# Patient Record
Sex: Female | Born: 1995 | Race: Black or African American | Hispanic: No | Marital: Single | Smoking: Never smoker
Health system: Southern US, Community
[De-identification: ages and names within clinical notes are randomized; demographics above are authoritative.]

## PROBLEM LIST (undated history)

## (undated) ENCOUNTER — Inpatient Hospital Stay (HOSPITAL_COMMUNITY): Payer: Self-pay

## (undated) DIAGNOSIS — K219 Gastro-esophageal reflux disease without esophagitis: Secondary | ICD-10-CM

## (undated) DIAGNOSIS — E119 Type 2 diabetes mellitus without complications: Secondary | ICD-10-CM

## (undated) DIAGNOSIS — R519 Headache, unspecified: Secondary | ICD-10-CM

## (undated) DIAGNOSIS — O24419 Gestational diabetes mellitus in pregnancy, unspecified control: Secondary | ICD-10-CM

## (undated) DIAGNOSIS — R51 Headache: Secondary | ICD-10-CM

## (undated) DIAGNOSIS — N189 Chronic kidney disease, unspecified: Secondary | ICD-10-CM

## (undated) DIAGNOSIS — Z789 Other specified health status: Secondary | ICD-10-CM

## (undated) HISTORY — DX: Other specified health status: Z78.9

## (undated) HISTORY — PX: NO PAST SURGERIES: SHX2092

---

## 2015-12-21 ENCOUNTER — Emergency Department (HOSPITAL_COMMUNITY): Payer: Self-pay

## 2015-12-21 ENCOUNTER — Emergency Department (HOSPITAL_COMMUNITY)
Admission: EM | Admit: 2015-12-21 | Discharge: 2015-12-21 | Disposition: A | Discharge status: Home / Self Care | Payer: Self-pay | Attending: Emergency Medicine | Admitting: Emergency Medicine

## 2015-12-21 ENCOUNTER — Encounter (HOSPITAL_COMMUNITY): Payer: Self-pay

## 2015-12-21 DIAGNOSIS — J039 Acute tonsillitis, unspecified: Secondary | ICD-10-CM | POA: Insufficient documentation

## 2015-12-21 LAB — RAPID STREP SCREEN (MED CTR MEBANE ONLY): Streptococcus, Group A Screen (Direct): NEGATIVE

## 2015-12-21 MED ORDER — IBUPROFEN 600 MG PO TABS: 600.0000 mg | ORAL_TABLET | Freq: Four times a day (QID) | ORAL | 0 refills | Status: DC | PRN

## 2015-12-21 MED ORDER — IOPAMIDOL (ISOVUE-300) INJECTION 61%
100.0000 mL | Freq: Once | INTRAVENOUS | Status: AC | PRN
  Administered 2015-12-21: 75 mL via INTRAVENOUS

## 2015-12-21 MED ORDER — AMOXICILLIN 500 MG PO CAPS: 500.0000 mg | ORAL_CAPSULE | Freq: Three times a day (TID) | ORAL | 0 refills | Status: AC

## 2015-12-21 NOTE — ED Triage Notes (Signed)
Pt reports sore throat, hot and cold flashes, and cough for the past few days.  Denies fever.

## 2015-12-21 NOTE — ED Provider Notes (Signed)
[facility] Provider Note   CSN: [id number] Arrival date & time: 12/21/15  4010  First Provider Contact:  First MD Initiated Contact with Patient 12/21/15 [phone]        History   Chief Complaint Chief Complaint  Patient presents with  . Sore Throat    HPI Alison Sandoval is a 20 y.o. female.  Alison Sandoval is a 20 y.o. Female presenting for evaluation of a recurrent episode of sore throat along with enlarged tonsils which started several days ago.  She denies fevers but has had intermittent hot and cold flashes.  She also endorses clear rhinorrhea with nasal congestion, occasional itchy watery eyes, and shortness of breath along with cough which has been nonproductive.  Her shortness of breath worsens when she is supine and she also describes a foreign body sensation like her throat is closing which worsens when she lies flat.  Past medical history is significant for childhood seasonal allergies and frequent episodes of tonsillitis.  Her last episode was approximately 4 months ago.  She has had no medications for her current symptoms.  She has found no alleviators.       The history is provided by the patient.    History reviewed. No pertinent past medical history.  There are no active problems to display for this patient.   History reviewed. No pertinent surgical history.  OB History    No data available       Home Medications    Prior to Admission medications   Medication Sig Start Date End Date Taking? Authorizing Provider  amoxicillin (AMOXIL) 500 MG capsule Take 1 capsule (500 mg total) by mouth 3 (three) times daily. 12/21/15 12/31/15  Burgess Amor, PA-C  ibuprofen (ADVIL,MOTRIN) 600 MG tablet Take 1 tablet (600 mg total) by mouth every 6 (six) hours as needed. 12/21/15   Burgess Amor, PA-C    Family History No family history on file.  Social History Social History  Substance Use Topics  . Smoking status: Never Smoker  . Smokeless tobacco: Never Used  .  Alcohol use No     Allergies   Review of patient's allergies indicates no known allergies.   Review of Systems Review of Systems  Constitutional: Negative for fever.  HENT: Positive for congestion, postnasal drip, rhinorrhea and sore throat. Negative for ear pain, facial swelling, trouble swallowing and voice change.   Eyes: Negative.   Respiratory: Negative for chest tightness and shortness of breath.   Cardiovascular: Negative for chest pain.  Gastrointestinal: Negative for abdominal pain and nausea.  Genitourinary: Negative.   Musculoskeletal: Negative for arthralgias, joint swelling and neck pain.  Skin: Negative.  Negative for rash and wound.  Neurological: Negative for dizziness, weakness, light-headedness, numbness and headaches.  Psychiatric/Behavioral: Negative.      Physical Exam Updated Vital Signs BP 122/76 (BP Location: Left Arm)   Pulse 102   Temp 97.7 F (36.5 C) (Oral)   Resp 20   Ht  (1.6 m)   Wt 99.3 kg   LMP  (LMP Unknown)   SpO2 94%   BMI 38.79 kg/m   Physical Exam  Constitutional: She is oriented to person, place, and time. She appears well-developed and well-nourished.  HENT:  Head: Normocephalic and atraumatic.  Right Ear: Tympanic membrane and ear canal normal.  Left Ear: Tympanic membrane and ear canal normal.  Nose: Mucosal edema and rhinorrhea present.  Mouth/Throat: Uvula is midline, oropharynx is clear and moist and mucous membranes are normal. No uvula  swelling. No oropharyngeal exudate, posterior oropharyngeal edema, posterior oropharyngeal erythema or tonsillar abscesses. Tonsils are 2+ on the right. Tonsils are 2+ on the left. No tonsillar exudate.  Bilateral symmetric hypertrophied tonsils without erythema or exudate.  Eyes: Conjunctivae are normal.  Cardiovascular: Normal rate and normal heart sounds.   Pulmonary/Chest: Effort normal. No respiratory distress. She has decreased breath sounds. She has no wheezes. She has no  rhonchi. She has no rales.  Decreased breath sounds throughout all lung fields.  No wheezing or rhonchi appreciated.  No stridor.  Abdominal: Soft. There is no tenderness.  Musculoskeletal: Normal range of motion.  Neurological: She is alert and oriented to person, place, and time.  Skin: Skin is warm and dry. No rash noted.  Psychiatric: She has a normal mood and affect.     ED Treatments / Results  Labs (all labs ordered are listed, but only abnormal results are displayed) Labs Reviewed  RAPID STREP SCREEN (NOT AT [facility]  CULTURE, GROUP A STREP [facility])  POC URINE PREG, ED    EKG  EKG Interpretation None       Radiology Dg Neck Soft Tissue  Result Date: 12/21/2015 CLINICAL DATA:  Sore throat. EXAM: NECK SOFT TISSUES - 1+ VIEW COMPARISON:  None. FINDINGS: The vallecular, epiglottis, prevertebral soft tissues, and piriform sinuses are normal. There is rounded soft tissue attenuation along the posterior aspect of the soft palate, in the expected location of the uvula. No other abnormalities. IMPRESSION: Rounded soft tissue attenuation along the posterior aspect of the soft palate. If not artifactual, this should be visible on physical exam. If there is continued concern, CT imaging could better evaluate this region. The vallecular, epiglottis, prevertebral soft tissues, and piriform sinuses are normal. Electronically Signed   By: Gerome Sam III M.D   On: 12/21/2015 11:03  Dg Chest 2 View  Result Date: 12/21/2015 CLINICAL DATA:  Cough and shortness of breath EXAM: CHEST  2 VIEW COMPARISON:  None. FINDINGS: The heart size and mediastinal contours are within normal limits. Both lungs are clear. The visualized skeletal structures are unremarkable. IMPRESSION: No active cardiopulmonary disease. Electronically Signed   By: Gerome Sam III M.D   On: 12/21/2015 10:57  Ct Soft Tissue Neck W Contrast  Result Date: 12/21/2015 CLINICAL DATA:  Globus sensation. EXAM: CT NECK WITH  CONTRAST TECHNIQUE: Multidetector CT imaging of the neck was performed using the standard protocol following the bolus administration of intravenous contrast. CONTRAST:  84mL ISOVUE-300 IOPAMIDOL (ISOVUE-300) INJECTION 61% COMPARISON:  None. FINDINGS: Pharynx and larynx: Marked enlargement of the adenoid and palatine tonsils, symmetric. Negative for abscess. No prevertebral edema. Salivary glands: Negative Thyroid: Negative Lymph nodes: Enlarged and rounded upper cervical lymph nodes without cavitation. Vascular: Negative Limited intracranial: Negative Visualized orbits: Negative Mastoids and visualized paranasal sinuses: Mild scattered mucosal thickening in the paranasal sinuses with mucus/ debris in the right maxillary antrum. Skeleton: Benign sclerosis in the bilateral mandible Upper chest: Prevascular soft tissue density is compatible with thymus. Thickness at the visualized level is allowable for age and there is no discrete nodule. Chest x-ray shows no anterior mediastinal mass or hilar adenopathy. IMPRESSION: Marked tonsillar enlargement with cervical adenopathy. Correlate for URI symptoms and recommend follow-up for stability or normalization. Electronically Signed   By: Marnee Spring M.D.   On: 12/21/2015 12:21   Procedures Procedures (including critical care time)  Medications Ordered in ED Medications  iopamidol (ISOVUE-300) 61 % injection 100 mL (75 mLs Intravenous Contrast Given 12/21/15 1158)  Initial Impression / Assessment and Plan / ED Course  I have reviewed the triage vital signs and the nursing notes.  Pertinent labs & imaging results that were available during my care of the patient were reviewed by me and considered in my medical decision making (see chart for details).  Clinical Course    Plain film imaging reviewed and discussed with Dr. Deretha Emory.  Suspected tonsillar enlargement as source of soft tissue attenuation of plan films.  CT imaging obtained to rule out  abscess or other source of swelling and negative for emergent findings.  She was placed on amoxil, ibuprofen for pain and inflammation.  Prior to dc, mother at bedside confirming frequent tonsil infections and that the patient has a bad snoring habit.  Recommended eval by ENT for further management and to determine if tonsillectomy would be appropriate for this patient.  Mother and pt agree with this plan.  Referral to Dr. Suszanne Conners given.   Final Clinical Impressions(s) / ED Diagnoses   Final diagnoses:  Acute tonsillitis, unspecified etiology    New Prescriptions Discharge Medication List as of 12/21/2015  1:11 PM    START taking these medications   Details  amoxicillin (AMOXIL) 500 MG capsule Take 1 capsule (500 mg total) by mouth 3 (three) times daily., Starting Mon 12/21/2015, Until Thu 12/31/2015, Print    ibuprofen (ADVIL,MOTRIN) 600 MG tablet Take 1 tablet (600 mg total) by mouth every 6 (six) hours as needed., Starting Mon 12/21/2015, Print         Burgess Amor, PA-C 12/21/15 1800    Vanetta Mulders, MD 12/22/15 [phone]

## 2015-12-23 LAB — CULTURE, GROUP A STREP (THRC)

## 2016-05-18 ENCOUNTER — Encounter (HOSPITAL_COMMUNITY): Payer: Self-pay | Admitting: Emergency Medicine

## 2016-05-18 ENCOUNTER — Emergency Department (HOSPITAL_COMMUNITY)
Admission: EM | Admit: 2016-05-18 | Discharge: 2016-05-18 | Disposition: A | Discharge status: Home / Self Care | Payer: Self-pay | Attending: Emergency Medicine | Admitting: Emergency Medicine

## 2016-05-18 DIAGNOSIS — J039 Acute tonsillitis, unspecified: Secondary | ICD-10-CM | POA: Insufficient documentation

## 2016-05-18 MED ORDER — AMOXICILLIN 500 MG PO CAPS: 500.0000 mg | ORAL_CAPSULE | Freq: Three times a day (TID) | ORAL | 0 refills | Status: DC

## 2016-05-18 MED ORDER — AMOXICILLIN 250 MG PO CAPS
500.0000 mg | ORAL_CAPSULE | Freq: Once | ORAL | Status: AC
  Administered 2016-05-18: 500 mg via ORAL
  Filled 2016-05-18: qty 2

## 2016-05-18 MED ORDER — IBUPROFEN 800 MG PO TABS
800.0000 mg | ORAL_TABLET | Freq: Once | ORAL | Status: AC
  Administered 2016-05-18: 800 mg via ORAL
  Filled 2016-05-18: qty 1

## 2016-05-18 MED ORDER — IBUPROFEN 600 MG PO TABS: 600.0000 mg | ORAL_TABLET | Freq: Four times a day (QID) | ORAL | 0 refills | Status: DC | PRN

## 2016-05-18 NOTE — ED Provider Notes (Signed)
[facility] Provider Note   CSN: [id number] Arrival date & time: 05/18/16  1012     History   Chief Complaint Chief Complaint  Patient presents with  . Sore Throat    x 1 week    HPI Alison Sandoval is a 20 y.o. female.  Patient is a 20 year old female who presents to the emergency department with a complaint of sore throat.  Patient states that she has a history of recurrent sore throats. She has been bothered by problems with her tonsils for some time now. She was advised to see an ear nose and throat specialist to see if tonsillectomy and other procedures might be effective. The patient states that she has been told that she snores quite severely. Patient states however that she has a problem with insurance, and has not been able to afford to go see the ear nose and throat specialist.  Over the past week the patient states that she has been having increasing sore throat. She is able to get liquids down. She has had some chills, but has not had measured temperature elevation. She's not had any unusual rash. No neck pain or stiffness, and no joint pain or stiffness. She has had some nasal congestion, but states that this comes and goes. She has tried Tylenol and ibuprofen, but this does not seem to help her symptoms.   The history is provided by the patient.  Sore Throat  Pertinent negatives include no chest pain, no abdominal pain and no shortness of breath.    History reviewed. No pertinent past medical history.  There are no active problems to display for this patient.   History reviewed. No pertinent surgical history.  OB History    No data available       Home Medications    Prior to Admission medications   Medication Sig Start Date End Date Taking? Authorizing Provider  ibuprofen (ADVIL,MOTRIN) 600 MG tablet Take 1 tablet (600 mg total) by mouth every 6 (six) hours as needed. 12/21/15   Burgess AmorJulie Idol, PA-C    Family History History reviewed. No pertinent  family history.  Social History Social History  Substance Use Topics  . Smoking status: Never Smoker  . Smokeless tobacco: Never Used  . Alcohol use No     Allergies   Patient has no known allergies.   Review of Systems Review of Systems  Constitutional: Negative for activity change.       All ROS Neg except as noted in HPI  HENT: Positive for congestion and sore throat. Negative for nosebleeds.   Eyes: Negative for photophobia and discharge.  Respiratory: Negative for cough, shortness of breath and wheezing.   Cardiovascular: Negative for chest pain and palpitations.  Gastrointestinal: Negative for abdominal pain and blood in stool.  Genitourinary: Negative for dysuria, frequency and hematuria.  Musculoskeletal: Negative for arthralgias, back pain and neck pain.  Skin: Negative.   Neurological: Negative for dizziness, seizures and speech difficulty.  Psychiatric/Behavioral: Negative for confusion and hallucinations.     Physical Exam Updated Vital Signs BP 115/64 (BP Location: Right Arm)   Pulse 91   Temp 97.7 F (36.5 C) (Oral)   Resp 18   Ht 5\' 4"  (1.626 m)   Wt 102.5 kg   SpO2 100%   BMI 38.79 kg/m   Physical Exam  Constitutional: She is oriented to person, place, and time. She appears well-developed and well-nourished.  Non-toxic appearance.  HENT:  Head: Normocephalic.  Right Ear: Tympanic membrane and  external ear normal.  Left Ear: Tympanic membrane and external ear normal.  Nasal congestion present  There is minimal increased redness of the posterior pharynx. The tonsils show 2+ enlargement bilaterally. The uvula is slightly swollen, but remains in the midline. There appears to be a small amount of exudate on the posterior portion of the tonsils. No evidence for abscess.  Eyes: EOM and lids are normal. Pupils are equal, round, and reactive to light.  Neck: Normal range of motion. Neck supple. Carotid bruit is not present.  Cardiovascular: Normal rate,  regular rhythm, normal heart sounds, intact distal pulses and normal pulses.   Pulmonary/Chest: Breath sounds normal. No respiratory distress.  Abdominal: Soft. Bowel sounds are normal. There is no tenderness. There is no guarding.  Musculoskeletal: Normal range of motion.  Lymphadenopathy:       Head (right side): No submandibular adenopathy present.       Head (left side): No submandibular adenopathy present.    She has no cervical adenopathy.  Neurological: She is alert and oriented to person, place, and time. She has normal strength. No cranial nerve deficit or sensory deficit.  Skin: Skin is warm and dry.  Psychiatric: She has a normal mood and affect. Her speech is normal.  Nursing note and vitals reviewed.    ED Treatments / Results  Labs (all labs ordered are listed, but only abnormal results are displayed) Labs Reviewed - No data to display  EKG  EKG Interpretation None       Radiology No results found.  Procedures Procedures (including critical care time)  Medications Ordered in ED Medications  amoxicillin (AMOXIL) capsule 500 mg (not administered)  ibuprofen (ADVIL,MOTRIN) tablet 800 mg (not administered)     Initial Impression / Assessment and Plan / ED Course  I have reviewed the triage vital signs and the nursing notes.  Pertinent labs & imaging results that were available during my care of the patient were reviewed by me and considered in my medical decision making (see chart for details).  Clinical Course     *I have reviewed nursing notes, vital signs, and all appropriate lab and imaging results for this patient.**  Final Clinical Impressions(s) / ED Diagnoses  Vital signs within normal limits. Patient has swelling of the tonsils, but able to mobilize secretions, and speak clearly. The patient has been advised to see your nose and throat for evaluation concerning her tonsils, and her recurrent tonsil infections. I have re-emphasized these  instructions. The patient will be treated with Amoxil and ibuprofen. I've asked her to use salt water gargles. Patient acknowledges understanding of these instructions.    Final diagnoses:  None    New Prescriptions New Prescriptions   No medications on file     Ivery QualeHobson Aniken Monestime, PA-C 05/18/16 1138    Raeford RazorStephen Kohut, MD 05/20/16 1149

## 2016-05-18 NOTE — ED Triage Notes (Signed)
Pt presents with recurrent sore throat x 1 week this episode. States was seen here approx 2 months ago and advised she needed her tonsils removed but has not followed up for same.

## 2016-05-18 NOTE — Discharge Instructions (Signed)
Your vital signs are within normal limits. Your oxygen level is 100%. Your examination favors tonsillitis. Please use Amoxil 3 times daily. Use salt water gargles 3-4 times daily. Use ibuprofen every 6 hours for pain, fever, or chills. Please see Dr.Teoh or the ear nose and throat specialist of your choice as soon as possible for evaluation concerning your tonsils and your snoring.

## 2017-03-28 IMAGING — DX DG CHEST 2V
2 series · 2 of 2 positions shown · non-contrast
Comparison: None.

CLINICAL DATA: Cough and shortness of breath

EXAM:
CHEST  2 VIEW

[chest pa]
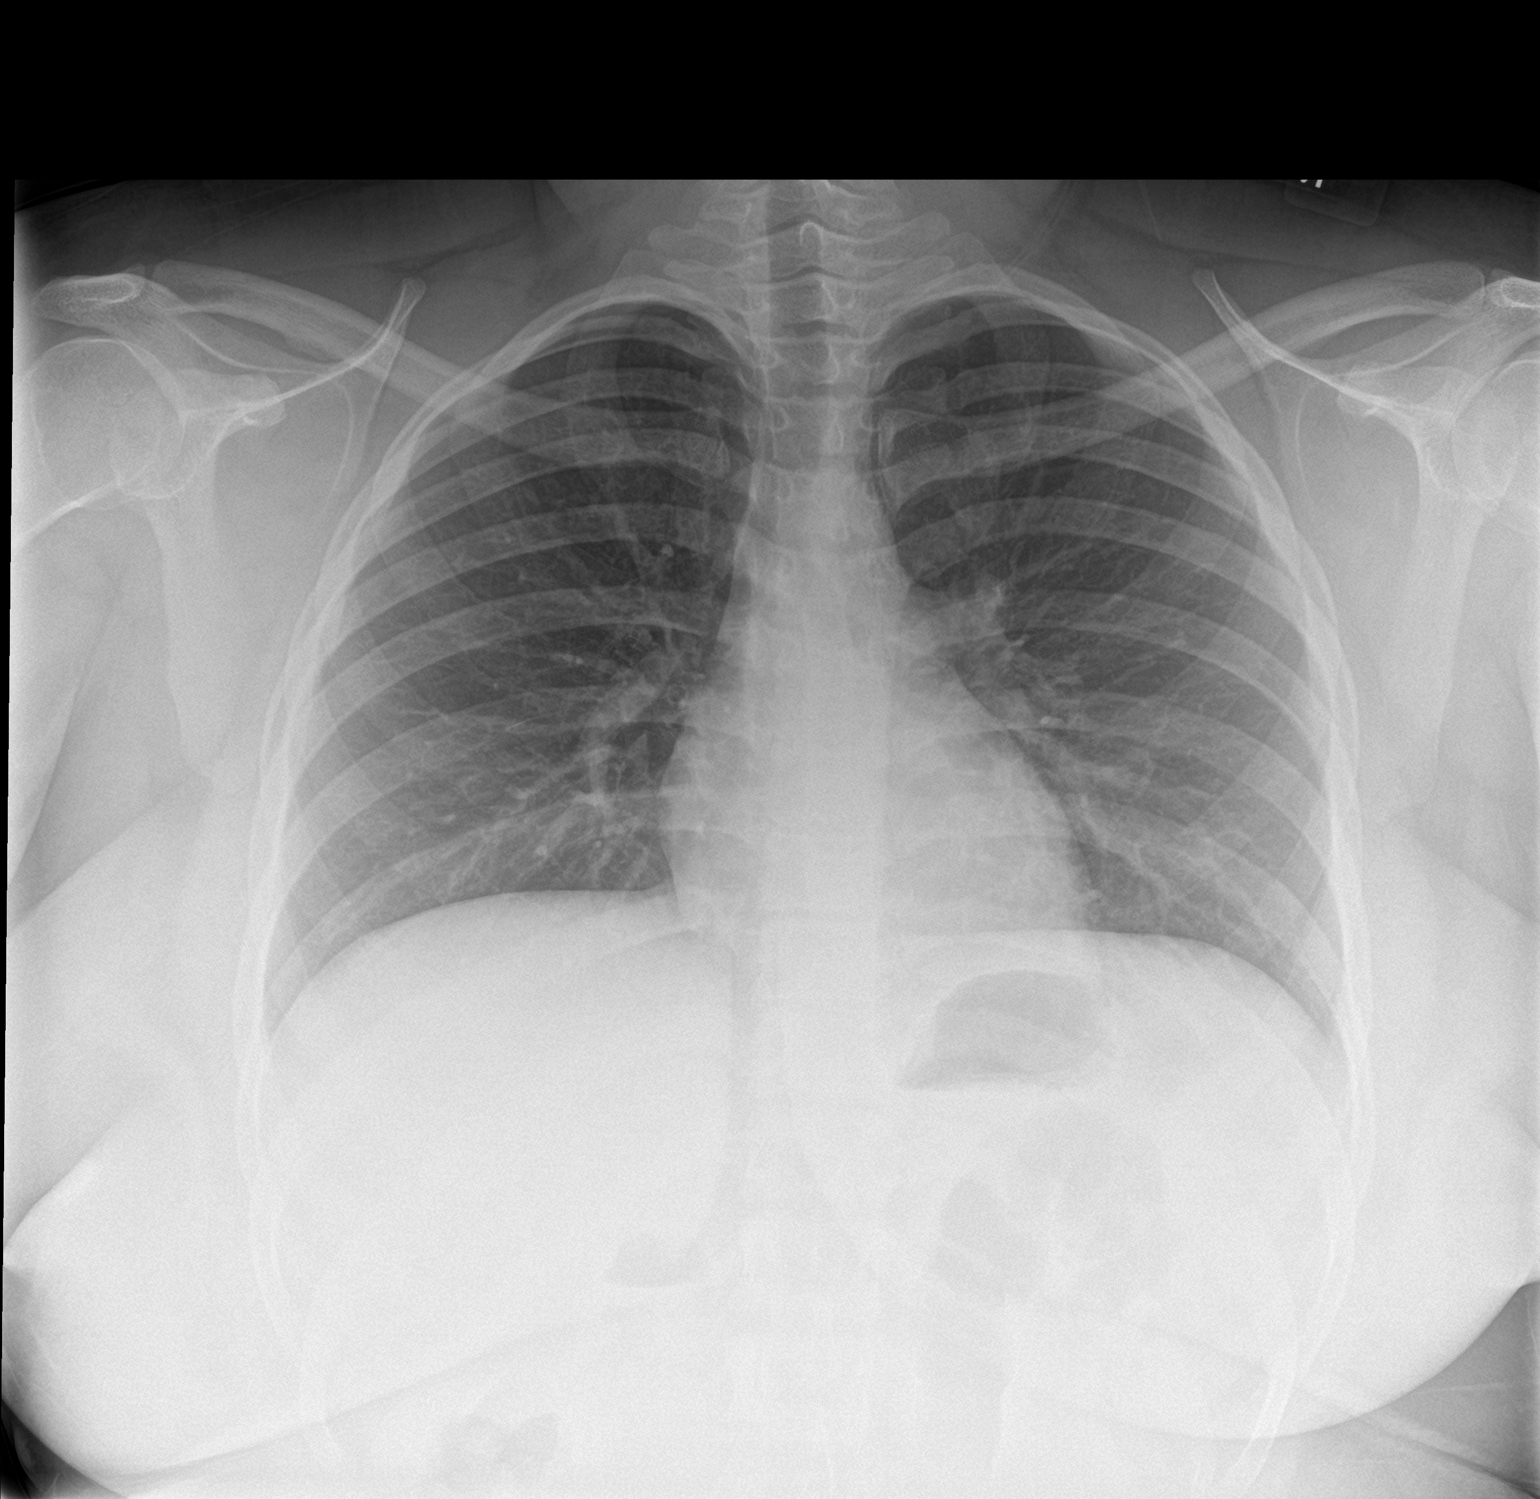

[chest lat]
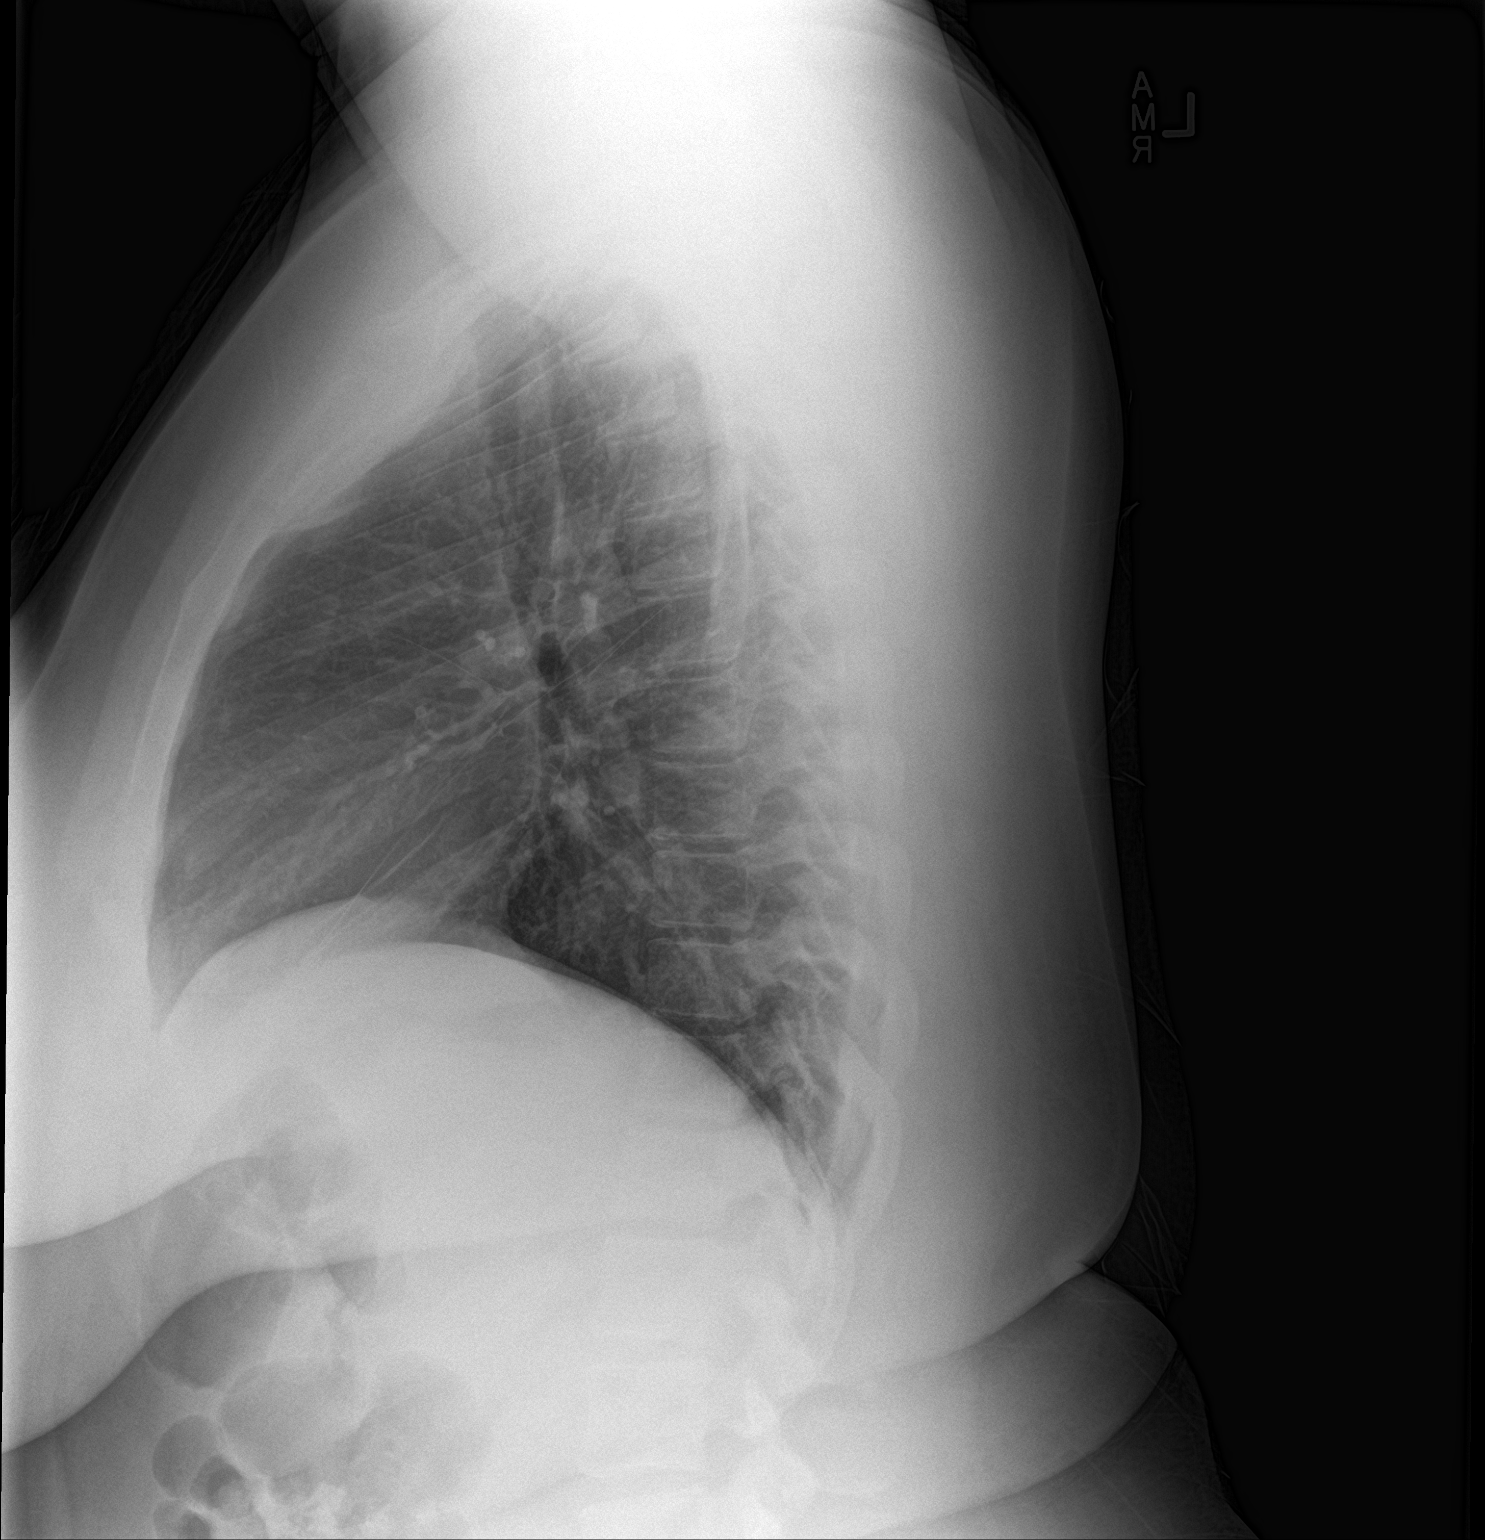

[2 of 2 positions shown; findings below may reference images not displayed]

FINDINGS: The heart size and mediastinal contours are within normal limits.
Both lungs are clear. The visualized skeletal structures are
unremarkable.
IMPRESSION: No active cardiopulmonary disease.

## 2017-10-02 ENCOUNTER — Telehealth: Payer: Self-pay | Admitting: Obstetrics & Gynecology

## 2017-10-02 NOTE — Telephone Encounter (Signed)
Pt called stating that she had a positive pregnancy test and is having some cramping. Advised pt that some cramping in pregnancy can be normal. Pt states that she is not having any bleeding. Advised to push fluids and rest as much as possible. Advised to keep her appt on 5/21. Advised to call back with any other concerns. Pt verbalized understanding.

## 2017-10-05 ENCOUNTER — Emergency Department (HOSPITAL_COMMUNITY)
Admission: EM | Admit: 2017-10-05 | Discharge: 2017-10-05 | Disposition: A | Discharge status: Home / Self Care | Payer: BLUE CROSS/BLUE SHIELD | Attending: Emergency Medicine | Admitting: Emergency Medicine

## 2017-10-05 ENCOUNTER — Encounter (HOSPITAL_COMMUNITY): Payer: Self-pay | Admitting: Emergency Medicine

## 2017-10-05 ENCOUNTER — Emergency Department (HOSPITAL_COMMUNITY): Payer: BLUE CROSS/BLUE SHIELD

## 2017-10-05 ENCOUNTER — Other Ambulatory Visit: Payer: Self-pay

## 2017-10-05 DIAGNOSIS — Z5321 Procedure and treatment not carried out due to patient leaving prior to being seen by health care provider: Secondary | ICD-10-CM | POA: Diagnosis not present

## 2017-10-05 DIAGNOSIS — R102 Pelvic and perineal pain: Secondary | ICD-10-CM | POA: Insufficient documentation

## 2017-10-05 NOTE — ED Notes (Signed)
Pt told registration she was leaving and going to [facility]

## 2017-10-05 NOTE — ED Triage Notes (Addendum)
Pt reports she is having R sided pelvic pain, recently found out she was pregnant. LMP 09/04/2017?, states pain began a few days ago and feels like cramping, increased in pain. Denies vaginal d/c or bleeding.

## 2017-10-10 ENCOUNTER — Encounter: Payer: Self-pay | Admitting: Adult Health

## 2017-10-10 ENCOUNTER — Ambulatory Visit: Payer: BLUE CROSS/BLUE SHIELD | Admitting: Adult Health

## 2017-10-10 VITALS — BP 110/80 | HR 98 | Ht 64.0 in | Wt 240.0 lb

## 2017-10-10 DIAGNOSIS — Z3490 Encounter for supervision of normal pregnancy, unspecified, unspecified trimester: Secondary | ICD-10-CM

## 2017-10-10 DIAGNOSIS — O3680X0 Pregnancy with inconclusive fetal viability, not applicable or unspecified: Secondary | ICD-10-CM

## 2017-10-10 DIAGNOSIS — R110 Nausea: Secondary | ICD-10-CM

## 2017-10-10 DIAGNOSIS — N926 Irregular menstruation, unspecified: Secondary | ICD-10-CM | POA: Diagnosis not present

## 2017-10-10 MED ORDER — PRENATAL PLUS 27-1 MG PO TABS: 1.0000 | ORAL_TABLET | Freq: Every day | ORAL | 12 refills | Status: DC

## 2017-10-10 MED ORDER — PRENATAL PLUS 27-1 MG PO TABS: 1.0000 | ORAL_TABLET | Freq: Every day | ORAL | 0 refills | Status: DC

## 2017-10-10 NOTE — Patient Instructions (Signed)
First Trimester of Pregnancy The first trimester of pregnancy is from week 1 until the end of week 13 (months 1 through 3). A week after a sperm fertilizes an egg, the egg will implant on the wall of the uterus. This embryo will begin to develop into a baby. Genes from you and your partner will form the baby. The female genes will determine whether the baby will be a boy or a girl. At 6-8 weeks, the eyes and face will be formed, and the heartbeat can be seen on ultrasound. At the end of 12 weeks, all the baby's organs will be formed. Now that you are pregnant, you will want to do everything you can to have a healthy baby. Two of the most important things are to get good prenatal care and to follow your health care provider's instructions. Prenatal care is all the medical care you receive before the baby's birth. This care will help prevent, find, and treat any problems during the pregnancy and childbirth. Body changes during your first trimester Your body goes through many changes during pregnancy. The changes vary from woman to woman.  You may gain or lose a couple of pounds at first.  You may feel sick to your stomach (nauseous) and you may throw up (vomit). If the vomiting is uncontrollable, call your health care provider.  You may tire easily.  You may develop headaches that can be relieved by medicines. All medicines should be approved by your health care provider.  You may urinate more often. Painful urination may mean you have a bladder infection.  You may develop heartburn as a result of your pregnancy.  You may develop constipation because certain hormones are causing the muscles that push stool through your intestines to slow down.  You may develop hemorrhoids or swollen veins (varicose veins).  Your breasts may begin to grow larger and become tender. Your nipples may stick out more, and the tissue that surrounds them (areola) may become darker.  Your gums may bleed and may be  sensitive to brushing and flossing.  Dark spots or blotches (chloasma, mask of pregnancy) may develop on your face. This will likely fade after the baby is born.  Your menstrual periods will stop.  You may have a loss of appetite.  You may develop cravings for certain kinds of food.  You may have changes in your emotions from day to day, such as being excited to be pregnant or being concerned that something may go wrong with the pregnancy and baby.  You may have more vivid and strange dreams.  You may have changes in your hair. These can include thickening of your hair, rapid growth, and changes in texture. Some women also have hair loss during or after pregnancy, or hair that feels dry or thin. Your hair will most likely return to normal after your baby is born.  What to expect at prenatal visits During a routine prenatal visit:  You will be weighed to make sure you and the baby are growing normally.  Your blood pressure will be taken.  Your abdomen will be measured to track your baby's growth.  The fetal heartbeat will be listened to between weeks 10 and 14 of your pregnancy.  Test results from any previous visits will be discussed.  Your health care provider may ask you:  How you are feeling.  If you are feeling the baby move.  If you have had any abnormal symptoms, such as leaking fluid, bleeding, severe headaches,   or abdominal cramping.  If you are using any tobacco products, including cigarettes, chewing tobacco, and electronic cigarettes.  If you have any questions.  Other tests that may be performed during your first trimester include:  Blood tests to find your blood type and to check for the presence of any previous infections. The tests will also be used to check for low iron levels (anemia) and protein on red blood cells (Rh antibodies). Depending on your risk factors, or if you previously had diabetes during pregnancy, you may have tests to check for high blood  sugar that affects pregnant women (gestational diabetes).  Urine tests to check for infections, diabetes, or protein in the urine.  An ultrasound to confirm the proper growth and development of the baby.  Fetal screens for spinal cord problems (spina bifida) and Down syndrome.  HIV (human immunodeficiency virus) testing. Routine prenatal testing includes screening for HIV, unless you choose not to have this test.  You may need other tests to make sure you and the baby are doing well.  Follow these instructions at home: Medicines  Follow your health care provider's instructions regarding medicine use. Specific medicines may be either safe or unsafe to take during pregnancy.  Take a prenatal vitamin that contains at least 600 micrograms (mcg) of folic acid.  If you develop constipation, try taking a stool softener if your health care provider approves. Eating and drinking  Eat a balanced diet that includes fresh fruits and vegetables, whole grains, good sources of protein such as meat, eggs, or tofu, and low-fat dairy. Your health care provider will help you determine the amount of weight gain that is right for you.  Avoid raw meat and uncooked cheese. These carry germs that can cause birth defects in the baby.  Eating four or five small meals rather than three large meals a day may help relieve nausea and vomiting. If you start to feel nauseous, eating a few soda crackers can be helpful. Drinking liquids between meals, instead of during meals, also seems to help ease nausea and vomiting.  Limit foods that are high in fat and processed sugars, such as fried and sweet foods.  To prevent constipation: ? Eat foods that are high in fiber, such as fresh fruits and vegetables, whole grains, and beans. ? Drink enough fluid to keep your urine clear or pale yellow. Activity  Exercise only as directed by your health care provider. Most women can continue their usual exercise routine during  pregnancy. Try to exercise for 30 minutes at least 5 days a week. Exercising will help you: ? Control your weight. ? Stay in shape. ? Be prepared for labor and delivery.  Experiencing pain or cramping in the lower abdomen or lower back is a good sign that you should stop exercising. Check with your health care provider before continuing with normal exercises.  Try to avoid standing for long periods of time. Move your legs often if you must stand in one place for a long time.  Avoid heavy lifting.  Wear low-heeled shoes and practice good posture.  You may continue to have sex unless your health care provider tells you not to. Relieving pain and discomfort  Wear a good support bra to relieve breast tenderness.  Take warm sitz baths to soothe any pain or discomfort caused by hemorrhoids. Use hemorrhoid cream if your health care provider approves.  Rest with your legs elevated if you have leg cramps or low back pain.  If you develop   varicose veins in your legs, wear support hose. Elevate your feet for 15 minutes, 3-4 times a day. Limit salt in your diet. Prenatal care  Schedule your prenatal visits by the twelfth week of pregnancy. They are usually scheduled monthly at first, then more often in the last 2 months before delivery.  Write down your questions. Take them to your prenatal visits.  Keep all your prenatal visits as told by your health care provider. This is important. Safety  Wear your seat belt at all times when driving.  Make a list of emergency phone numbers, including numbers for family, friends, the hospital, and police and fire departments. General instructions  Ask your health care provider for a referral to a local prenatal education class. Begin classes no later than the beginning of month 6 of your pregnancy.  Ask for help if you have counseling or nutritional needs during pregnancy. Your health care provider can offer advice or refer you to specialists for help  with various needs.  Do not use hot tubs, steam rooms, or saunas.  Do not douche or use tampons or scented sanitary pads.  Do not cross your legs for long periods of time.  Avoid cat litter boxes and soil used by cats. These carry germs that can cause birth defects in the baby and possibly loss of the fetus by miscarriage or stillbirth.  Avoid all smoking, herbs, alcohol, and medicines not prescribed by your health care provider. Chemicals in these products affect the formation and growth of the baby.  Do not use any products that contain nicotine or tobacco, such as cigarettes and e-cigarettes. If you need help quitting, ask your health care provider. You may receive counseling support and other resources to help you quit.  Schedule a dentist appointment. At home, brush your teeth with a soft toothbrush and be gentle when you floss. Contact a health care provider if:  You have dizziness.  You have mild pelvic cramps, pelvic pressure, or nagging pain in the abdominal area.  You have persistent nausea, vomiting, or diarrhea.  You have a bad smelling vaginal discharge.  You have pain when you urinate.  You notice increased swelling in your face, hands, legs, or ankles.  You are exposed to fifth disease or chickenpox.  You are exposed to German measles (rubella) and have never had it. Get help right away if:  You have a fever.  You are leaking fluid from your vagina.  You have spotting or bleeding from your vagina.  You have severe abdominal cramping or pain.  You have rapid weight gain or loss.  You vomit blood or material that looks like coffee grounds.  You develop a severe headache.  You have shortness of breath.  You have any kind of trauma, such as from a fall or a car accident. Summary  The first trimester of pregnancy is from week 1 until the end of week 13 (months 1 through 3).  Your body goes through many changes during pregnancy. The changes vary from  woman to woman.  You will have routine prenatal visits. During those visits, your health care provider will examine you, discuss any test results you may have, and talk with you about how you are feeling. This information is not intended to replace advice given to you by your health care provider. Make sure you discuss any questions you have with your health care provider. Document Released: 05/03/2001 Document Revised: 04/20/2016 Document Reviewed: 04/20/2016 Elsevier Interactive Patient Education  2018 [employer]  

## 2017-10-10 NOTE — Progress Notes (Signed)
  Subjective:     Patient ID: Alison Sandoval, female   DOB: [birth year], 22 y.o.   MRN: [id number]  HPI Alison Sandoval is a 22 year old black female, for follow up after ER visit at [facility] for spotting in early pregnancy.Bleeding has stopped. Her QHCG was 294 on 5 /16 and then 499.5 on 10/07/17. She works at [employer] in [place].  Review of Systems +missed period with +HPT Had spotting but stopped +nasuea  Reviewed past medical,surgical, social and family history. Reviewed medications and allergies.     Objective:   Physical Exam BP 110/80 (BP Location: Left Arm, Patient Position: Sitting, Cuff Size: Large)   Pulse 98   Ht  (1.626 m)   Wt 240 lb (108.9 kg)   LMP 09/07/2017 (Approximate)   BMI 41.20 kg/m    Skin warm and dry. Neck: mid line trachea, normal thyroid, good ROM, no lymphadenopathy noted. Lungs: clear to ausculation bilaterally. Cardiovascular: regular rate and rhythm. Discussed no sex til 7 days after wiping color, no lifting greater than 25 lbs. Will recheck [facility] and will get progesterone. And schedule [place]. 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   Assessment:     1. Pregnancy, unspecified gestational age   22. Encounter to determine fetal viability of pregnancy, single or unspecified fetus       Plan:    Eat often  Meds ordered this encounter  Medications  . DISCONTD: prenatal vitamin w/FE, FA (PRENATAL 1 + 1) 27-1 MG TABS tablet    Sig: Take 1 tablet by mouth daily at 12 noon.    Dispense:  30 each    Refill:  0    Order Specific Question:   Supervising Provider    Answer:   Despina Hidden, LUTHER H [[id number]]  . prenatal vitamin w/FE, FA (PRENATAL 1 + 1) 27-1 MG TABS tablet    Sig: Take 1 tablet by mouth daily at 12 noon.    Dispense:  30 each    Refill:  12    Order Specific Question:   Supervising Provider    Answer:   Duane Lope H [[id number]]  Check QHCG and progesterone level Return in 2 weeks for dating [place] Review handout on First trimester and by  [facility]

## 2017-10-11 ENCOUNTER — Telehealth: Payer: Self-pay | Admitting: Adult Health

## 2017-10-11 DIAGNOSIS — Z3490 Encounter for supervision of normal pregnancy, unspecified, unspecified trimester: Secondary | ICD-10-CM

## 2017-10-11 LAB — PROGESTERONE: PROGESTERONE: 8.8 ng/mL

## 2017-10-11 LAB — BETA HCG QUANT (REF LAB): hCG Quant: 2199 m[IU]/mL

## 2017-10-11 MED ORDER — PROGESTERONE MICRONIZED 200 MG PO CAPS: ORAL_CAPSULE | ORAL | 3 refills | Status: DC

## 2017-10-11 NOTE — Telephone Encounter (Signed)
Pt aware of QHCG, which is rising and that progesterone level is low, will rx Prometrium 200 mg in vagina at HS and recheck [facility] in am

## 2017-10-13 LAB — BETA HCG QUANT (REF LAB): HCG QUANT: 5064 m[IU]/mL

## 2017-10-24 ENCOUNTER — Ambulatory Visit (INDEPENDENT_AMBULATORY_CARE_PROVIDER_SITE_OTHER): Payer: BLUE CROSS/BLUE SHIELD

## 2017-10-24 DIAGNOSIS — Z3A01 Less than 8 weeks gestation of pregnancy: Secondary | ICD-10-CM | POA: Diagnosis not present

## 2017-10-24 DIAGNOSIS — O208 Other hemorrhage in early pregnancy: Secondary | ICD-10-CM

## 2017-10-24 DIAGNOSIS — O3680X0 Pregnancy with inconclusive fetal viability, not applicable or unspecified: Secondary | ICD-10-CM | POA: Diagnosis not present

## 2017-10-24 NOTE — Progress Notes (Signed)
US 6+5 wks,single IUP w/ys,positive fht 131 bpm,normal ovaries bilat,crl 7.45 mm.small subchorionic hemorrhage 1.2 x .3 x .7 cm,EDD 06/14/2018 by LMP

## 2017-11-07 ENCOUNTER — Ambulatory Visit (INDEPENDENT_AMBULATORY_CARE_PROVIDER_SITE_OTHER): Payer: BLUE CROSS/BLUE SHIELD | Admitting: Advanced Practice Midwife

## 2017-11-07 ENCOUNTER — Ambulatory Visit: Payer: BLUE CROSS/BLUE SHIELD | Admitting: *Deleted

## 2017-11-07 ENCOUNTER — Encounter: Payer: Self-pay | Admitting: Advanced Practice Midwife

## 2017-11-07 VITALS — BP 116/71 | HR 91 | Wt 235.0 lb

## 2017-11-07 DIAGNOSIS — O0990 Supervision of high risk pregnancy, unspecified, unspecified trimester: Secondary | ICD-10-CM | POA: Insufficient documentation

## 2017-11-07 DIAGNOSIS — O9989 Other specified diseases and conditions complicating pregnancy, childbirth and the puerperium: Secondary | ICD-10-CM | POA: Diagnosis not present

## 2017-11-07 DIAGNOSIS — O99611 Diseases of the digestive system complicating pregnancy, first trimester: Secondary | ICD-10-CM | POA: Diagnosis not present

## 2017-11-07 DIAGNOSIS — R110 Nausea: Secondary | ICD-10-CM | POA: Diagnosis not present

## 2017-11-07 DIAGNOSIS — Z3A08 8 weeks gestation of pregnancy: Secondary | ICD-10-CM | POA: Diagnosis not present

## 2017-11-07 DIAGNOSIS — K5900 Constipation, unspecified: Secondary | ICD-10-CM | POA: Diagnosis not present

## 2017-11-07 DIAGNOSIS — Z1389 Encounter for screening for other disorder: Secondary | ICD-10-CM

## 2017-11-07 DIAGNOSIS — Z3682 Encounter for antenatal screening for nuchal translucency: Secondary | ICD-10-CM

## 2017-11-07 DIAGNOSIS — R5383 Other fatigue: Secondary | ICD-10-CM

## 2017-11-07 DIAGNOSIS — Z331 Pregnant state, incidental: Secondary | ICD-10-CM

## 2017-11-07 DIAGNOSIS — Z3401 Encounter for supervision of normal first pregnancy, first trimester: Secondary | ICD-10-CM

## 2017-11-07 LAB — POCT URINALYSIS DIPSTICK
Blood, UA: NEGATIVE
GLUCOSE UA: NEGATIVE
KETONES UA: NEGATIVE
Leukocytes, UA: NEGATIVE
Nitrite, UA: NEGATIVE
Protein, UA: NEGATIVE

## 2017-11-07 NOTE — Patient Instructions (Signed)
 First Trimester of Pregnancy The first trimester of pregnancy is from week 1 until the end of week 12 (months 1 through 3). A week after a sperm fertilizes an egg, the egg will implant on the wall of the uterus. This embryo will begin to develop into a baby. Genes from you and your partner are forming the baby. The female genes determine whether the baby is a boy or a girl. At 6-8 weeks, the eyes and face are formed, and the heartbeat can be seen on ultrasound. At the end of 12 weeks, all the baby's organs are formed.  Now that you are pregnant, you will want to do everything you can to have a healthy baby. Two of the most important things are to get good prenatal care and to follow your health care provider's instructions. Prenatal care is all the medical care you receive before the baby's birth. This care will help prevent, find, and treat any problems during the pregnancy and childbirth. BODY CHANGES Your body goes through many changes during pregnancy. The changes vary from woman to woman.   You may gain or lose a couple of pounds at first.  You may feel sick to your stomach (nauseous) and throw up (vomit). If the vomiting is uncontrollable, call your health care provider.  You may tire easily.  You may develop headaches that can be relieved by medicines approved by your health care provider.  You may urinate more often. Painful urination may mean you have a bladder infection.  You may develop heartburn as a result of your pregnancy.  You may develop constipation because certain hormones are causing the muscles that push waste through your intestines to slow down.  You may develop hemorrhoids or swollen, bulging veins (varicose veins).  Your breasts may begin to grow larger and become tender. Your nipples may stick out more, and the tissue that surrounds them (areola) may become darker.  Your gums may bleed and may be sensitive to brushing and flossing.  Dark spots or blotches  (chloasma, mask of pregnancy) may develop on your face. This will likely fade after the baby is born.  Your menstrual periods will stop.  You may have a loss of appetite.  You may develop cravings for certain kinds of food.  You may have changes in your emotions from day to day, such as being excited to be pregnant or being concerned that something may go wrong with the pregnancy and baby.  You may have more vivid and strange dreams.  You may have changes in your hair. These can include thickening of your hair, rapid growth, and changes in texture. Some women also have hair loss during or after pregnancy, or hair that feels dry or thin. Your hair will most likely return to normal after your baby is born. WHAT TO EXPECT AT YOUR PRENATAL VISITS During a routine prenatal visit:  You will be weighed to make sure you and the baby are growing normally.  Your blood pressure will be taken.  Your abdomen will be measured to track your baby's growth.  The fetal heartbeat will be listened to starting around week 10 or 12 of your pregnancy.  Test results from any previous visits will be discussed. Your health care provider may ask you:  How you are feeling.  If you are feeling the baby move.  If you have had any abnormal symptoms, such as leaking fluid, bleeding, severe headaches, or abdominal cramping.  If you have any questions. Other   tests that may be performed during your first trimester include:  Blood tests to find your blood type and to check for the presence of any previous infections. They will also be used to check for low iron levels (anemia) and Rh antibodies. Later in the pregnancy, blood tests for diabetes will be done along with other tests if problems develop.  Urine tests to check for infections, diabetes, or protein in the urine.  An ultrasound to confirm the proper growth and development of the baby.  An amniocentesis to check for possible genetic problems.  Fetal  screens for spina bifida and Down syndrome.  You may need other tests to make sure you and the baby are doing well. HOME CARE INSTRUCTIONS  Medicines  Follow your health care provider's instructions regarding medicine use. Specific medicines may be either safe or unsafe to take during pregnancy.  Take your prenatal vitamins as directed.  If you develop constipation, try taking a stool softener if your health care provider approves. Diet  Eat regular, well-balanced meals. Choose a variety of foods, such as meat or vegetable-based protein, fish, milk and low-fat dairy products, vegetables, fruits, and whole grain breads and cereals. Your health care provider will help you determine the amount of weight gain that is right for you.  Avoid raw meat and uncooked cheese. These carry germs that can cause birth defects in the baby.  Eating four or five small meals rather than three large meals a day may help relieve nausea and vomiting. If you start to feel nauseous, eating a few soda crackers can be helpful. Drinking liquids between meals instead of during meals also seems to help nausea and vomiting.  If you develop constipation, eat more high-fiber foods, such as fresh vegetables or fruit and whole grains. Drink enough fluids to keep your urine clear or pale yellow. Activity and Exercise  Exercise only as directed by your health care provider. Exercising will help you:  Control your weight.  Stay in shape.  Be prepared for labor and delivery.  Experiencing pain or cramping in the lower abdomen or low back is a good sign that you should stop exercising. Check with your health care provider before continuing normal exercises.  Try to avoid standing for long periods of time. Move your legs often if you must stand in one place for a long time.  Avoid heavy lifting.  Wear low-heeled shoes, and practice good posture.  You may continue to have sex unless your health care provider directs you  otherwise. Relief of Pain or Discomfort  Wear a good support bra for breast tenderness.   Take warm sitz baths to soothe any pain or discomfort caused by hemorrhoids. Use hemorrhoid cream if your health care provider approves.   Rest with your legs elevated if you have leg cramps or low back pain.  If you develop varicose veins in your legs, wear support hose. Elevate your feet for 15 minutes, 3-4 times a day. Limit salt in your diet. Prenatal Care  Schedule your prenatal visits by the twelfth week of pregnancy. They are usually scheduled monthly at first, then more often in the last 2 months before delivery.  Write down your questions. Take them to your prenatal visits.  Keep all your prenatal visits as directed by your health care provider. Safety  Wear your seat belt at all times when driving.  Make a list of emergency phone numbers, including numbers for family, friends, the hospital, and police and fire departments. General   Tips  Ask your health care provider for a referral to a local prenatal education class. Begin classes no later than at the beginning of month 6 of your pregnancy.  Ask for help if you have counseling or nutritional needs during pregnancy. Your health care provider can offer advice or refer you to specialists for help with various needs.  Do not use hot tubs, steam rooms, or saunas.  Do not douche or use tampons or scented sanitary pads.  Do not cross your legs for long periods of time.  Avoid cat litter boxes and soil used by cats. These carry germs that can cause birth defects in the baby and possibly loss of the fetus by miscarriage or stillbirth.  Avoid all smoking, herbs, alcohol, and medicines not prescribed by your health care provider. Chemicals in these affect the formation and growth of the baby.  Schedule a dentist appointment. At home, brush your teeth with a soft toothbrush and be gentle when you floss. SEEK MEDICAL CARE IF:   You have  dizziness.  You have mild pelvic cramps, pelvic pressure, or nagging pain in the abdominal area.  You have persistent nausea, vomiting, or diarrhea.  You have a bad smelling vaginal discharge.  You have pain with urination.  You notice increased swelling in your face, hands, legs, or ankles. SEEK IMMEDIATE MEDICAL CARE IF:   You have a fever.  You are leaking fluid from your vagina.  You have spotting or bleeding from your vagina.  You have severe abdominal cramping or pain.  You have rapid weight gain or loss.  You vomit blood or material that looks like coffee grounds.  You are exposed to German measles and have never had them.  You are exposed to fifth disease or chickenpox.  You develop a severe headache.  You have shortness of breath.  You have any kind of trauma, such as from a fall or a car accident. Document Released: 05/03/2001 Document Revised: 09/23/2013 Document Reviewed: 03/19/2013 [employer] Patient Information 2015 [employer]. This information is not intended to replace advice given to you by your health care provider. Make sure you discuss any questions you have with your health care provider.   Nausea & Vomiting  Have saltine crackers or pretzels by your bed and eat a few bites before you raise your head out of bed in the morning  Eat small frequent meals throughout the day instead of large meals  Drink plenty of fluids throughout the day to stay hydrated, just don't drink a lot of fluids with your meals.  This can make your stomach fill up faster making you feel sick  Do not brush your teeth right after you eat  Products with real ginger are good for nausea, like ginger ale and ginger hard candy Make sure it says made with real ginger!  Sucking on sour candy like lemon heads is also good for nausea  If your prenatal vitamins make you nauseated, take them at night so you will sleep through the nausea  Sea Bands  If you feel like you need  medicine for the nausea & vomiting please let us know  If you are unable to keep any fluids or food down please let us know   Constipation  Drink plenty of fluid, preferably water, throughout the day  Eat foods high in fiber such as fruits, vegetables, and grains  Exercise, such as walking, is a good way to keep your bowels regular  Drink warm fluids, especially warm   prune juice, or decaf coffee  Eat a 1/2 cup of real oatmeal (not instant), 1/2 cup applesauce, and 1/2-1 cup warm prune juice every day  If needed, you may take Colace (docusate sodium) stool softener once or twice a day to help keep the stool soft. If you are pregnant, wait until you are out of your first trimester (12-14 weeks of pregnancy)  If you still are having problems with constipation, you may take Miralax once daily as needed to help keep your bowels regular.  If you are pregnant, wait until you are out of your first trimester (12-14 weeks of pregnancy)  Safe Medications in Pregnancy   Acne: Benzoyl Peroxide Salicylic Acid  Backache/Headache: Tylenol: 2 regular strength every 4 hours OR              2 Extra strength every 6 hours  Colds/Coughs/Allergies: Benadryl (alcohol free) 25 mg every 6 hours as needed Breath right strips Claritin Cepacol throat lozenges Chloraseptic throat spray Cold-Eeze- up to three times per day Cough drops, alcohol free Flonase (by prescription only) Guaifenesin Mucinex Robitussin DM (plain only, alcohol free) Saline nasal spray/drops Sudafed (pseudoephedrine) & Actifed ** use only after [redacted] weeks gestation and if you do not have high blood pressure Tylenol Vicks Vaporub Zinc lozenges Zyrtec   Constipation: Colace Ducolax suppositories Fleet enema Glycerin suppositories Metamucil Milk of magnesia Miralax Senokot Smooth move tea  Diarrhea: Kaopectate Imodium A-D  *NO pepto Bismol  Hemorrhoids: Anusol Anusol HC Preparation  H Tucks  Indigestion: Tums Maalox Mylanta Zantac  Pepcid  Insomnia: Benadryl (alcohol free) 25mg every 6 hours as needed Tylenol PM Unisom, no Gelcaps  Leg Cramps: Tums MagGel  Nausea/Vomiting:  Bonine Dramamine Emetrol Ginger extract Sea bands Meclizine  Nausea medication to take during pregnancy:  Unisom (doxylamine succinate 25 mg tablets) Take one tablet daily at bedtime. If symptoms are not adequately controlled, the dose can be increased to a maximum recommended dose of two tablets daily (1/2 tablet in the morning, 1/2 tablet mid-afternoon and one at bedtime). Vitamin B6 100mg tablets. Take one tablet twice a day (up to 200 mg per day).  Skin Rashes: Aveeno products Benadryl cream or 25mg every 6 hours as needed Calamine Lotion 1% cortisone cream  Yeast infection: Gyne-lotrimin 7 Monistat 7   **If taking multiple medications, please check labels to avoid duplicating the same active ingredients **take medication as directed on the label ** Do not exceed 4000 mg of tylenol in 24 hours **Do not take medications that contain aspirin or ibuprofen      

## 2017-11-07 NOTE — Progress Notes (Signed)
  Subjective:    Alison Sandoval is a G1P0 7722w5d being seen today for her first obstetrical visit.  Her obstetrical history is significant for first pregnancy.  Pregnancy history fully reviewed.  Patient reports fatigue, some nausea (declines meds) constipation Try fiber. .Tylenol OK  Vitals:   11/07/17 1425  BP: 116/71  Pulse: 91  Weight: 235 lb (106.6 kg)    HISTORY: OB History  Gravida Para Term Preterm AB Living  1            SAB TAB Ectopic Multiple Live Births               # Outcome Date GA Lbr Len/2nd Weight Sex Delivery Anes PTL Lv  1 Current            Past Medical History:  Diagnosis Date  . Medical history non-contributory    Past Surgical History:  Procedure Laterality Date  . NO PAST SURGERIES     Family History  Problem Relation Age of Onset  . Hypertension Paternal Grandmother   . Hyperlipidemia Maternal Grandmother   . Hypertension Father   . Hyperlipidemia Father   . Hypertension Mother      Exam                                      System:     Skin: normal coloration and turgor, no rashes    Neurologic: oriented, normal, normal mood   Extremities: normal strength, tone, and muscle mass   HEENT PERRLA   Mouth/Teeth mucous membranes moist, normal dentition   Neck supple and no masses   Cardiovascular: regular rate and rhythm   Respiratory:  appears well, vitals normal, no respiratory distress, acyanotic   Abdomen: soft, non-tender;  FHR: 160US        The nature of [facility] with multiple MDs and other Advanced Practice Providers was explained to patient; also emphasized that residents, students are part of our team.  Assessment:    Pregnancy: G1P0 Patient Active Problem List   Diagnosis Date Noted  . Supervision of normal first pregnancy 11/07/2017        Plan:     Initial labs drawn. Continue prenatal vitamins  Problem list reviewed and updated  Reviewed n/v relief measures and  warning s/s to report  Reviewed recommended weight gain based on pre-gravid BMI  Encouraged well-balanced diet Genetic Screening discussed Integrated Screen: requested.  Ultrasound discussed; fetal survey: requested.  Return in about 1 month (around 12/05/2017) for US:NT+1st IT, LROB.  Jacklyn ShellFrances Cresenzo-Dishmon 11/07/2017

## 2017-11-08 LAB — PMP SCREEN PROFILE (10S), URINE
AMPHETAMINE SCREEN URINE: NEGATIVE ng/mL
BARBITURATE SCREEN URINE: NEGATIVE ng/mL
BENZODIAZEPINE SCREEN, URINE: NEGATIVE ng/mL
CANNABINOIDS UR QL SCN: POSITIVE ng/mL — AB
COCAINE(METAB.)SCREEN, URINE: NEGATIVE ng/mL
Creatinine(Crt), U: 240.1 mg/dL (ref 20.0–300.0)
Methadone Screen, Urine: NEGATIVE ng/mL
OPIATE SCREEN URINE: NEGATIVE ng/mL
OXYCODONE+OXYMORPHONE UR QL SCN: NEGATIVE ng/mL
PHENCYCLIDINE QUANTITATIVE URINE: NEGATIVE ng/mL
Ph of Urine: 6.4 (ref 4.5–8.9)
Propoxyphene Scrn, Ur: NEGATIVE ng/mL

## 2017-11-09 LAB — URINE CULTURE

## 2017-11-09 LAB — GC/CHLAMYDIA PROBE AMP
Chlamydia trachomatis, NAA: NEGATIVE
NEISSERIA GONORRHOEAE BY PCR: NEGATIVE

## 2017-11-14 ENCOUNTER — Encounter: Payer: Self-pay | Admitting: Women's Health

## 2017-11-14 DIAGNOSIS — O09899 Supervision of other high risk pregnancies, unspecified trimester: Secondary | ICD-10-CM | POA: Insufficient documentation

## 2017-11-14 DIAGNOSIS — Z283 Underimmunization status: Secondary | ICD-10-CM | POA: Insufficient documentation

## 2017-11-14 DIAGNOSIS — O9989 Other specified diseases and conditions complicating pregnancy, childbirth and the puerperium: Secondary | ICD-10-CM

## 2017-11-14 LAB — URINALYSIS, ROUTINE W REFLEX MICROSCOPIC
Bilirubin, UA: NEGATIVE
Glucose, UA: NEGATIVE
Ketones, UA: NEGATIVE
Leukocytes, UA: NEGATIVE
Nitrite, UA: NEGATIVE
PH UA: 6 (ref 5.0–7.5)
PROTEIN UA: NEGATIVE
RBC, UA: NEGATIVE
Specific Gravity, UA: 1.025 (ref 1.005–1.030)
Urobilinogen, Ur: 0.2 mg/dL (ref 0.2–1.0)

## 2017-11-14 LAB — OBSTETRIC PANEL, INCLUDING HIV
Antibody Screen: NEGATIVE
Basophils Absolute: 0 10*3/uL (ref 0.0–0.2)
Basos: 0 %
EOS (ABSOLUTE): 0.1 10*3/uL (ref 0.0–0.4)
EOS: 1 %
HEMOGLOBIN: 12.6 g/dL (ref 11.1–15.9)
HEP B S AG: NEGATIVE
HIV Screen 4th Generation wRfx: NONREACTIVE
Hematocrit: 37.9 % (ref 34.0–46.6)
IMMATURE GRANULOCYTES: 1 %
Immature Grans (Abs): 0.1 10*3/uL (ref 0.0–0.1)
LYMPHS ABS: 2.5 10*3/uL (ref 0.7–3.1)
Lymphs: 27 %
MCH: 26.8 pg (ref 26.6–33.0)
MCHC: 33.2 g/dL (ref 31.5–35.7)
MCV: 81 fL (ref 79–97)
Monocytes Absolute: 0.6 10*3/uL (ref 0.1–0.9)
Monocytes: 6 %
NEUTROS ABS: 6 10*3/uL (ref 1.4–7.0)
NEUTROS PCT: 65 %
Platelets: 278 10*3/uL (ref 150–450)
RBC: 4.71 x10E6/uL (ref 3.77–5.28)
RDW: 14.8 % (ref 12.3–15.4)
RH TYPE: POSITIVE
RPR: NONREACTIVE
WBC: 9.2 10*3/uL (ref 3.4–10.8)

## 2017-11-14 LAB — CYSTIC FIBROSIS MUTATION 97: Interpretation: NOT DETECTED

## 2017-11-14 LAB — SICKLE CELL SCREEN: SICKLE CELL SCREEN: NEGATIVE

## 2017-12-05 ENCOUNTER — Other Ambulatory Visit: Payer: Self-pay

## 2017-12-05 ENCOUNTER — Ambulatory Visit (INDEPENDENT_AMBULATORY_CARE_PROVIDER_SITE_OTHER): Payer: BLUE CROSS/BLUE SHIELD | Admitting: Obstetrics & Gynecology

## 2017-12-05 ENCOUNTER — Encounter: Payer: Self-pay | Admitting: Obstetrics & Gynecology

## 2017-12-05 ENCOUNTER — Ambulatory Visit (INDEPENDENT_AMBULATORY_CARE_PROVIDER_SITE_OTHER): Payer: BLUE CROSS/BLUE SHIELD

## 2017-12-05 VITALS — BP 117/70 | HR 95 | Wt 234.0 lb

## 2017-12-05 DIAGNOSIS — Z3682 Encounter for antenatal screening for nuchal translucency: Secondary | ICD-10-CM

## 2017-12-05 DIAGNOSIS — Z1389 Encounter for screening for other disorder: Secondary | ICD-10-CM

## 2017-12-05 DIAGNOSIS — Z3401 Encounter for supervision of normal first pregnancy, first trimester: Secondary | ICD-10-CM

## 2017-12-05 DIAGNOSIS — Z331 Pregnant state, incidental: Secondary | ICD-10-CM

## 2017-12-05 DIAGNOSIS — Z3A12 12 weeks gestation of pregnancy: Secondary | ICD-10-CM

## 2017-12-05 LAB — POCT URINALYSIS DIPSTICK
GLUCOSE UA: NEGATIVE
Ketones, UA: NEGATIVE
LEUKOCYTES UA: NEGATIVE
NITRITE UA: NEGATIVE
PROTEIN UA: POSITIVE — AB
RBC UA: NEGATIVE

## 2017-12-05 NOTE — Progress Notes (Signed)
G1P0 5440w5d Estimated Date of Delivery: 06/14/18  Blood pressure 117/70, pulse 95, weight 234 lb (106.1 kg), last menstrual period 09/07/2017.   BP weight and urine results all reviewed and noted.  Please refer to the obstetrical flow sheet for the fundal height and fetal heart rate documentation:  Patient reports good fetal movement, denies any bleeding and no rupture of membranes symptoms or regular contractions. Patient is without complaints. All questions were answered.  Orders Placed This Encounter  Procedures  . POCT urinalysis dipstick    Plan:  Continued routine obstetrical care, NT was unsuccessful, will retry next week  Return in about 1 week (around 12/12/2017) for  , NT sono no visit, 4 week ob visit.

## 2017-12-05 NOTE — Progress Notes (Signed)
US 12+5 wks,measurement c/w dates,fhr 164 bpm,anterior pl gr 0,normal ovaries bilat,NB present,unable to obtain NT because of fetal position

## 2017-12-11 ENCOUNTER — Other Ambulatory Visit: Payer: Self-pay | Admitting: Obstetrics and Gynecology

## 2017-12-11 DIAGNOSIS — Z3682 Encounter for antenatal screening for nuchal translucency: Secondary | ICD-10-CM

## 2017-12-11 DIAGNOSIS — Z0489 Encounter for examination and observation for other specified reasons: Secondary | ICD-10-CM

## 2017-12-11 DIAGNOSIS — IMO0002 Reserved for concepts with insufficient information to code with codable children: Secondary | ICD-10-CM

## 2017-12-12 ENCOUNTER — Ambulatory Visit (INDEPENDENT_AMBULATORY_CARE_PROVIDER_SITE_OTHER): Payer: BLUE CROSS/BLUE SHIELD

## 2017-12-12 ENCOUNTER — Other Ambulatory Visit: Payer: BLUE CROSS/BLUE SHIELD

## 2017-12-12 DIAGNOSIS — IMO0002 Reserved for concepts with insufficient information to code with codable children: Secondary | ICD-10-CM

## 2017-12-12 DIAGNOSIS — Z3A13 13 weeks gestation of pregnancy: Secondary | ICD-10-CM | POA: Diagnosis not present

## 2017-12-12 DIAGNOSIS — Z3682 Encounter for antenatal screening for nuchal translucency: Secondary | ICD-10-CM

## 2017-12-12 DIAGNOSIS — Z0489 Encounter for examination and observation for other specified reasons: Secondary | ICD-10-CM

## 2017-12-12 DIAGNOSIS — Z3402 Encounter for supervision of normal first pregnancy, second trimester: Secondary | ICD-10-CM

## 2017-12-12 NOTE — Progress Notes (Signed)
US 13+5 wks,measurements c/w dates,normal ovaries bilat,fhr 151 bpm,crl 73.84 mm,NB present,NT 1.7 mm

## 2017-12-14 LAB — INTEGRATED 1
CROWN RUMP LENGTH MAT SCREEN: 73.8 mm
GEST. AGE ON COLLECTION DATE: 13.3 wk
Maternal Age at EDD: 22.3 yr
Nuchal Translucency (NT): 1.7 mm
Number of Fetuses: 1
PAPP-A VALUE: 1613.9 ng/mL
WEIGHT: 233 [lb_av]

## 2017-12-19 ENCOUNTER — Encounter: Payer: Self-pay | Admitting: Adult Health

## 2018-01-02 ENCOUNTER — Encounter: Payer: Self-pay | Admitting: Obstetrics and Gynecology

## 2018-01-02 ENCOUNTER — Ambulatory Visit (INDEPENDENT_AMBULATORY_CARE_PROVIDER_SITE_OTHER): Payer: Medicaid Other | Admitting: Obstetrics and Gynecology

## 2018-01-02 VITALS — BP 106/69 | HR 87 | Wt 235.4 lb

## 2018-01-02 DIAGNOSIS — Z1389 Encounter for screening for other disorder: Secondary | ICD-10-CM

## 2018-01-02 DIAGNOSIS — Z3A16 16 weeks gestation of pregnancy: Secondary | ICD-10-CM

## 2018-01-02 DIAGNOSIS — Z1379 Encounter for other screening for genetic and chromosomal anomalies: Secondary | ICD-10-CM

## 2018-01-02 DIAGNOSIS — Z3402 Encounter for supervision of normal first pregnancy, second trimester: Secondary | ICD-10-CM

## 2018-01-02 DIAGNOSIS — Z331 Pregnant state, incidental: Secondary | ICD-10-CM

## 2018-01-02 LAB — POCT URINALYSIS DIPSTICK OB
Glucose, UA: NEGATIVE — AB
Ketones, UA: NEGATIVE
LEUKOCYTES UA: NEGATIVE
Nitrite, UA: NEGATIVE
RBC UA: NEGATIVE

## 2018-01-02 NOTE — Progress Notes (Signed)
G1P0 507w5d Estimated Date of Delivery: 06/14/18  Blood pressure 106/69, pulse 87, weight 235 lb 6.4 oz (106.8 kg), last menstrual period 09/07/2017.   BP weight and urine results all reviewed and noted. .fhr 141  FH obese, difficult to feel Please refer to the obstetrical flow sheet for the fundal height and fetal heart rate documentation:  Patient reports good fetal movement, denies any bleeding and no rupture of membranes symptoms or regular contractions. Patient is without complaints. All questions were answered.  Orders Placed This Encounter  Procedures  . INTEGRATED 2  . POC Urinalysis Dipstick OB    Plan:  Continued routine obstetrical care, 3 wk u/s   No follow-ups on file.

## 2018-01-04 LAB — INTEGRATED 2
ADSF: 1.1
AFP MoM: 1.61
Alpha-Fetoprotein: 38.9 ng/mL
CROWN RUMP LENGTH: 73.8 mm
DIA MoM: 1.22
DIA VALUE: 163.1 pg/mL
Estriol, Unconjugated: 0.83 ng/mL
Gest. Age on Collection Date: 13.3 weeks
Gestational Age: 16.3 weeks
Maternal Age at EDD: 22.3 yr
NUCHAL TRANSLUCENCY (NT): 1.7 mm
NUMBER OF FETUSES: 1
Nuchal Translucency MoM: 1
PAPP-A MOM: 2.31
PAPP-A VALUE: 1613.9 ng/mL
TEST RESULTS: NEGATIVE
WEIGHT: 233 [lb_av]
WEIGHT: 235 [lb_av]
hCG MoM: 1.03
hCG Value: 26.1 IU/mL

## 2018-01-19 ENCOUNTER — Other Ambulatory Visit: Payer: Self-pay | Admitting: Obstetrics and Gynecology

## 2018-01-19 DIAGNOSIS — Z363 Encounter for antenatal screening for malformations: Secondary | ICD-10-CM

## 2018-01-23 ENCOUNTER — Encounter: Payer: Self-pay | Admitting: Obstetrics & Gynecology

## 2018-01-23 ENCOUNTER — Ambulatory Visit (INDEPENDENT_AMBULATORY_CARE_PROVIDER_SITE_OTHER): Payer: Medicaid Other

## 2018-01-23 ENCOUNTER — Ambulatory Visit (INDEPENDENT_AMBULATORY_CARE_PROVIDER_SITE_OTHER): Payer: Medicaid Other | Admitting: Obstetrics & Gynecology

## 2018-01-23 VITALS — BP 110/73 | HR 100 | Wt 236.0 lb

## 2018-01-23 DIAGNOSIS — Z363 Encounter for antenatal screening for malformations: Secondary | ICD-10-CM | POA: Diagnosis not present

## 2018-01-23 DIAGNOSIS — Z331 Pregnant state, incidental: Secondary | ICD-10-CM

## 2018-01-23 DIAGNOSIS — Z3402 Encounter for supervision of normal first pregnancy, second trimester: Secondary | ICD-10-CM

## 2018-01-23 DIAGNOSIS — Z3A19 19 weeks gestation of pregnancy: Secondary | ICD-10-CM

## 2018-01-23 DIAGNOSIS — Z23 Encounter for immunization: Secondary | ICD-10-CM

## 2018-01-23 DIAGNOSIS — Z1389 Encounter for screening for other disorder: Secondary | ICD-10-CM

## 2018-01-23 LAB — POCT URINALYSIS DIPSTICK OB
Glucose, UA: NEGATIVE
NITRITE UA: NEGATIVE
PROTEIN: NEGATIVE

## 2018-01-23 NOTE — Progress Notes (Signed)
[place] 19+5 wks,cephalic,cx 3.7 cm,anterior pl gr 0,normal ovaries bilat,svp of fluid 5 cm,fhr 150 bpm,efw 333 g 67%,antatomy complete,no obvious abnormalities

## 2018-01-23 NOTE — Progress Notes (Signed)
   LOW-RISK PREGNANCY VISIT Patient name: Alison Sandoval MRN [id number]  Date of birth: 22-Jul-1995 Chief Complaint:   Routine Prenatal Visit ([place] today)  History of Present Illness:   Alison Sandoval is a 22 y.o. G1P0 female at [redacted]w[redacted]d with an Estimated Date of Delivery: 06/14/18 being seen today for ongoing management of a low-risk pregnancy.  Today she reports no complaints.  . Vag. Bleeding: None.  Movement: Present. denies leaking of fluid. Review of Systems:   Pertinent items are noted in HPI Denies abnormal vaginal discharge w/ itching/odor/irritation, headaches, visual changes, shortness of breath, chest pain, abdominal pain, severe nausea/vomiting, or problems with urination or bowel movements unless otherwise stated above. Pertinent History Reviewed:  Reviewed past medical,surgical, social, obstetrical and family history.  Reviewed problem list, medications and allergies. Physical Assessment:   Vitals:   01/23/18 1404  BP: 110/73  Pulse: 100  Weight: 236 lb (107 kg)  Body mass index is 40.51 kg/m.        Physical Examination:   General appearance: Well appearing, and in no distress  Mental status: Alert, oriented to person, place, and time  Skin: Warm & dry  Cardiovascular: Normal heart rate noted  Respiratory: Normal respiratory effort, no distress  Abdomen: Soft, gravid, nontender  Pelvic: Cervical exam deferred         Extremities: Edema: None  Fetal Status: Fetal Heart Rate (bpm): 155 Fundal Height: 20 cm Movement: Present    Results for orders placed or performed in visit on 01/23/18 (from the past 24 hour(s))  POC Urinalysis Dipstick OB   Collection Time: 01/23/18  2:05 PM  Result Value Ref Range   Color, UA     Clarity, UA     Glucose, UA Negative Negative   Bilirubin, UA     Ketones, UA 3+    Spec Grav, UA     Blood, UA 2+    pH, UA     POC Protein UA Negative Negative, Trace   Urobilinogen, UA     Nitrite, UA neg    Leukocytes, UA Trace (A) Negative   Appearance     Odor      Assessment & Plan:  1) Low-risk pregnancy G1P0 at [redacted]w[redacted]d with an Estimated Date of Delivery: 06/14/18      Meds: No orders of the defined types were placed in this encounter.  Labs/procedures today: sonogram anatomy scan is normal  Plan:  Continue routine obstetrical care   Reviewed: Term labor symptoms and general obstetric precautions including but not limited to vaginal bleeding, contractions, leaking of fluid and fetal movement were reviewed in detail with the patient.  All questions were answered  Follow-up: Return in about 4 weeks (around 02/20/2018) for LROB.  Orders Placed This Encounter  Procedures  . Flu Vaccine QUAD 36+ mos IM (Fluarix, Quad PF)  . POC Urinalysis Dipstick OB   Amaryllis Dyke Gurkirat Basher  01/23/2018 2:45 PM

## 2018-02-20 ENCOUNTER — Encounter: Payer: Medicaid Other | Admitting: Women's Health

## 2018-02-27 ENCOUNTER — Encounter: Payer: Medicaid Other | Admitting: Obstetrics & Gynecology

## 2018-03-01 ENCOUNTER — Ambulatory Visit (INDEPENDENT_AMBULATORY_CARE_PROVIDER_SITE_OTHER): Payer: Medicaid Other | Admitting: Obstetrics and Gynecology

## 2018-03-01 VITALS — BP 134/76 | HR 111 | Wt 240.4 lb

## 2018-03-01 DIAGNOSIS — K219 Gastro-esophageal reflux disease without esophagitis: Secondary | ICD-10-CM | POA: Insufficient documentation

## 2018-03-01 DIAGNOSIS — Z3A25 25 weeks gestation of pregnancy: Secondary | ICD-10-CM

## 2018-03-01 DIAGNOSIS — Z1389 Encounter for screening for other disorder: Secondary | ICD-10-CM

## 2018-03-01 DIAGNOSIS — O99619 Diseases of the digestive system complicating pregnancy, unspecified trimester: Secondary | ICD-10-CM

## 2018-03-01 DIAGNOSIS — Z331 Pregnant state, incidental: Secondary | ICD-10-CM

## 2018-03-01 LAB — POCT URINALYSIS DIPSTICK OB
Blood, UA: NEGATIVE
GLUCOSE, UA: NEGATIVE
Ketones, UA: NEGATIVE
Leukocytes, UA: NEGATIVE
Nitrite, UA: NEGATIVE

## 2018-03-01 MED ORDER — OMEPRAZOLE 20 MG PO CPDR: 20.0000 mg | DELAYED_RELEASE_CAPSULE | Freq: Every day | ORAL | 3 refills | Status: DC

## 2018-03-01 NOTE — Progress Notes (Signed)
Patient ID: Taija Mathias, female   DOB: Oct 13, 1995, 22 y.o.   MRN: [id number]   LOW-RISK PREGNANCY VISIT Patient name: Alison Sandoval MRN [id number]  Date of birth: [birth year] Chief Complaint:   Routine Prenatal Visit  History of Present Illness:   Alison Sandoval is a 22 y.o. G1P0 female at [redacted]w[redacted]d with an Estimated Date of Delivery: 06/14/18 being seen today for ongoing management of a low-risk pregnancy.  Today she reports acid reflux. Tums don't help    . Vag. Bleeding: None.  Movement: Present. denies leaking of fluid. Review of Systems:   Pertinent items are noted in HPI Denies abnormal vaginal discharge w/ itching/odor/irritation, headaches, visual changes, shortness of breath, chest pain, abdominal pain, severe nausea/vomiting, or problems with urination or bowel movements unless otherwise stated above. Pertinent History Reviewed:  Reviewed past medical,surgical, social, obstetrical and family history.  Reviewed problem list, medications and allergies. Physical Assessment:   Vitals:   03/01/18 1552  BP: 134/76  Pulse: (!) 111  Weight: 240 lb 6.4 oz (109 kg)  Body mass index is 41.26 kg/m.        Physical Examination:   General appearance: Well appearing, and in no distress  Mental status: Alert, oriented to person, place, and time  Skin: Warm & dry  Cardiovascular: Normal heart rate noted  Respiratory: Normal respiratory effort, no distress  Abdomen: Soft, gravid, nontender  Pelvic: Cervical exam deferred         Extremities: Edema: None  Fetal Status: Fetal Heart Rate (bpm): 142 Fundal Height: 25 cm Movement: Present    Results for orders placed or performed in visit on 03/01/18 (from the past 24 hour(s))  POC Urinalysis Dipstick OB   Collection Time: 03/01/18  3:55 PM  Result Value Ref Range   Color, UA     Clarity, UA     Glucose, UA Negative Negative   Bilirubin, UA     Ketones, UA neg    Spec Grav, UA     Blood, UA neg    pH, UA     POC Protein UA Trace  Negative, Trace   Urobilinogen, UA     Nitrite, UA neg    Leukocytes, UA Negative Negative   Appearance     Odor      Assessment & Plan:  1) Low-risk pregnancy G1P0 at [redacted]w[redacted]d with an Estimated Date of Delivery: 06/14/18   2) GERD in pregnancy   Plan:   1) Continue routine obstetrical care 2) Omeprazole   Meds:  Meds ordered this encounter  Medications  . omeprazole (PRILOSEC) 20 MG capsule    Sig: Take 1 capsule (20 mg total) by mouth daily.    Dispense:  30 capsule    Refill:  3   Labs/procedures today: none  Follow-up: Return in about 4 weeks (around 03/29/2018) for LROB.  Orders Placed This Encounter  Procedures  . POC Urinalysis Dipstick OB   By signing my name below, I, Alison Sandoval, attest that this documentation has been prepared under the direction and in the presence of Tilda Burrow, MD Electronically Signed: Pietro Sandoval, Medical Scribe. 03/01/18. 4:29 PM.  I personally performed the services described in this documentation, which was SCRIBED in my presence. The recorded information has been reviewed and considered accurate. It has been edited as necessary during review. Tilda Burrow, MD

## 2018-03-29 ENCOUNTER — Other Ambulatory Visit: Payer: Medicaid Other

## 2018-03-29 ENCOUNTER — Ambulatory Visit (INDEPENDENT_AMBULATORY_CARE_PROVIDER_SITE_OTHER): Payer: Medicaid Other | Admitting: Advanced Practice Midwife

## 2018-03-29 ENCOUNTER — Encounter: Payer: Medicaid Other | Admitting: Advanced Practice Midwife

## 2018-03-29 ENCOUNTER — Encounter: Payer: Self-pay | Admitting: Advanced Practice Midwife

## 2018-03-29 ENCOUNTER — Other Ambulatory Visit: Payer: Self-pay

## 2018-03-29 VITALS — BP 107/68 | HR 94 | Wt 240.0 lb

## 2018-03-29 DIAGNOSIS — Z131 Encounter for screening for diabetes mellitus: Secondary | ICD-10-CM | POA: Diagnosis not present

## 2018-03-29 DIAGNOSIS — Z3403 Encounter for supervision of normal first pregnancy, third trimester: Secondary | ICD-10-CM

## 2018-03-29 DIAGNOSIS — Z3A29 29 weeks gestation of pregnancy: Secondary | ICD-10-CM

## 2018-03-29 DIAGNOSIS — Z23 Encounter for immunization: Secondary | ICD-10-CM | POA: Diagnosis not present

## 2018-03-29 DIAGNOSIS — Z331 Pregnant state, incidental: Secondary | ICD-10-CM

## 2018-03-29 DIAGNOSIS — Z1389 Encounter for screening for other disorder: Secondary | ICD-10-CM

## 2018-03-29 LAB — POCT URINALYSIS DIPSTICK OB
Blood, UA: NEGATIVE
GLUCOSE, UA: NEGATIVE
Ketones, UA: NEGATIVE
LEUKOCYTES UA: NEGATIVE
Nitrite, UA: NEGATIVE

## 2018-03-29 NOTE — Patient Instructions (Signed)
Claude Manges, I greatly value your feedback.  If you receive a survey following your visit with [place] today, we appreciate you taking the time to fill it out.  Thanks, Cathie Beams, CNM   Call the office [phone]) or go to [facility] if:  You begin to have strong, frequent contractions  Your water breaks.  Sometimes it is a big gush of fluid, sometimes it is just a trickle that keeps getting your panties wet or running down your legs  You have vaginal bleeding.  It is normal to have a small amount of spotting if your cervix was checked.   You don't feel your baby moving like normal.  If you don't, get you something to eat and drink and lay down and focus on feeling your baby move.  You should feel at least 10 movements in 2 hours.  If you don't, you should call the office or go to [facility].    Tdap Vaccine  It is recommended that you get the Tdap vaccine during the third trimester of EACH pregnancy to help protect your baby from getting pertussis (whooping cough)  27-36 weeks is the BEST time to do this so that you can pass the protection on to your baby. During pregnancy is better than after pregnancy, but if you are unable to get it during pregnancy it will be offered at the hospital.   You will be offered this vaccine in the office after 27 weeks. If you do not have health insurance, you can get this vaccine at the health department or your family doctor  Everyone who will be around your baby should also be up-to-date on their vaccines. Adults (who are not pregnant) only need 1 dose of Tdap during adulthood.   Third Trimester of Pregnancy The third trimester is from week 29 through week 42, months 7 through 9. The third trimester is a time when the fetus is growing rapidly. At the end of the ninth month, the fetus is about 20 inches in length and weighs 6-10 pounds.  BODY CHANGES Your body goes through many changes during pregnancy. The changes vary from woman to  woman.   Your weight will continue to increase. You can expect to gain 25-35 pounds (11-16 kg) by the end of the pregnancy.  You may begin to get stretch marks on your hips, abdomen, and breasts.  You may urinate more often because the fetus is moving lower into your pelvis and pressing on your bladder.  You may develop or continue to have heartburn as a result of your pregnancy.  You may develop constipation because certain hormones are causing the muscles that push waste through your intestines to slow down.  You may develop hemorrhoids or swollen, bulging veins (varicose veins).  You may have pelvic pain because of the weight gain and pregnancy hormones relaxing your joints between the bones in your pelvis. Backaches may result from overexertion of the muscles supporting your posture.  You may have changes in your hair. These can include thickening of your hair, rapid growth, and changes in texture. Some women also have hair loss during or after pregnancy, or hair that feels dry or thin. Your hair will most likely return to normal after your baby is born.  Your breasts will continue to grow and be tender. A yellow discharge may leak from your breasts called colostrum.  Your belly button may stick out.  You may feel short of breath because of your expanding uterus.  You  may notice the fetus "dropping," or moving lower in your abdomen.  You may have a bloody mucus discharge. This usually occurs a few days to a week before labor begins.  Your cervix becomes thin and soft (effaced) near your due date. WHAT TO EXPECT AT YOUR PRENATAL EXAMS  You will have prenatal exams every 2 weeks until week 36. Then, you will have weekly prenatal exams. During a routine prenatal visit:  You will be weighed to make sure you and the fetus are growing normally.  Your blood pressure is taken.  Your abdomen will be measured to track your baby's growth.  The fetal heartbeat will be listened  to.  Any test results from the previous visit will be discussed.  You may have a cervical check near your due date to see if you have effaced. At around 36 weeks, your caregiver will check your cervix. At the same time, your caregiver will also perform a test on the secretions of the vaginal tissue. This test is to determine if a type of bacteria, Group B streptococcus, is present. Your caregiver will explain this further. Your caregiver may ask you:  What your birth plan is.  How you are feeling.  If you are feeling the baby move.  If you have had any abnormal symptoms, such as leaking fluid, bleeding, severe headaches, or abdominal cramping.  If you have any questions. Other tests or screenings that may be performed during your third trimester include:  Blood tests that check for low iron levels (anemia).  Fetal testing to check the health, activity level, and growth of the fetus. Testing is done if you have certain medical conditions or if there are problems during the pregnancy. FALSE LABOR You may feel small, irregular contractions that eventually go away. These are called Braxton Hicks contractions, or false labor. Contractions may last for hours, days, or even weeks before true labor sets in. If contractions come at regular intervals, intensify, or become painful, it is best to be seen by your caregiver.  SIGNS OF LABOR   Menstrual-like cramps.  Contractions that are 5 minutes apart or less.  Contractions that start on the top of the uterus and spread down to the lower abdomen and back.  A sense of increased pelvic pressure or back pain.  A watery or bloody mucus discharge that comes from the vagina. If you have any of these signs before the 37th week of pregnancy, call your caregiver right away. You need to go to the hospital to get checked immediately. HOME CARE INSTRUCTIONS   Avoid all smoking, herbs, alcohol, and unprescribed drugs. These chemicals affect the  formation and growth of the baby.  Follow your caregiver's instructions regarding medicine use. There are medicines that are either safe or unsafe to take during pregnancy.  Exercise only as directed by your caregiver. Experiencing uterine cramps is a good sign to stop exercising.  Continue to eat regular, healthy meals.  Wear a good support bra for breast tenderness.  Do not use hot tubs, steam rooms, or saunas.  Wear your seat belt at all times when driving.  Avoid raw meat, uncooked cheese, cat litter boxes, and soil used by cats. These carry germs that can cause birth defects in the baby.  Take your prenatal vitamins.  Try taking a stool softener (if your caregiver approves) if you develop constipation. Eat more high-fiber foods, such as fresh vegetables or fruit and whole grains. Drink plenty of fluids to keep your urine clear  or pale yellow.  Take warm sitz baths to soothe any pain or discomfort caused by hemorrhoids. Use hemorrhoid cream if your caregiver approves.  If you develop varicose veins, wear support hose. Elevate your feet for 15 minutes, 3-4 times a day. Limit salt in your diet.  Avoid heavy lifting, wear low heal shoes, and practice good posture.  Rest a lot with your legs elevated if you have leg cramps or low back pain.  Visit your dentist if you have not gone during your pregnancy. Use a soft toothbrush to brush your teeth and be gentle when you floss.  A sexual relationship may be continued unless your caregiver directs you otherwise.  Do not travel far distances unless it is absolutely necessary and only with the approval of your caregiver.  Take prenatal classes to understand, practice, and ask questions about the labor and delivery.  Make a trial run to the hospital.  Pack your hospital bag.  Prepare the baby's nursery.  Continue to go to all your prenatal visits as directed by your caregiver. SEEK MEDICAL CARE IF:  You are unsure if you are in  labor or if your water has broken.  You have dizziness.  You have mild pelvic cramps, pelvic pressure, or nagging pain in your abdominal area.  You have persistent nausea, vomiting, or diarrhea.  You have a bad smelling vaginal discharge.  You have pain with urination. SEEK IMMEDIATE MEDICAL CARE IF:   You have a fever.  You are leaking fluid from your vagina.  You have spotting or bleeding from your vagina.  You have severe abdominal cramping or pain.  You have rapid weight loss or gain.  You have shortness of breath with chest pain.  You notice sudden or extreme swelling of your face, hands, ankles, feet, or legs.  You have not felt your baby move in over an hour.  You have severe headaches that do not go away with medicine.  You have vision changes. Document Released: 05/03/2001 Document Revised: 05/14/2013 Document Reviewed: 07/10/2012 [facility] Patient Information 2015 [place], [place]. This information is not intended to replace advice given to you by your health care provider. Make sure you discuss any questions you have with your health care provider.

## 2018-03-29 NOTE — Progress Notes (Signed)
  G1P0 [redacted]w[redacted]d Estimated Date of Delivery: 06/14/18  Blood pressure 107/68, pulse 94, weight 240 lb (108.9 kg), last menstrual period 09/07/2017.   BP weight and urine results all reviewed and noted.  Please refer to the obstetrical flow sheet for the fundal height and fetal heart rate documentation:  Patient reports good fetal movement, denies any bleeding and no rupture of membranes symptoms or regular contractions. Patient is without complaints. All questions were answered.   Physical Assessment:   Vitals:   03/29/18 0952  BP: 107/68  Pulse: 94  Weight: 240 lb (108.9 kg)  Body mass index is 41.2 kg/m.        Physical Examination:   General appearance: Well appearing, and in no distress  Mental status: Alert, oriented to person, place, and time  Skin: Warm & dry  Cardiovascular: Normal heart rate noted  Respiratory: Normal respiratory effort, no distress  Abdomen: Soft, gravid, nontender  Pelvic: Cervical exam deferred         Extremities: Edema: None  Fetal Status:     Movement: Present    Results for orders placed or performed in visit on 03/29/18 (from the past 24 hour(s))  POC Urinalysis Dipstick OB   Collection Time: 03/29/18  9:46 AM  Result Value Ref Range   Color, UA     Clarity, UA     Glucose, UA Negative Negative   Bilirubin, UA     Ketones, UA neg    Spec Grav, UA     Blood, UA neg    pH, UA     POC,PROTEIN,UA Trace Negative, Trace   Urobilinogen, UA     Nitrite, UA neg    Leukocytes, UA Negative Negative   Appearance     Odor       Orders Placed This Encounter  Procedures  . Tdap vaccine greater than or equal to 7yo IM  . POC Urinalysis Dipstick OB    Plan:  Continued routine obstetrical care, PN2 today   Return in about 3 weeks (around 04/19/2018) for LROB.

## 2018-03-30 ENCOUNTER — Other Ambulatory Visit: Payer: Self-pay | Admitting: *Deleted

## 2018-03-30 ENCOUNTER — Encounter: Payer: Self-pay | Admitting: Advanced Practice Midwife

## 2018-03-30 DIAGNOSIS — O24419 Gestational diabetes mellitus in pregnancy, unspecified control: Secondary | ICD-10-CM | POA: Insufficient documentation

## 2018-03-30 LAB — HIV ANTIBODY (ROUTINE TESTING W REFLEX): HIV SCREEN 4TH GENERATION: NONREACTIVE

## 2018-03-30 LAB — CBC
Hematocrit: 34.3 % (ref 34.0–46.6)
Hemoglobin: 11.3 g/dL (ref 11.1–15.9)
MCH: 26.8 pg (ref 26.6–33.0)
MCHC: 32.9 g/dL (ref 31.5–35.7)
MCV: 81 fL (ref 79–97)
PLATELETS: 267 10*3/uL (ref 150–450)
RBC: 4.22 x10E6/uL (ref 3.77–5.28)
RDW: 13.6 % (ref 12.3–15.4)
WBC: 9.6 10*3/uL (ref 3.4–10.8)

## 2018-03-30 LAB — GLUCOSE TOLERANCE, 2 HOURS W/ 1HR
GLUCOSE, 1 HOUR: 149 mg/dL (ref 65–179)
GLUCOSE, 2 HOUR: 114 mg/dL (ref 65–152)
Glucose, Fasting: 100 mg/dL — ABNORMAL HIGH (ref 65–91)

## 2018-03-30 LAB — ANTIBODY SCREEN: ANTIBODY SCREEN: NEGATIVE

## 2018-03-30 LAB — RPR: RPR Ser Ql: NONREACTIVE

## 2018-03-30 MED ORDER — ACCU-CHEK GUIDE ME W/DEVICE KIT: 1.0000 | PACK | Freq: Once | 0 refills | Status: AC

## 2018-03-30 MED ORDER — ACCU-CHEK SAFE-T PRO LANCETS MISC: 12 refills | Status: DC

## 2018-03-30 MED ORDER — GLUCOSE BLOOD VI STRP: ORAL_STRIP | 12 refills | Status: DC

## 2018-04-04 ENCOUNTER — Encounter: Payer: Medicaid Other | Attending: Obstetrics and Gynecology | Admitting: Registered"

## 2018-04-04 ENCOUNTER — Telehealth: Payer: Self-pay | Admitting: *Deleted

## 2018-04-04 DIAGNOSIS — O99810 Abnormal glucose complicating pregnancy: Secondary | ICD-10-CM

## 2018-04-04 DIAGNOSIS — O24419 Gestational diabetes mellitus in pregnancy, unspecified control: Secondary | ICD-10-CM | POA: Diagnosis not present

## 2018-04-04 DIAGNOSIS — Z713 Dietary counseling and surveillance: Secondary | ICD-10-CM | POA: Insufficient documentation

## 2018-04-04 NOTE — Telephone Encounter (Signed)
Left message @ 3:27 pm. JSY 

## 2018-04-05 NOTE — Telephone Encounter (Signed)
Spoke with pt. Pt has had pain in hips and tailbone. Has gotten worse. Been going on x 2 weeks. Has trouble sleeping due to pain. Tylenol helps some. Sitting on yoga ball helps. Advised can use pillow between legs and can try a belly band. + baby movement. No spotting. Pt voiced understanding. JSY

## 2018-04-06 ENCOUNTER — Encounter: Payer: Self-pay | Admitting: Registered"

## 2018-04-06 NOTE — Progress Notes (Signed)
Patient was seen on 04/04/18 for Gestational Diabetes self-management class at the [facility]. The following learning objectives were met by the patient during this course:   States the definition of Gestational Diabetes  States why dietary management is important in controlling blood glucose  Describes the effects each nutrient has on blood glucose levels  Demonstrates ability to create a balanced meal plan  Demonstrates carbohydrate counting   States when to check blood glucose levels  Demonstrates proper blood glucose monitoring techniques  States the effect of stress and exercise on blood glucose levels  States the importance of limiting caffeine and abstaining from alcohol and smoking  Blood glucose monitor given: Accu-chek Guide Me Lot # [id number] Exp: 04/25/19 Blood glucose reading: 95 mg/dL  Patient instructed to monitor glucose levels: FBS: 60 - <95; 1 hour: <140; 2 hour: <120  Patient received handouts:  Nutrition Diabetes and Pregnancy, including carb counting list  Patient will be seen for follow-up as needed.

## 2018-04-16 ENCOUNTER — Ambulatory Visit (INDEPENDENT_AMBULATORY_CARE_PROVIDER_SITE_OTHER): Payer: Medicaid Other | Admitting: Women's Health

## 2018-04-16 ENCOUNTER — Encounter: Payer: Self-pay | Admitting: Women's Health

## 2018-04-16 VITALS — BP 130/69 | HR 121 | Wt 245.0 lb

## 2018-04-16 DIAGNOSIS — Z331 Pregnant state, incidental: Secondary | ICD-10-CM

## 2018-04-16 DIAGNOSIS — Z1389 Encounter for screening for other disorder: Secondary | ICD-10-CM

## 2018-04-16 DIAGNOSIS — O24419 Gestational diabetes mellitus in pregnancy, unspecified control: Secondary | ICD-10-CM

## 2018-04-16 DIAGNOSIS — Z3A31 31 weeks gestation of pregnancy: Secondary | ICD-10-CM

## 2018-04-16 DIAGNOSIS — O0993 Supervision of high risk pregnancy, unspecified, third trimester: Secondary | ICD-10-CM

## 2018-04-16 LAB — POCT URINALYSIS DIPSTICK OB
Blood, UA: NEGATIVE
GLUCOSE, UA: NEGATIVE
KETONES UA: NEGATIVE
LEUKOCYTES UA: NEGATIVE
NITRITE UA: NEGATIVE

## 2018-04-16 NOTE — Progress Notes (Signed)
HIGH-RISK PREGNANCY VISIT Patient name: Alison Sandoval MRN [id number]  Date of birth: [birth year] Chief Complaint:   High Risk Gestation (aches and pains in legs and hips and top of tailbone)  History of Present Illness:   Alison Sandoval is a 22 y.o. G1P0 female at [redacted]w[redacted]d with an Estimated Date of Delivery: 06/14/18 being seen today for ongoing management of a high-risk pregnancy complicated by A1DM. Anxious about life w/ new baby, does not feel it consumes her thoughts or alters her daily living. Denies SI/HI. Good appetite, not sleeping well d/t discomforts of pregnancy, still finds joy in things she used to. Went to GDM class. Not checking sugars, [facility] said glucometers were out of stock. Still drinking [address], eating same way.   Depression screen [facility] 2/9 04/16/2018 04/16/2018 04/06/2018 11/07/2017  Decreased Interest 1 1 0 1  Down, Depressed, Hopeless 1 1 0 1  PHQ - 2 Score 2 2 0 2  Altered sleeping 1 - - 2  Tired, decreased energy 1 - - 1  Change in appetite 1 - - 2  Feeling bad or failure about yourself  0 - - 1  Trouble concentrating 1 - - 0  Moving slowly or fidgety/restless 1 - - 0  Suicidal thoughts 0 - - 0  PHQ-9 Score 7 - - 8  Difficult doing work/chores Somewhat difficult - - Somewhat difficult    Today she reports no other complaints. Contractions: Irregular. Vag. Bleeding: None.  Movement: Present. denies leaking of fluid.  Review of Systems:   Pertinent items are noted in HPI Denies abnormal vaginal discharge w/ itching/odor/irritation, headaches, visual changes, shortness of breath, chest pain, abdominal pain, severe nausea/vomiting, or problems with urination or bowel movements unless otherwise stated above. Pertinent History Reviewed:  Reviewed past medical,surgical, social, obstetrical and family history.  Reviewed problem list, medications and allergies. Physical Assessment:   Vitals:   04/16/18 1612  BP: 130/69  Pulse: (!) 121  Weight: 245 lb (111.1 kg)  Body  mass index is 42.05 kg/m.           Physical Examination:   General appearance: alert, well appearing, and in no distress  Mental status: alert, oriented to person, place, and time  Skin: warm & dry   Extremities: Edema: Trace    Cardiovascular: normal heart rate noted  Respiratory: normal respiratory effort, no distress  Abdomen: gravid, soft, non-tender  Pelvic: Cervical exam deferred         Fetal Status: Fetal Heart Rate (bpm): 150 Fundal Height: 32 cm Movement: Present    Fetal Surveillance Testing today: doppler   Results for orders placed or performed in visit on 04/16/18 (from the past 24 hour(s))  POC Urinalysis Dipstick OB   Collection Time: 04/16/18  4:12 PM  Result Value Ref Range   Color, UA     Clarity, UA     Glucose, UA Negative Negative   Bilirubin, UA     Ketones, UA neg    Spec Grav, UA     Blood, UA neg    pH, UA     POC,PROTEIN,UA Trace Negative, Trace, Small (1+), Moderate (2+), Large (3+), 4+   Urobilinogen, UA     Nitrite, UA neg    Leukocytes, UA Negative Negative   Appearance     Odor      Assessment & Plan:  1) High-risk pregnancy G1P0 at [redacted]w[redacted]d with an Estimated Date of Delivery: 06/14/18   2) A1DM, not checking sugars yet- [facility]- if not let us know and we will send to different pharmacy. Thoroughly discussed diet changes needed, no more [place], needs to decrease carbs/simple sugars and increase protein and activity level. Discussed importance of strict glycemic control and adherence to low carb diet during pregnancy as well as potential complications from uncontrolled diabetes during pregnancy.  3) Anxiety, about life w/ new baby. Overall feels she is doing well. Sounds like normal first mom concerns. Denies depression. To let us know if anything changes  Meds: No orders of the defined types were placed in this encounter.  Labs/procedures today: none  Treatment Plan:  Growth u/s @  36-38wks      Deliver @ 39-40wks  Reviewed: Preterm labor symptoms and general obstetric precautions including but not limited to vaginal bleeding, contractions, leaking of fluid and fetal movement were reviewed in detail with the patient.  All questions were answered.  Follow-up: Return in about 1 week (around 04/23/2018) for HROB. to see how sugars are doing  Orders Placed This Encounter  Procedures  . POC Urinalysis Dipstick OB   Cheral MarkerKimberly R Kaio Kuhlman CNM, WHNP-BC 04/17/2018 9:00 AM

## 2018-04-16 NOTE — Patient Instructions (Addendum)
Alison Sandoval, I greatly value your feedback.  If you receive a survey following your visit with [place] today, we appreciate you taking the time to fill it out.  Thanks, Alison Sandoval, CNM, WHNP-BC  Check blood sugars 4 times a day: in the morning before eating/drinking anything (<95) and 2 hours after eating breakfast, lunch, and supper (<120).     Call the office [phone]) or go to [facility] if:  You begin to have strong, frequent contractions  Your water breaks.  Sometimes it is a big gush of fluid, sometimes it is just a trickle that keeps getting your panties wet or running down your legs  You have vaginal bleeding.  It is normal to have a small amount of spotting if your cervix was checked.   You don't feel your baby moving like normal.  If you don't, get you something to eat and drink and lay down and focus on feeling your baby move.  You should feel at least 10 movements in 2 hours.  If you don't, you should call the office or go to [facility].     Preterm Labor and Birth Information The normal length of a pregnancy is 39-41 weeks. Preterm labor is when labor starts before 37 completed weeks of pregnancy. What are the risk factors for preterm labor? Preterm labor is more likely to occur in women who:  Have certain infections during pregnancy such as a bladder infection, sexually transmitted infection, or infection inside the uterus (chorioamnionitis).  Have a shorter-than-normal cervix.  Have gone into preterm labor before.  Have had surgery on their cervix.  Are younger than age 55 or older than age 66.  Are African American.  Are pregnant with twins or multiple babies (multiple gestation).  Take street drugs or smoke while pregnant.  Do not gain enough weight while pregnant.  Became pregnant shortly after having been pregnant.  What are the symptoms of preterm labor? Symptoms of preterm labor include:  Cramps similar to those that can happen during a  menstrual period. The cramps may happen with diarrhea.  Pain in the abdomen or lower back.  Regular uterine contractions that may feel like tightening of the abdomen.  A feeling of increased pressure in the pelvis.  Increased watery or bloody mucus discharge from the vagina.  Water breaking (ruptured amniotic sac).  Why is it important to recognize signs of preterm labor? It is important to recognize signs of preterm labor because babies who are born prematurely may not be fully developed. This can put them at an increased risk for:  Long-term (chronic) heart and lung problems.  Difficulty immediately after birth with regulating body systems, including blood sugar, body temperature, heart rate, and breathing rate.  Bleeding in the brain.  Cerebral palsy.  Learning difficulties.  Death.  These risks are highest for babies who are born before 34 weeks of pregnancy. How is preterm labor treated? Treatment depends on the length of your pregnancy, your condition, and the health of your baby. It may involve:  Having a stitch (suture) placed in your cervix to prevent your cervix from opening too early (cerclage).  Taking or being given medicines, such as: ? Hormone medicines. These may be given early in pregnancy to help support the pregnancy. ? Medicine to stop contractions. ? Medicines to help mature the baby's lungs. These may be prescribed if the risk of delivery is high. ? Medicines to prevent your baby from developing cerebral palsy.  If the labor happens  before 34 weeks of pregnancy, you may need to stay in the hospital. What should I do if I think I am in preterm labor? If you think that you are going into preterm labor, call your health care provider right away. How can I prevent preterm labor in future pregnancies? To increase your chance of having a full-term pregnancy:  Do not use any tobacco products, such as cigarettes, chewing tobacco, and e-cigarettes. If you  need help quitting, ask your health care provider.  Do not use street drugs or medicines that have not been prescribed to you during your pregnancy.  Talk with your health care provider before taking any herbal supplements, even if you have been taking them regularly.  Make sure you gain a healthy amount of weight during your pregnancy.  Watch for infection. If you think that you might have an infection, get it checked right away.  Make sure to tell your health care provider if you have gone into preterm labor before.  This information is not intended to replace advice given to you by your health care provider. Make sure you discuss any questions you have with your health care provider. Document Released: 07/30/2003 Document Revised: 10/20/2015 Document Reviewed: 09/30/2015 Elsevier Interactive Patient Education  2018 [employer]   Gestational Diabetes Mellitus, Diagnosis Gestational diabetes (gestational diabetes mellitus) is a short-term (temporary) form of diabetes that can happen during pregnancy. It goes away after you give birth. It may be caused by one or both of these problems:  Your body does not make enough of a hormone called insulin.  Your body does not respond in a normal way to insulin that it makes.  Insulin lets sugars (glucose) go into cells in the body. This gives you energy. If you have diabetes, sugars cannot get into cells. This causes high blood sugar (hyperglycemia). If diabetes is treated, it may not hurt you or your baby. Your doctor will set treatment goals for you. In general, you should have these blood sugar levels:  After not eating for a long time (fasting): 95 mg/dL (5.3 mmol/L).  After meals (postprandial): ? One hour after a meal: at or below 140 mg/dL (7.8 mmol/L). ? Two hours after a meal: at or below 120 mg/dL (6.7 mmol/L).  A1c (hemoglobin A1c) level: 6-6.5%.  Follow these instructions at home: Questions to Ask Your Doctor  You may want  to ask these questions:  Do I need to meet with a diabetes educator?  Where can I find a support group for people with diabetes?  What equipment will I need to care for myself at home?  What diabetes medicines do I need? When should I take them?  How often do I need to check my blood sugar?  What number can I call if I have questions?  When is my next doctor's visit?  General instructions  Take over-the-counter and prescription medicines only as told by your doctor.  Stay at a healthy weight during pregnancy.  Keep all follow-up visits as told by your doctor. This is important. Contact a doctor if:  Your blood sugar is at or above 240 mg/dL (16.113.3 mmol/L).  Your blood sugar is at or above 200 mg/dL (09.611.1 mmol/L) and you have ketones in your pee (urine).  You have been sick or have had a fever for 2 days or more and you are not getting better.  You have any of these problems for more than 6 hours: ? You cannot eat or drink. ?  You feel sick to your stomach (nauseous). ? You throw up (vomit). ? You have watery poop (diarrhea). Get help right away if:  Your blood sugar is lower than 54 mg/dL (3 mmol/L).  You get confused.  You have trouble: ? Thinking clearly. ? Breathing.  Your baby moves less than normal.  You have: ? Moderate or large ketone levels in your pee (urine). ? Bleeding from your vagina. ? Unusual fluid coming from your vagina. ? Early contractions. These may feel like tightness in your belly. This information is not intended to replace advice given to you by your health care provider. Make sure you discuss any questions you have with your health care provider. Document Released: 08/31/2015 Document Revised: 10/15/2015 Document Reviewed: 06/12/2015 Elsevier Interactive Patient Education  [employer].

## 2018-04-18 ENCOUNTER — Encounter: Payer: Medicaid Other | Admitting: Women's Health

## 2018-04-24 ENCOUNTER — Encounter: Payer: Medicaid Other | Admitting: Advanced Practice Midwife

## 2018-04-25 ENCOUNTER — Other Ambulatory Visit: Payer: Self-pay | Admitting: *Deleted

## 2018-04-25 MED ORDER — ACCU-CHEK FASTCLIX LANCETS MISC: 1.0000 | Freq: Four times a day (QID) | 12 refills | Status: DC

## 2018-04-26 DIAGNOSIS — R21 Rash and other nonspecific skin eruption: Secondary | ICD-10-CM | POA: Diagnosis not present

## 2018-04-26 DIAGNOSIS — L309 Dermatitis, unspecified: Secondary | ICD-10-CM | POA: Diagnosis not present

## 2018-05-02 ENCOUNTER — Ambulatory Visit (INDEPENDENT_AMBULATORY_CARE_PROVIDER_SITE_OTHER): Payer: Medicaid Other | Admitting: Advanced Practice Midwife

## 2018-05-02 ENCOUNTER — Encounter: Payer: Self-pay | Admitting: Advanced Practice Midwife

## 2018-05-02 VITALS — BP 128/89 | HR 105 | Wt 244.4 lb

## 2018-05-02 DIAGNOSIS — Z3A33 33 weeks gestation of pregnancy: Secondary | ICD-10-CM

## 2018-05-02 DIAGNOSIS — O24410 Gestational diabetes mellitus in pregnancy, diet controlled: Secondary | ICD-10-CM

## 2018-05-02 DIAGNOSIS — Z331 Pregnant state, incidental: Secondary | ICD-10-CM

## 2018-05-02 DIAGNOSIS — O0993 Supervision of high risk pregnancy, unspecified, third trimester: Secondary | ICD-10-CM

## 2018-05-02 DIAGNOSIS — Z1389 Encounter for screening for other disorder: Secondary | ICD-10-CM

## 2018-05-02 LAB — POCT URINALYSIS DIPSTICK OB
Blood, UA: NEGATIVE
GLUCOSE, UA: NEGATIVE
KETONES UA: NEGATIVE
Leukocytes, UA: NEGATIVE
Nitrite, UA: NEGATIVE
POC,PROTEIN,UA: NEGATIVE

## 2018-05-02 NOTE — Progress Notes (Signed)
HIGH-RISK PREGNANCY VISIT Patient name: Alison Sandoval MRN [id number]  Date of birth: [birth year] Chief Complaint:   High Risk Gestation  History of Present Illness:   Fernanda Twaddell is a 22 y.o. G1P0 female at [redacted]w[redacted]d with an Estimated Date of Delivery: 06/14/18 being seen today for ongoing management of a high-risk pregnancy complicated by gestational DM, class  A1 DM.  Today she reports FBS all but 2<95 and 2 hr pp all but 3 <120 May go to testing 1 hr pp. . Contractions: Irregular.  .  Movement: Present. denies leaking of fluid.  Review of Systems:   Pertinent items are noted in HPI Denies abnormal vaginal discharge w/ itching/odor/irritation, headaches, visual changes, shortness of breath, chest pain, abdominal pain, severe nausea/vomiting, or problems with urination or bowel movements unless otherwise stated above.    Pertinent History Reviewed:  Medical & Surgical Hx:   Past Medical History:  Diagnosis Date  . Medical history non-contributory    Past Surgical History:  Procedure Laterality Date  . NO PAST SURGERIES     Family History  Problem Relation Age of Onset  . Hypertension Paternal Grandmother   . Hyperlipidemia Maternal Grandmother   . Hypertension Father   . Hyperlipidemia Father   . Hypertension Mother     Current Outpatient Medications:  .  ACCU-CHEK FASTCLIX LANCETS MISC, 1 each by Percutaneous route 4 (four) times daily., Disp: 100 each, Rfl: 12 .  acetaminophen (TYLENOL) 325 MG tablet, Take 325 mg by mouth as needed., Disp: , Rfl:  .  glucose blood (ACCU-CHEK GUIDE) test strip, Check blood sugar 4 times daily, Disp: 100 each, Rfl: 12 .  omeprazole (PRILOSEC) 20 MG capsule, Take 1 capsule (20 mg total) by mouth daily., Disp: 30 capsule, Rfl: 3 .  Prenatal Vit-Fe Fumarate-FA (PRENATAL VITAMIN PLUS LOW IRON) 27-1 MG TABS, TAKE 1 TABLET BY MOUTH ONCE DAILY AT 12 NOON, Disp: , Rfl: 12 Social History: Reviewed -  reports that she has never smoked. She has never used  smokeless tobacco.   Physical Assessment:   Vitals:   05/02/18 1333  BP: 128/89  Pulse: (!) 105  Weight: 244 lb 6.4 oz (110.9 kg)  Body mass index is 41.95 kg/m.           Physical Examination:   General appearance: alert, well appearing, and in no distress  Mental status: alert, oriented to person, place, and time  Skin: warm & dry   Extremities: Edema: None    Cardiovascular: normal heart rate noted  Respiratory: normal respiratory effort, no distress  Abdomen: gravid, soft, non-tender  Pelvic: Cervical exam deferred           Fetal Status: Fetal Heart Rate (bpm): 149 Fundal Height: 36 cm Movement: Present    Fetal Surveillance Testing today: doppler  Results for orders placed or performed in visit on 05/02/18 (from the past 24 hour(s))  POC Urinalysis Dipstick OB   Collection Time: 05/02/18  1:38 PM  Result Value Ref Range   Color, UA     Clarity, UA     Glucose, UA Negative Negative   Bilirubin, UA     Ketones, UA neg    Spec Grav, UA     Blood, UA neg    pH, UA     POC,PROTEIN,UA Negative Negative, Trace, Small (1+), Moderate (2+), Large (3+), 4+   Urobilinogen, UA     Nitrite, UA neg    Leukocytes, UA Negative Negative   Appearance  Odor      Assessment & Plan:  1) High-risk pregnancy G1P0 at 4934w6d with an Estimated Date of Delivery: 06/14/18   2) A1DM, stable.  Treatment Plan:  Continue QID blood sugar testing, EFW 36-38 weeks, IOL 40 weeks   Follow-up: Return in about 2 weeks (around 05/16/2018) for HROB.  No future appointments.  Orders Placed This Encounter  Procedures  . POC Urinalysis Dipstick OB   Jacklyn ShellFrances Cresenzo-Dishmon CNM 05/02/2018 2:01 PM

## 2018-05-17 ENCOUNTER — Ambulatory Visit (INDEPENDENT_AMBULATORY_CARE_PROVIDER_SITE_OTHER): Payer: Medicaid Other | Admitting: Women's Health

## 2018-05-17 ENCOUNTER — Encounter: Payer: Self-pay | Admitting: Women's Health

## 2018-05-17 VITALS — BP 127/83 | HR 103 | Wt 250.0 lb

## 2018-05-17 DIAGNOSIS — O0993 Supervision of high risk pregnancy, unspecified, third trimester: Secondary | ICD-10-CM

## 2018-05-17 DIAGNOSIS — O24410 Gestational diabetes mellitus in pregnancy, diet controlled: Secondary | ICD-10-CM

## 2018-05-17 DIAGNOSIS — Z3483 Encounter for supervision of other normal pregnancy, third trimester: Secondary | ICD-10-CM

## 2018-05-17 DIAGNOSIS — Z3A36 36 weeks gestation of pregnancy: Secondary | ICD-10-CM

## 2018-05-17 DIAGNOSIS — Z1389 Encounter for screening for other disorder: Secondary | ICD-10-CM

## 2018-05-17 DIAGNOSIS — Z331 Pregnant state, incidental: Secondary | ICD-10-CM

## 2018-05-17 LAB — POCT URINALYSIS DIPSTICK OB
Glucose, UA: NEGATIVE
Ketones, UA: NEGATIVE
LEUKOCYTES UA: NEGATIVE
NITRITE UA: NEGATIVE
PROTEIN: NEGATIVE
RBC UA: NEGATIVE

## 2018-05-17 NOTE — Addendum Note (Signed)
Addended by: Federico FlakeNES, PEGGY A on: 05/17/2018 11:09 AM   Modules accepted: Orders

## 2018-05-17 NOTE — Patient Instructions (Signed)
Claude Mangesiana Ford, I greatly value your feedback.  If you receive a survey following your visit with us today, we appreciate you taking the time to fill it out.  Thanks, Joellyn HaffKim Glady Ouderkirk, CNM, WHNP-BC   Call the office [phone]) or [facility] if:  You begin to have strong, frequent contractions  Your water breaks.  Sometimes it is a big gush of fluid, sometimes it is just a trickle that keeps getting your panties wet or running down your legs  You have vaginal bleeding.  It is normal to have a small amount of spotting if your cervix was checked.   You don't feel your baby moving like normal.  If you don't, get you something to eat and drink and lay down and focus on feeling your baby move.  You should feel at least 10 movements in 2 hours.  If you don't, you should call the office or [facility].     Preterm Labor and Birth Information  The normal length of a pregnancy is 39-41 weeks. Preterm labor is when labor starts before 37 completed weeks of pregnancy. What are the risk factors for preterm labor? Preterm labor is more likely to occur in women who:  Have certain infections during pregnancy such as a bladder infection, sexually transmitted infection, or infection inside the uterus (chorioamnionitis).  Have a shorter-than-normal cervix.  Have gone into preterm labor before.  Have had surgery on their cervix.  Are younger than age 22 or older than age 22.  Are African American.  Are pregnant with twins or multiple babies (multiple gestation).  Take street drugs or smoke while pregnant.  Do not gain enough weight while pregnant.  Became pregnant shortly after having been pregnant. What are the symptoms of preterm labor? Symptoms of preterm labor include:  Cramps similar to those that can happen during a menstrual period. The cramps may happen with diarrhea.  Pain in the abdomen or lower back.  Regular uterine contractions that may feel like tightening of  the abdomen.  A feeling of increased pressure in the pelvis.  Increased watery or bloody mucus discharge from the vagina.  Water breaking (ruptured amniotic sac). Why is it important to recognize signs of preterm labor? It is important to recognize signs of preterm labor because babies who are born prematurely may not be fully developed. This can put them at an increased risk for:  Long-term (chronic) heart and lung problems.  Difficulty immediately after birth with regulating body systems, including blood sugar, body temperature, heart rate, and breathing rate.  Bleeding in the brain.  Cerebral palsy.  Learning difficulties.  Death. These risks are highest for babies who are born before 34 weeks of pregnancy. How is preterm labor treated? Treatment depends on the length of your pregnancy, your condition, and the health of your baby. It may involve:  Having a stitch (suture) placed in your cervix to prevent your cervix from opening too early (cerclage).  Taking or being given medicines, such as: ? Hormone medicines. These may be given early in pregnancy to help support the pregnancy. ? Medicine to stop contractions. ? Medicines to help mature the baby's lungs. These may be prescribed if the risk of delivery is high. ? Medicines to prevent your baby from developing cerebral palsy. If the labor happens before 34 weeks of pregnancy, you may need to stay in the hospital. What should I do if I think I am in preterm labor? If you think that you  are going into preterm labor, call your health care provider right away. How can I prevent preterm labor in future pregnancies? To increase your chance of having a full-term pregnancy:  Do not use any tobacco products, such as cigarettes, chewing tobacco, and e-cigarettes. If you need help quitting, ask your health care provider.  Do not use street drugs or medicines that have not been prescribed to you during your pregnancy.  Talk with  your health care provider before taking any herbal supplements, even if you have been taking them regularly.  Make sure you gain a healthy amount of weight during your pregnancy.  Watch for infection. If you think that you might have an infection, get it checked right away.  Make sure to tell your health care provider if you have gone into preterm labor before. This information is not intended to replace advice given to you by your health care provider. Make sure you discuss any questions you have with your health care provider. Document Released: 07/30/2003 Document Revised: 10/20/2015 Document Reviewed: 09/30/2015 Elsevier Interactive Patient Education  2019 [employer]

## 2018-05-17 NOTE — Progress Notes (Signed)
   HIGH-RISK PREGNANCY VISIT Patient name: Alison Sandoval MRN [id number]  Date of birth: 1996-05-11 Chief Complaint:   High Risk Gestation (a lot vaginal pressure)  History of Present Illness:   Alison Mangesiana Piper is a 22 y.o. G1P0 female at 5814w0d with an Estimated Date of Delivery: 06/14/18 being seen today for ongoing management of a high-risk pregnancy complicated by A1DM.  Today she reports grabbed wrong notebook- reports all FBS <95, all 2hr pp <120 except maybe 2 supper time checks/wk- will send pic of numbers via mychart when she gets home. Contractions: Irregular. Vag. Bleeding: None.  Movement: Present. denies leaking of fluid.  Review of Systems:   Pertinent items are noted in HPI Denies abnormal vaginal discharge w/ itching/odor/irritation, headaches, visual changes, shortness of breath, chest pain, abdominal pain, severe nausea/vomiting, or problems with urination or bowel movements unless otherwise stated above. Pertinent History Reviewed:  Reviewed past medical,surgical, social, obstetrical and family history.  Reviewed problem list, medications and allergies. Physical Assessment:   Vitals:   05/17/18 1011  BP: 127/83  Pulse: (!) 103  Weight: 250 lb (113.4 kg)  Body mass index is 42.91 kg/m.           Physical Examination:   General appearance: alert, well appearing, and in no distress  Mental status: alert, oriented to person, place, and time  Skin: warm & dry   Extremities: Edema: None    Cardiovascular: normal heart rate noted  Respiratory: normal respiratory effort, no distress  Abdomen: gravid, soft, non-tender  Pelvic: Cervical exam performed  Dilation: 1 Effacement (%): Thick Station: Ballotable  Fetal Status: Fetal Heart Rate (bpm): 152 Fundal Height: 38 cm Movement: Present Presentation: Vertex  Fetal Surveillance Testing today: doppler   Results for orders placed or performed in visit on 05/17/18 (from the past 24 hour(s))  POC Urinalysis Dipstick OB   Collection Time: 05/17/18 10:18 AM  Result Value Ref Range   Color, UA     Clarity, UA     Glucose, UA Negative Negative   Bilirubin, UA     Ketones, UA neg    Spec Grav, UA     Blood, UA neg    pH, UA     POC,PROTEIN,UA Negative Negative, Trace, Small (1+), Moderate (2+), Large (3+), 4+   Urobilinogen, UA     Nitrite, UA neg    Leukocytes, UA Negative Negative   Appearance     Odor      Assessment & Plan:  1) High-risk pregnancy G1P0 at 3114w0d with an Estimated Date of Delivery: 06/14/18   2) A1DM, stable, to send pic of sugars via mychart when she gets home  Meds: No orders of the defined types were placed in this encounter.  Labs/procedures today: gbs, gc/ct, sve  Treatment Plan:  Growth u/s @ 36wk     Deliver @ 39-40wks  Reviewed: Preterm labor symptoms and general obstetric precautions including but not limited to vaginal bleeding, contractions, leaking of fluid and fetal movement were reviewed in detail with the patient.  All questions were answered.  Follow-up: Return in about 1 week (around 05/24/2018) for HROB, US:EFW.  Orders Placed This Encounter  Procedures  . US OB Follow Up  . POC Urinalysis Dipstick OB   Cheral MarkerKimberly R Majed Pellegrin CNM, [facility] 05/17/2018 10:39 AM

## 2018-05-19 LAB — GC/CHLAMYDIA PROBE AMP
Chlamydia trachomatis, NAA: NEGATIVE
Neisseria gonorrhoeae by PCR: NEGATIVE

## 2018-05-19 LAB — STREP GP B NAA: STREP GROUP B AG: POSITIVE — AB

## 2018-05-21 ENCOUNTER — Encounter: Payer: Self-pay | Admitting: Women's Health

## 2018-05-24 ENCOUNTER — Ambulatory Visit (INDEPENDENT_AMBULATORY_CARE_PROVIDER_SITE_OTHER): Payer: Medicaid Other | Admitting: Obstetrics & Gynecology

## 2018-05-24 ENCOUNTER — Other Ambulatory Visit: Payer: Self-pay

## 2018-05-24 ENCOUNTER — Encounter: Payer: Self-pay | Admitting: Obstetrics & Gynecology

## 2018-05-24 ENCOUNTER — Ambulatory Visit (INDEPENDENT_AMBULATORY_CARE_PROVIDER_SITE_OTHER): Payer: Medicaid Other

## 2018-05-24 VITALS — BP 134/78 | HR 106 | Wt 248.0 lb

## 2018-05-24 DIAGNOSIS — Z331 Pregnant state, incidental: Secondary | ICD-10-CM

## 2018-05-24 DIAGNOSIS — Z3A37 37 weeks gestation of pregnancy: Secondary | ICD-10-CM

## 2018-05-24 DIAGNOSIS — O24410 Gestational diabetes mellitus in pregnancy, diet controlled: Secondary | ICD-10-CM | POA: Diagnosis not present

## 2018-05-24 DIAGNOSIS — O0993 Supervision of high risk pregnancy, unspecified, third trimester: Secondary | ICD-10-CM

## 2018-05-24 DIAGNOSIS — O24419 Gestational diabetes mellitus in pregnancy, unspecified control: Secondary | ICD-10-CM

## 2018-05-24 DIAGNOSIS — O0990 Supervision of high risk pregnancy, unspecified, unspecified trimester: Secondary | ICD-10-CM

## 2018-05-24 DIAGNOSIS — Z1389 Encounter for screening for other disorder: Secondary | ICD-10-CM

## 2018-05-24 NOTE — Progress Notes (Signed)
[place] 37 wks,cephalic,fhr 150 bpm,anterior placenta gr 3,afi 21 cm,normal ovaries bilat,efw 3202 g 67%,limited view

## 2018-05-24 NOTE — Progress Notes (Signed)
   HIGH-RISK PREGNANCY VISIT Patient name: Alison Sandoval MRN [id number]  Date of birth: 1996/03/11 Chief Complaint:   High Risk Gestation (u/s today)  History of Present Illness:   Alison Sandoval is a 23 y.o. G1P0 female at [redacted]w[redacted]d with an Estimated Date of Delivery: 06/14/18 being seen today for ongoing management of a high-risk pregnancy complicated by class  A1 DM.  Today she reports no complaints. Contractions: Irregular. Vag. Bleeding: None.  Movement: Present. denies leaking of fluid.  Review of Systems:   Pertinent items are noted in HPI Denies abnormal vaginal discharge w/ itching/odor/irritation, headaches, visual changes, shortness of breath, chest pain, abdominal pain, severe nausea/vomiting, or problems with urination or bowel movements unless otherwise stated above. Pertinent History Reviewed:  Reviewed past medical,surgical, social, obstetrical and family history.  Reviewed problem list, medications and allergies. Physical Assessment:   Vitals:   05/24/18 1535  BP: 134/78  Pulse: (!) 106  Weight: 248 lb (112.5 kg)  Body mass index is 42.57 kg/m.           Physical Examination:   General appearance: alert, well appearing, and in no distress  Mental status: alert, oriented to person, place, and time  Skin: warm & dry   Extremities: Edema: Trace    Cardiovascular: normal heart rate noted  Respiratory: normal respiratory effort, no distress  Abdomen: gravid, soft, non-tender  Pelvic: Cervical exam deferred         Fetal Status:     Movement: Present    Fetal Surveillance Testing today: sonogram   No results found for this or any previous visit (from the past 24 hour(s)).  Assessment & Plan:  1) High-risk pregnancy G1P0 at [redacted]w[redacted]d with an Estimated Date of Delivery: 06/14/18   2) Class A1 DM, stable, EFW 67%  BP creeping    Meds: No orders of the defined types were placed in this encounter.   Labs/procedures today: sonogram  Treatment Plan:  IOL 40 weeks or as  indicated  Reviewed: Term labor symptoms and general obstetric precautions including but not limited to vaginal bleeding, contractions, leaking of fluid and fetal movement were reviewed in detail with the patient.  All questions were answered.  Follow-up: Return in about 1 week (around 05/31/2018) for HROB, with Dr Despina Hidden.  Orders Placed This Encounter  Procedures  . POC Urinalysis Dipstick OB   Lazaro Arms 05/24/2018 3:51 PM

## 2018-05-31 ENCOUNTER — Encounter: Payer: Self-pay | Admitting: Obstetrics & Gynecology

## 2018-05-31 ENCOUNTER — Other Ambulatory Visit: Payer: Self-pay

## 2018-05-31 ENCOUNTER — Ambulatory Visit (INDEPENDENT_AMBULATORY_CARE_PROVIDER_SITE_OTHER): Payer: Medicaid Other | Admitting: Obstetrics & Gynecology

## 2018-05-31 VITALS — BP 131/81 | HR 112 | Wt 249.0 lb

## 2018-05-31 DIAGNOSIS — O0993 Supervision of high risk pregnancy, unspecified, third trimester: Secondary | ICD-10-CM

## 2018-05-31 DIAGNOSIS — Z331 Pregnant state, incidental: Secondary | ICD-10-CM

## 2018-05-31 DIAGNOSIS — O24410 Gestational diabetes mellitus in pregnancy, diet controlled: Secondary | ICD-10-CM

## 2018-05-31 DIAGNOSIS — Z3A38 38 weeks gestation of pregnancy: Secondary | ICD-10-CM

## 2018-05-31 DIAGNOSIS — Z1389 Encounter for screening for other disorder: Secondary | ICD-10-CM

## 2018-05-31 LAB — POCT URINALYSIS DIPSTICK OB
Blood, UA: NEGATIVE
GLUCOSE, UA: NEGATIVE
KETONES UA: NEGATIVE
Leukocytes, UA: NEGATIVE
Nitrite, UA: NEGATIVE
POC,PROTEIN,UA: NEGATIVE

## 2018-05-31 NOTE — Progress Notes (Signed)
   HIGH-RISK PREGNANCY VISIT Patient name: Alison Sandoval MRN [id number]  Date of birth: 1995/09/14 Chief Complaint:   High Risk Gestation  History of Present Illness:   Alison Sandoval is a 23 y.o. G1P0 female at [redacted]w[redacted]d with an Estimated Date of Delivery: 06/14/18 being seen today for ongoing management of a high-risk pregnancy complicated by gestational DM.  Today she reports no complaints. Contractions: Irregular. Vag. Bleeding: None.  Movement: Present. denies leaking of fluid.  Review of Systems:   Pertinent items are noted in HPI Denies abnormal vaginal discharge w/ itching/odor/irritation, headaches, visual changes, shortness of breath, chest pain, abdominal pain, severe nausea/vomiting, or problems with urination or bowel movements unless otherwise stated above. Pertinent History Reviewed:  Reviewed past medical,surgical, social, obstetrical and family history.  Reviewed problem list, medications and allergies. Physical Assessment:   Vitals:   05/31/18 1531  BP: 131/81  Pulse: (!) 112  Weight: 249 lb (112.9 kg)  Body mass index is 42.74 kg/m.           Physical Examination:   General appearance: alert, well appearing, and in no distress  Mental status: alert, oriented to person, place, and time  Skin: warm & dry   Extremities: Edema: Trace    Cardiovascular: normal heart rate noted  Respiratory: normal respiratory effort, no distress  Abdomen: gravid, soft, non-tender  Pelvic: Cervical exam deferred         Fetal Status:     Movement: Present    Fetal Surveillance Testing today: FHR 164   Results for orders placed or performed in visit on 05/31/18 (from the past 24 hour(s))  POC Urinalysis Dipstick OB   Collection Time: 05/31/18  3:34 PM  Result Value Ref Range   Color, UA     Clarity, UA     Glucose, UA Negative Negative   Bilirubin, UA     Ketones, UA neg    Spec Grav, UA     Blood, UA neg    pH, UA     POC,PROTEIN,UA Negative Negative, Trace, Small (1+),  Moderate (2+), Large (3+), 4+   Urobilinogen, UA     Nitrite, UA neg    Leukocytes, UA Negative Negative   Appearance     Odor      Assessment & Plan:  1) High-risk pregnancy G1P0 at [redacted]w[redacted]d with an Estimated Date of Delivery: 06/14/18   2) Class A1 DM, stable, excellent CBG    Meds: No orders of the defined types were placed in this encounter.   Labs/procedures today:   Treatment Plan:  IOL 40 weeks or as indicated  Reviewed: Term labor symptoms and general obstetric precautions including but not limited to vaginal bleeding, contractions, leaking of fluid and fetal movement were reviewed in detail with the patient.  All questions were answered.  Follow-up: Return in about 1 week (around 06/07/2018) for HROB.  Orders Placed This Encounter  Procedures  . POC Urinalysis Dipstick OB   Amaryllis Dyke Eure  05/31/2018 4:04 PM

## 2018-06-02 ENCOUNTER — Inpatient Hospital Stay (HOSPITAL_COMMUNITY)
Admission: AD | Admit: 2018-06-02 | Discharge: 2018-06-02 | Disposition: A | Discharge status: Home / Self Care | Payer: Medicaid Other | Source: Ambulatory Visit | Attending: Obstetrics & Gynecology | Admitting: Obstetrics & Gynecology

## 2018-06-02 ENCOUNTER — Other Ambulatory Visit: Payer: Self-pay

## 2018-06-02 ENCOUNTER — Encounter (HOSPITAL_COMMUNITY): Payer: Self-pay | Admitting: *Deleted

## 2018-06-02 DIAGNOSIS — Z3A38 38 weeks gestation of pregnancy: Secondary | ICD-10-CM | POA: Insufficient documentation

## 2018-06-02 DIAGNOSIS — O471 False labor at or after 37 completed weeks of gestation: Secondary | ICD-10-CM

## 2018-06-02 DIAGNOSIS — O0990 Supervision of high risk pregnancy, unspecified, unspecified trimester: Secondary | ICD-10-CM

## 2018-06-02 HISTORY — DX: Type 2 diabetes mellitus without complications: E11.9

## 2018-06-02 HISTORY — DX: Gastro-esophageal reflux disease without esophagitis: K21.9

## 2018-06-02 HISTORY — DX: Headache, unspecified: R51.9

## 2018-06-02 HISTORY — DX: Chronic kidney disease, unspecified: N18.9

## 2018-06-02 HISTORY — DX: Headache: R51

## 2018-06-02 HISTORY — DX: Gestational diabetes mellitus in pregnancy, unspecified control: O24.419

## 2018-06-02 NOTE — Discharge Instructions (Signed)

## 2018-06-02 NOTE — MAU Provider Note (Signed)
S: Ms. Alison Sandoval is a 23 y.o. G1P0 at [redacted]w[redacted]d  who presents to MAU today complaining contractions irregularly since Friday. She was 2 cm at her appointment on 05/31/2018. She denies vaginal bleeding. She denies LOF. She reports normal fetal movement.    O: BP 130/72 (BP Location: Left Arm)   Pulse (!) 104   Temp 97.9 F (36.6 C) (Oral)   Resp 16   Ht 5\' 4"  (1.626 m)   Wt 113.5 kg   LMP 09/07/2017 (Approximate)   SpO2 100%   BMI 42.93 kg/m  GENERAL: Well-developed, well-nourished female in no acute distress.  HEAD: Normocephalic, atraumatic.  CHEST: Normal effort of breathing, regular heart rate ABDOMEN: Soft, nontender, gravid  Cervical exam:  Dilation: 1.5 Effacement (%): 50 Cervical Position: Middle Station: -2, -3 Presentation: Vertex Exam by:: K. WeissRN   Fetal Monitoring: Baseline: 135 Variability: mod  Accelerations: present Decelerations: neg Contractions: irregular   A: SIUP at [redacted]w[redacted]d  False labor  P: -Discharge home with labor precautions.  -Reviewed warning signs and when to return to MAU.   Marylene Land, CNM 06/02/2018 4:09 AM

## 2018-06-02 NOTE — MAU Note (Signed)
Pt c/o contractions since Thursday. Has been having worse cntx since Friday in back and tailbone.  Denies LOF or bleeding. + FM Might have lost mucous plug. Was 2 cm at appt on Thursday.

## 2018-06-07 ENCOUNTER — Encounter: Payer: Self-pay | Admitting: Family Medicine

## 2018-06-07 ENCOUNTER — Ambulatory Visit (INDEPENDENT_AMBULATORY_CARE_PROVIDER_SITE_OTHER): Payer: Medicaid Other | Admitting: Family Medicine

## 2018-06-07 VITALS — BP 118/75 | HR 96 | Wt 247.4 lb

## 2018-06-07 DIAGNOSIS — Z1389 Encounter for screening for other disorder: Secondary | ICD-10-CM

## 2018-06-07 DIAGNOSIS — Z3A39 39 weeks gestation of pregnancy: Secondary | ICD-10-CM

## 2018-06-07 DIAGNOSIS — O24410 Gestational diabetes mellitus in pregnancy, diet controlled: Secondary | ICD-10-CM

## 2018-06-07 DIAGNOSIS — O0993 Supervision of high risk pregnancy, unspecified, third trimester: Secondary | ICD-10-CM

## 2018-06-07 DIAGNOSIS — O0990 Supervision of high risk pregnancy, unspecified, unspecified trimester: Secondary | ICD-10-CM

## 2018-06-07 DIAGNOSIS — Z331 Pregnant state, incidental: Secondary | ICD-10-CM

## 2018-06-07 LAB — POCT URINALYSIS DIPSTICK OB
Blood, UA: NEGATIVE
Glucose, UA: NEGATIVE
Leukocytes, UA: NEGATIVE
Nitrite, UA: NEGATIVE
POC,PROTEIN,UA: NEGATIVE

## 2018-06-07 NOTE — Progress Notes (Signed)
   HIGH-RISK PREGNANCY VISIT Patient name: Alison Sandoval MRN [id number]  Date of birth: 1995/08/23 Chief Complaint:   Routine Prenatal Visit  History of Present Illness:   Alison Sandoval is a 23 y.o. G1P0 female at [redacted]w[redacted]d with an Estimated Date of Delivery: 06/14/18 being seen today for ongoing management of a high-risk pregnancy complicated by gestational DM.  Today she reports no complaints. Contractions: Irregular. Vag. Bleeding: None.  Movement: Present. denies leaking of fluid.  Review of Systems:   Pertinent items are noted in HPI Denies abnormal vaginal discharge w/ itching/odor/irritation, headaches, visual changes, shortness of breath, chest pain, abdominal pain, severe nausea/vomiting, or problems with urination or bowel movements unless otherwise stated above. Pertinent History Reviewed:  Reviewed past medical,surgical, social, obstetrical and family history.  Reviewed problem list, medications and allergies. Physical Assessment:   Vitals:   06/07/18 1555  BP: 118/75  Pulse: 96  Weight: 247 lb 6.4 oz (112.2 kg)  Body mass index is 42.47 kg/m.           Physical Examination:   General appearance: alert, well appearing, and in no distress  Mental status: alert, oriented to person, place, and time  Skin: warm & dry   Extremities: Edema: Trace    Cardiovascular: normal heart rate noted  Respiratory: normal respiratory effort, no distress  Abdomen: gravid, soft, non-tender  Pelvic: Cervical exam deferred         Fetal Status:     Movement: Present    Fetal Surveillance Testing today: FHR 164   Results for orders placed or performed in visit on 06/07/18 (from the past 24 hour(s))  POC Urinalysis Dipstick OB   Collection Time: 06/07/18  4:03 PM  Result Value Ref Range   Color, UA     Clarity, UA     Glucose, UA Negative Negative   Bilirubin, UA     Ketones, UA small    Spec Grav, UA     Blood, UA neg    pH, UA     POC,PROTEIN,UA Negative Negative, Trace, Small  (1+), Moderate (2+), Large (3+), 4+   Urobilinogen, UA     Nitrite, UA neg    Leukocytes, UA Negative Negative   Appearance     Odor      Assessment & Plan:  1) High-risk pregnancy G1P0 at [redacted]w[redacted]d  with an Estimated Date of Delivery: 06/14/18   2) Class A1 DM, stable, excellent CBG    Meds: No orders of the defined types were placed in this encounter.   Labs/procedures today:   Treatment Plan:  IOL 40 weeks - scheduled today  Reviewed: Term labor symptoms and general obstetric precautions including but not limited to vaginal bleeding, contractions, leaking of fluid and fetal movement were reviewed in detail with the patient.  All questions were answered.  Follow-up: Return in about 1 week (around 06/14/2018) for Routine prenatal care.   Future Appointments  Date Time Provider Department Center  06/14/2018 11:45 AM Lazaro Arms, MD [facility]  06/15/2018  6:30 AM [facility] None  07/26/2018  2:30 PM Cresenzo-Dishmon, Scarlette Calico, CNM [facility]     Orders Placed This Encounter  Procedures  . POC Urinalysis Dipstick OB   Isa Rankin [facility]  06/07/2018 4:06 PM

## 2018-06-07 NOTE — Patient Instructions (Signed)
Come to the MAU (maternity admission unit) for 1) Strong contractions every 2-3 minutes for at least 1 hour that do not go away when you drink water or take a warm shower. These contractions will be so strong all you can do is breath through them 2) Vaginal bleeding- anything more than spotting 3) Loss of fluid like you broke your water 4) Decreased movement of your baby       Breastfeeding  Choosing to breastfeed is one of the best decisions you can make for yourself and your baby. A change in hormones during pregnancy causes your breasts to make breast milk in your milk-producing glands. Hormones prevent breast milk from being released before your baby is born. They also prompt milk flow after birth. Once breastfeeding has begun, thoughts of your baby, as well as his or her sucking or crying, can stimulate the release of milk from your milk-producing glands. Benefits of breastfeeding Research shows that breastfeeding offers many health benefits for infants and mothers. It also offers a cost-free and convenient way to feed your baby. For your baby  Your first milk (colostrum) helps your baby's digestive system to function better.  Special cells in your milk (antibodies) help your baby to fight off infections.  Breastfed babies are less likely to develop asthma, allergies, obesity, or type 2 diabetes. They are also at lower risk for sudden infant death syndrome (SIDS).  Nutrients in breast milk are better able to meet your baby's needs compared to infant formula.  Breast milk improves your baby's brain development. For you  Breastfeeding helps to create a very special bond between you and your baby.  Breastfeeding is convenient. Breast milk costs nothing and is always available at the correct temperature.  Breastfeeding helps to burn calories. It helps you to lose the weight that you gained during pregnancy.  Breastfeeding makes your uterus return faster to its size before  pregnancy. It also slows bleeding (lochia) after you give birth.  Breastfeeding helps to lower your risk of developing type 2 diabetes, osteoporosis, rheumatoid arthritis, cardiovascular disease, and breast, ovarian, uterine, and endometrial cancer later in life. Breastfeeding basics Starting breastfeeding  Find a comfortable place to sit or lie down, with your neck and back well-supported.  Place a pillow or a rolled-up blanket under your baby to bring him or her to the level of your breast (if you are seated). Nursing pillows are specially designed to help support your arms and your baby while you breastfeed.  Make sure that your baby's tummy (abdomen) is facing your abdomen.  Gently massage your breast. With your fingertips, massage from the outer edges of your breast inward toward the nipple. This encourages milk flow. If your milk flows slowly, you may need to continue this action during the feeding.  Support your breast with 4 fingers underneath and your thumb above your nipple (make the letter "C" with your hand). Make sure your fingers are well away from your nipple and your baby's mouth.  Stroke your baby's lips gently with your finger or nipple.  When your baby's mouth is open wide enough, quickly bring your baby to your breast, placing your entire nipple and as much of the areola as possible into your baby's mouth. The areola is the colored area around your nipple. ? More areola should be visible above your baby's upper lip than below the lower lip. ? Your baby's lips should be opened and extended outward (flanged) to ensure an adequate, comfortable latch. ? Your   baby's tongue should be between his or her lower gum and your breast.  Make sure that your baby's mouth is correctly positioned around your nipple (latched). Your baby's lips should create a seal on your breast and be turned out (everted).  It is common for your baby to suck about 2-3 minutes in order to start the flow of  breast milk. Latching Teaching your baby how to latch onto your breast properly is very important. An improper latch can cause nipple pain, decreased milk supply, and poor weight gain in your baby. Also, if your baby is not latched onto your nipple properly, he or she may swallow some air during feeding. This can make your baby fussy. Burping your baby when you switch breasts during the feeding can help to get rid of the air. However, teaching your baby to latch on properly is still the best way to prevent fussiness from swallowing air while breastfeeding. Signs that your baby has successfully latched onto your nipple  Silent tugging or silent sucking, without causing you pain. Infant's lips should be extended outward (flanged).  Swallowing heard between every 3-4 sucks once your milk has started to flow (after your let-down milk reflex occurs).  Muscle movement above and in front of his or her ears while sucking. Signs that your baby has not successfully latched onto your nipple  Sucking sounds or smacking sounds from your baby while breastfeeding.  Nipple pain. If you think your baby has not latched on correctly, slip your finger into the corner of your baby's mouth to break the suction and place it between your baby's gums. Attempt to start breastfeeding again. Signs of successful breastfeeding Signs from your baby  Your baby will gradually decrease the number of sucks or will completely stop sucking.  Your baby will fall asleep.  Your baby's body will relax.  Your baby will retain a small amount of milk in his or her mouth.  Your baby will let go of your breast by himself or herself. Signs from you  Breasts that have increased in firmness, weight, and size 1-3 hours after feeding.  Breasts that are softer immediately after breastfeeding.  Increased milk volume, as well as a change in milk consistency and color by the fifth day of breastfeeding.  Nipples that are not sore,  cracked, or bleeding. Signs that your baby is getting enough milk  Wetting at least 1-2 diapers during the first 24 hours after birth.  Wetting at least 5-6 diapers every 24 hours for the first week after birth. The urine should be clear or pale yellow by the age of 5 days.  Wetting 6-8 diapers every 24 hours as your baby continues to grow and develop.  At least 3 stools in a 24-hour period by the age of 5 days. The stool should be soft and yellow.  At least 3 stools in a 24-hour period by the age of 7 days. The stool should be seedy and yellow.  No loss of weight greater than 10% of birth weight during the first 3 days of life.  Average weight gain of 4-7 oz (113-198 g) per week after the age of 4 days.  Consistent daily weight gain by the age of 5 days, without weight loss after the age of 2 weeks. After a feeding, your baby may spit up a small amount of milk. This is normal. Breastfeeding frequency and duration Frequent feeding will help you make more milk and can prevent sore nipples and extremely full   breasts (breast engorgement). Breastfeed when you feel the need to reduce the fullness of your breasts or when your baby shows signs of hunger. This is called "breastfeeding on demand." Signs that your baby is hungry include:  Increased alertness, activity, or restlessness.  Movement of the head from side to side.  Opening of the mouth when the corner of the mouth or cheek is stroked (rooting).  Increased sucking sounds, smacking lips, cooing, sighing, or squeaking.  Hand-to-mouth movements and sucking on fingers or hands.  Fussing or crying. Avoid introducing a pacifier to your baby in the first 4-6 weeks after your baby is born. After this time, you may choose to use a pacifier. Research has shown that pacifier use during the first year of a baby's life decreases the risk of sudden infant death syndrome (SIDS). Allow your baby to feed on each breast as long as he or she wants.  When your baby unlatches or falls asleep while feeding from the first breast, offer the second breast. Because newborns are often sleepy in the first few weeks of life, you may need to awaken your baby to get him or her to feed. Breastfeeding times will vary from baby to baby. However, the following rules can serve as a guide to help you make sure that your baby is properly fed:  Newborns (babies 4 weeks of age or younger) may breastfeed every 1-3 hours.  Newborns should not go without breastfeeding for longer than 3 hours during the day or 5 hours during the night.  You should breastfeed your baby a minimum of 8 times in a 24-hour period. Breast milk pumping     Pumping and storing breast milk allows you to make sure that your baby is exclusively fed your breast milk, even at times when you are unable to breastfeed. This is especially important if you go back to work while you are still breastfeeding, or if you are not able to be present during feedings. Your lactation consultant can help you find a method of pumping that works best for you and give you guidelines about how long it is safe to store breast milk. Caring for your breasts while you breastfeed Nipples can become dry, cracked, and sore while breastfeeding. The following recommendations can help keep your breasts moisturized and healthy:  Avoid using soap on your nipples.  Wear a supportive bra designed especially for nursing. Avoid wearing underwire-style bras or extremely tight bras (sports bras).  Air-dry your nipples for 3-4 minutes after each feeding.  Use only cotton bra pads to absorb leaked breast milk. Leaking of breast milk between feedings is normal.  Use lanolin on your nipples after breastfeeding. Lanolin helps to maintain your skin's normal moisture barrier. Pure lanolin is not harmful (not toxic) to your baby. You may also hand express a few drops of breast milk and gently massage that milk into your nipples and  allow the milk to air-dry. In the first few weeks after giving birth, some women experience breast engorgement. Engorgement can make your breasts feel heavy, warm, and tender to the touch. Engorgement peaks within 3-5 days after you give birth. The following recommendations can help to ease engorgement:  Completely empty your breasts while breastfeeding or pumping. You may want to start by applying warm, moist heat (in the shower or with warm, water-soaked hand towels) just before feeding or pumping. This increases circulation and helps the milk flow. If your baby does not completely empty your breasts while breastfeeding, pump   any extra milk after he or she is finished.  Apply ice packs to your breasts immediately after breastfeeding or pumping, unless this is too uncomfortable for you. To do this: ? Put ice in a plastic bag. ? Place a towel between your skin and the bag. ? Leave the ice on for 20 minutes, 2-3 times a day.  Make sure that your baby is latched on and positioned properly while breastfeeding. If engorgement persists after 48 hours of following these recommendations, contact your health care provider or a lactation consultant. Overall health care recommendations while breastfeeding  Eat 3 healthy meals and 3 snacks every day. Well-nourished mothers who are breastfeeding need an additional 450-500 calories a day. You can meet this requirement by increasing the amount of a balanced diet that you eat.  Drink enough water to keep your urine pale yellow or clear.  Rest often, relax, and continue to take your prenatal vitamins to prevent fatigue, stress, and low vitamin and mineral levels in your body (nutrient deficiencies).  Do not use any products that contain nicotine or tobacco, such as cigarettes and e-cigarettes. Your baby may be harmed by chemicals from cigarettes that pass into breast milk and exposure to secondhand smoke. If you need help quitting, ask your health care  provider.  Avoid alcohol.  Do not use illegal drugs or marijuana.  Talk with your health care provider before taking any medicines. These include over-the-counter and prescription medicines as well as vitamins and herbal supplements. Some medicines that may be harmful to your baby can pass through breast milk.  It is possible to become pregnant while breastfeeding. If birth control is desired, ask your health care provider about options that will be safe while breastfeeding your baby. Where to find more information: [employer]: www.llli.org Contact a health care provider if:  You feel like you want to stop breastfeeding or have become frustrated with breastfeeding.  Your nipples are cracked or bleeding.  Your breasts are red, tender, or warm.  You have: ? Painful breasts or nipples. ? A swollen area on either breast. ? A fever or chills. ? Nausea or vomiting. ? Drainage other than breast milk from your nipples.  Your breasts do not become full before feedings by the fifth day after you give birth.  You feel sad and depressed.  Your baby is: ? Too sleepy to eat well. ? Having trouble sleeping. ? More than 1 week old and wetting fewer than 6 diapers in a 24-hour period. ? Not gaining weight by 5 days of age.  Your baby has fewer than 3 stools in a 24-hour period.  Your baby's skin or the white parts of his or her eyes become yellow. Get help right away if:  Your baby is overly tired (lethargic) and does not want to wake up and feed.  Your baby develops an unexplained fever. Summary  Breastfeeding offers many health benefits for infant and mothers.  Try to breastfeed your infant when he or she shows early signs of hunger.  Gently tickle or stroke your baby's lips with your finger or nipple to allow the baby to open his or her mouth. Bring the baby to your breast. Make sure that much of the areola is in your baby's mouth. Offer one side and burp the  baby before you offer the other side.  Talk with your health care provider or lactation consultant if you have questions or you face problems as you breastfeed. This information is   not intended to replace advice given to you by your health care provider. Make sure you discuss any questions you have with your health care provider. Document Released: 05/09/2005 Document Revised: 06/10/2016 Document Reviewed: 06/10/2016 Elsevier Interactive Patient Education  2019 [employer]  

## 2018-06-07 NOTE — Progress Notes (Signed)
IOL scheduled for 06/15/2018 at 0630 per Dr. Alvester MorinNewton. Patient to come for foley insertion 06/14/2018.

## 2018-06-08 ENCOUNTER — Telehealth (HOSPITAL_COMMUNITY): Payer: Self-pay | Admitting: *Deleted

## 2018-06-08 ENCOUNTER — Encounter (HOSPITAL_COMMUNITY): Payer: Self-pay | Admitting: *Deleted

## 2018-06-08 NOTE — Telephone Encounter (Signed)
Preadmission screen  

## 2018-06-10 NOTE — Progress Notes (Signed)
Induction Assessment Scheduling Form Fax to [facility]:  [phone]  Alison Sandoval                                                                                   DOB:  1996/02/07                                                            MRN:  [id number]                                                                                      Provider:  [facility]  GP:  G1P0                                                            Estimated Date of Delivery: 06/14/18  Dating Criteria: L/6wk [place]    Medical Indications for induction:  Gestational diabetes, diet controlled Admission Date/Time:  06/15/2018 Gestational age on admission:  40w1   Filed Weights   06/07/18 1555  Weight: 247 lb 6.4 oz (112.2 kg)   HIV:  Non Reactive (11/07 0911) GBS: Positive (12/26 1130)  1.5 cm dilated, 50 effaced, -3 station   Method of induction(proposed):  FB, cytotec   Scheduling Provider Signature:  Federico Flake, MD                                            Today's Date:  06/10/2018

## 2018-06-11 ENCOUNTER — Other Ambulatory Visit: Payer: Self-pay | Admitting: Family Medicine

## 2018-06-14 ENCOUNTER — Other Ambulatory Visit: Payer: Self-pay

## 2018-06-14 ENCOUNTER — Ambulatory Visit (INDEPENDENT_AMBULATORY_CARE_PROVIDER_SITE_OTHER): Payer: Medicaid Other | Admitting: Obstetrics & Gynecology

## 2018-06-14 ENCOUNTER — Encounter: Payer: Self-pay | Admitting: Obstetrics & Gynecology

## 2018-06-14 VITALS — BP 130/83 | HR 111 | Wt 247.0 lb

## 2018-06-14 DIAGNOSIS — Z331 Pregnant state, incidental: Secondary | ICD-10-CM

## 2018-06-14 DIAGNOSIS — Z1389 Encounter for screening for other disorder: Secondary | ICD-10-CM

## 2018-06-14 DIAGNOSIS — O24410 Gestational diabetes mellitus in pregnancy, diet controlled: Secondary | ICD-10-CM | POA: Diagnosis not present

## 2018-06-14 DIAGNOSIS — O0990 Supervision of high risk pregnancy, unspecified, unspecified trimester: Secondary | ICD-10-CM

## 2018-06-14 DIAGNOSIS — Z3A40 40 weeks gestation of pregnancy: Secondary | ICD-10-CM | POA: Diagnosis not present

## 2018-06-14 DIAGNOSIS — O0993 Supervision of high risk pregnancy, unspecified, third trimester: Secondary | ICD-10-CM

## 2018-06-14 LAB — POCT URINALYSIS DIPSTICK OB
Blood, UA: NEGATIVE
Glucose, UA: NEGATIVE
Ketones, UA: NEGATIVE
Leukocytes, UA: NEGATIVE
Nitrite, UA: NEGATIVE
POC,PROTEIN,UA: NEGATIVE

## 2018-06-14 NOTE — Progress Notes (Signed)
   HIGH-RISK PREGNANCY VISIT Patient name: Alison Sandoval MRN [id number]  Date of birth: 20-Aug-1995 Chief Complaint:   High Risk Gestation (NST and foley bulb)  History of Present Illness:   Alison Sandoval is a 23 y.o. G1P0 female at [redacted]w[redacted]d with an Estimated Date of Delivery: 06/14/18 being seen today for ongoing management of a high-risk pregnancy complicated by gestational DM.  Today she reports no complaints. Contractions: Irregular. Vag. Bleeding: None.  Movement: Present. denies leaking of fluid.  Review of Systems:   Pertinent items are noted in HPI Denies abnormal vaginal discharge w/ itching/odor/irritation, headaches, visual changes, shortness of breath, chest pain, abdominal pain, severe nausea/vomiting, or problems with urination or bowel movements unless otherwise stated above. Pertinent History Reviewed:  Reviewed past medical,surgical, social, obstetrical and family history.  Reviewed problem list, medications and allergies. Physical Assessment:   Vitals:   06/14/18 1211  BP: 130/83  Pulse: (!) 111  Weight: 247 lb (112 kg)  Body mass index is 42.4 kg/m.           Physical Examination:   General appearance: alert, well appearing, and in no distress  Mental status: alert, oriented to person, place, and time  Skin: warm & dry   Extremities: Edema: Trace    Cardiovascular: normal heart rate noted  Respiratory: normal respiratory effort, no distress  Abdomen: gravid, soft, non-tender  Pelvic: Cervical exam deferred         Fetal Status:     Movement: Present    Fetal Surveillance Testing today: reactive NST   Results for orders placed or performed in visit on 06/14/18 (from the past 24 hour(s))  POC Urinalysis Dipstick OB   Collection Time: 06/14/18 12:10 PM  Result Value Ref Range   Color, UA     Clarity, UA     Glucose, UA Negative Negative   Bilirubin, UA     Ketones, UA neg    Spec Grav, UA     Blood, UA neg    pH, UA     POC,PROTEIN,UA Negative Negative,  Trace, Small (1+), Moderate (2+), Large (3+), 4+   Urobilinogen, UA     Nitrite, UA neg    Leukocytes, UA Negative Negative   Appearance     Odor      Assessment & Plan:  1) High-risk pregnancy G1P0 at [redacted]w[redacted]d with an Estimated Date of Delivery: 06/14/18   2) Class A1 DM, stable, CBG good, foley bulb placed     Meds: No orders of the defined types were placed in this encounter.   Labs/procedures today: reactive NST  Treatment Plan:  IOL tomorrow  Reviewed: Term labor symptoms and general obstetric precautions including but not limited to vaginal bleeding, contractions, leaking of fluid and fetal movement were reviewed in detail with the patient.  All questions were answered.  Follow-up: Return in about 6 weeks (around 07/26/2018) for post partum visit.  Orders Placed This Encounter  Procedures  . POC Urinalysis Dipstick OB   Lazaro Arms  06/14/2018 12:58 PM

## 2018-06-15 ENCOUNTER — Inpatient Hospital Stay (HOSPITAL_COMMUNITY)
Admission: RE | Admit: 2018-06-15 | Discharge: 2018-06-19 | DRG: 788 | Disposition: A | Discharge status: Home / Self Care | Payer: Medicaid Other | Attending: Obstetrics and Gynecology | Admitting: Obstetrics and Gynecology

## 2018-06-15 ENCOUNTER — Inpatient Hospital Stay (HOSPITAL_COMMUNITY): Payer: Medicaid Other | Admitting: Anesthesiology

## 2018-06-15 DIAGNOSIS — O26893 Other specified pregnancy related conditions, third trimester: Secondary | ICD-10-CM | POA: Diagnosis present

## 2018-06-15 DIAGNOSIS — O99214 Obesity complicating childbirth: Secondary | ICD-10-CM | POA: Diagnosis present

## 2018-06-15 DIAGNOSIS — Z3A40 40 weeks gestation of pregnancy: Secondary | ICD-10-CM | POA: Diagnosis not present

## 2018-06-15 DIAGNOSIS — O99824 Streptococcus B carrier state complicating childbirth: Secondary | ICD-10-CM | POA: Diagnosis present

## 2018-06-15 DIAGNOSIS — O24425 Gestational diabetes mellitus in childbirth, controlled by oral hypoglycemic drugs: Secondary | ICD-10-CM | POA: Diagnosis not present

## 2018-06-15 DIAGNOSIS — O411230 Chorioamnionitis, third trimester, not applicable or unspecified: Secondary | ICD-10-CM | POA: Diagnosis not present

## 2018-06-15 DIAGNOSIS — O24420 Gestational diabetes mellitus in childbirth, diet controlled: Secondary | ICD-10-CM | POA: Diagnosis present

## 2018-06-15 DIAGNOSIS — Z6791 Unspecified blood type, Rh negative: Secondary | ICD-10-CM

## 2018-06-15 DIAGNOSIS — O9962 Diseases of the digestive system complicating childbirth: Secondary | ICD-10-CM | POA: Diagnosis present

## 2018-06-15 DIAGNOSIS — K219 Gastro-esophageal reflux disease without esophagitis: Secondary | ICD-10-CM | POA: Diagnosis present

## 2018-06-15 DIAGNOSIS — O24419 Gestational diabetes mellitus in pregnancy, unspecified control: Secondary | ICD-10-CM | POA: Diagnosis present

## 2018-06-15 DIAGNOSIS — O480 Post-term pregnancy: Secondary | ICD-10-CM | POA: Diagnosis not present

## 2018-06-15 LAB — GLUCOSE, CAPILLARY
GLUCOSE-CAPILLARY: 71 mg/dL (ref 70–99)
GLUCOSE-CAPILLARY: 69 mg/dL — AB (ref 70–99)
Glucose-Capillary: 88 mg/dL (ref 70–99)
Glucose-Capillary: 81 mg/dL (ref 70–99)
Glucose-Capillary: 52 mg/dL — ABNORMAL LOW (ref 70–99)
Glucose-Capillary: 60 mg/dL — ABNORMAL LOW (ref 70–99)
Glucose-Capillary: 58 mg/dL — ABNORMAL LOW (ref 70–99)
Glucose-Capillary: 85 mg/dL (ref 70–99)
Glucose-Capillary: 66 mg/dL — ABNORMAL LOW (ref 70–99)
Glucose-Capillary: 84 mg/dL (ref 70–99)
Glucose-Capillary: 101 mg/dL — ABNORMAL HIGH (ref 70–99)

## 2018-06-15 LAB — TYPE AND SCREEN
ABO/RH(D): O POS
Antibody Screen: NEGATIVE

## 2018-06-15 LAB — CBC
HCT: 35.4 % — ABNORMAL LOW (ref 36.0–46.0)
Hemoglobin: 11.4 g/dL — ABNORMAL LOW (ref 12.0–15.0)
MCH: 25.8 pg — ABNORMAL LOW (ref 26.0–34.0)
MCHC: 32.2 g/dL (ref 30.0–36.0)
MCV: 80.1 fL (ref 80.0–100.0)
Platelets: 267 K/uL (ref 150–400)
RBC: 4.42 MIL/uL (ref 3.87–5.11)
RDW: 15.7 % — ABNORMAL HIGH (ref 11.5–15.5)
WBC: 13.1 K/uL — ABNORMAL HIGH (ref 4.0–10.5)
nRBC: 0 % (ref 0.0–0.2)

## 2018-06-15 LAB — RPR: RPR Ser Ql: NONREACTIVE

## 2018-06-15 LAB — ABO/RH: ABO/RH(D): O POS

## 2018-06-15 MED ORDER — LIDOCAINE HCL (PF) 1 % IJ SOLN
30.0000 mL | INTRAMUSCULAR | Status: DC | PRN
  Filled 2018-06-15: qty 30

## 2018-06-15 MED ORDER — TERBUTALINE SULFATE 1 MG/ML IJ SOLN
0.2500 mg | Freq: Once | INTRAMUSCULAR | Status: AC | PRN
  Administered 2018-06-16: 0.25 mg via SUBCUTANEOUS
  Filled 2018-06-15: qty 1

## 2018-06-15 MED ORDER — FENTANYL 2.5 MCG/ML BUPIVACAINE 1/10 % EPIDURAL INFUSION (WH - ANES)
14.0000 mL/h | INTRAMUSCULAR | Status: DC | PRN
  Administered 2018-06-15 – 2018-06-16 (×3): 14 mL/h via EPIDURAL
  Filled 2018-06-15 (×3): qty 100

## 2018-06-15 MED ORDER — ONDANSETRON HCL 4 MG/2ML IJ SOLN
4.0000 mg | Freq: Four times a day (QID) | INTRAMUSCULAR | Status: DC | PRN
  Administered 2018-06-15 – 2018-06-16 (×2): 4 mg via INTRAVENOUS
  Filled 2018-06-15: qty 2

## 2018-06-15 MED ORDER — DEXTROSE IN LACTATED RINGERS 5 % IV SOLN
INTRAVENOUS | Status: DC
  Administered 2018-06-15 – 2018-06-16 (×2): via INTRAVENOUS

## 2018-06-15 MED ORDER — LIDOCAINE HCL (PF) 1 % IJ SOLN
INTRAMUSCULAR | Status: DC | PRN
  Administered 2018-06-15 (×2): 4 mL via EPIDURAL

## 2018-06-15 MED ORDER — EPHEDRINE 5 MG/ML INJ: 10.0000 mg | INTRAVENOUS | Status: DC | PRN

## 2018-06-15 MED ORDER — LACTATED RINGERS IV SOLN: 500.0000 mL | Freq: Once | INTRAVENOUS | Status: DC

## 2018-06-15 MED ORDER — LACTATED RINGERS IV SOLN
500.0000 mL | INTRAVENOUS | Status: DC | PRN
  Administered 2018-06-15: 500 mL via INTRAVENOUS

## 2018-06-15 MED ORDER — OXYTOCIN BOLUS FROM INFUSION: 500.0000 mL | Freq: Once | INTRAVENOUS | Status: DC

## 2018-06-15 MED ORDER — PHENYLEPHRINE 40 MCG/ML (10ML) SYRINGE FOR IV PUSH (FOR BLOOD PRESSURE SUPPORT): 80.0000 ug | PREFILLED_SYRINGE | INTRAVENOUS | Status: DC | PRN

## 2018-06-15 MED ORDER — LACTATED RINGERS IV SOLN
500.0000 mL | Freq: Once | INTRAVENOUS | Status: AC
  Administered 2018-06-15: 500 mL via INTRAVENOUS

## 2018-06-15 MED ORDER — MISOPROSTOL 25 MCG QUARTER TABLET: 25.0000 ug | ORAL_TABLET | ORAL | Status: DC | PRN

## 2018-06-15 MED ORDER — PHENYLEPHRINE 40 MCG/ML (10ML) SYRINGE FOR IV PUSH (FOR BLOOD PRESSURE SUPPORT)
80.0000 ug | PREFILLED_SYRINGE | INTRAVENOUS | Status: DC | PRN
  Filled 2018-06-15: qty 10

## 2018-06-15 MED ORDER — SOD CITRATE-CITRIC ACID 500-334 MG/5ML PO SOLN
30.0000 mL | ORAL | Status: DC | PRN
  Administered 2018-06-16: 30 mL via ORAL
  Filled 2018-06-15: qty 15

## 2018-06-15 MED ORDER — OXYTOCIN 40 UNITS IN NORMAL SALINE INFUSION - SIMPLE MED
1.0000 m[IU]/min | INTRAVENOUS | Status: DC
  Administered 2018-06-15: 2 m[IU]/min via INTRAVENOUS
  Administered 2018-06-15: 4 m[IU]/min via INTRAVENOUS
  Administered 2018-06-16: 6 m[IU]/min via INTRAVENOUS
  Filled 2018-06-15: qty 1000

## 2018-06-15 MED ORDER — FENTANYL CITRATE (PF) 100 MCG/2ML IJ SOLN: 100.0000 ug | INTRAMUSCULAR | Status: DC | PRN

## 2018-06-15 MED ORDER — OXYTOCIN 40 UNITS IN NORMAL SALINE INFUSION - SIMPLE MED: 2.5000 [IU]/h | INTRAVENOUS | Status: DC

## 2018-06-15 MED ORDER — SODIUM CHLORIDE 0.9 % IV SOLN
5.0000 10*6.[IU] | Freq: Once | INTRAVENOUS | Status: AC
  Administered 2018-06-15: 5 10*6.[IU] via INTRAVENOUS
  Filled 2018-06-15: qty 5

## 2018-06-15 MED ORDER — FLEET ENEMA 7-19 GM/118ML RE ENEM: 1.0000 | ENEMA | RECTAL | Status: DC | PRN

## 2018-06-15 MED ORDER — PENICILLIN G 3 MILLION UNITS IVPB - SIMPLE MED
3.0000 10*6.[IU] | INTRAVENOUS | Status: DC
  Administered 2018-06-15 – 2018-06-16 (×4): 3 10*6.[IU] via INTRAVENOUS
  Filled 2018-06-15 (×5): qty 100

## 2018-06-15 MED ORDER — DIPHENHYDRAMINE HCL 50 MG/ML IJ SOLN: 12.5000 mg | INTRAMUSCULAR | Status: DC | PRN

## 2018-06-15 MED ORDER — LACTATED RINGERS IV SOLN
INTRAVENOUS | Status: DC
  Administered 2018-06-15 (×3): via INTRAVENOUS

## 2018-06-15 NOTE — Progress Notes (Signed)
LABOR PROGRESS NOTE  Alison Sandoval is a 23 y.o. G1P0 at [redacted]w[redacted]d  admitted for IOL for A1GDM.  Subjective: Patient seen resting in bed. AROM performed at 1305 showing light meconium. IUPC placed directly after.   Objective: BP (!) 102/56   Pulse 88   Temp 98 F (36.7 C) (Oral)   Resp 16   LMP 09/07/2017 (Approximate)   SpO2 100%  or  Vitals:   06/15/18 1100 06/15/18 1130 06/15/18 1200 06/15/18 1230  BP: 128/69 101/67 95/68 (!) 102/56  Pulse: 97 93 95 88  Resp: 18 18 16 16   Temp: 98 F (36.7 C)     TempSrc: Oral     SpO2:       Dilation: 6.5 Effacement (%): 70 Cervical Position: Posterior Station: -2 Presentation: Vertex Exam by:: Johnathan Hausen RN  FHT: baseline rate 135, moderate varibility, 10x10acel, early/variable decel Toco: AROMed  Labs: Lab Results  Component Value Date   WBC 13.1 (H) 06/15/2018   HGB 11.4 (L) 06/15/2018   HCT 35.4 (L) 06/15/2018   MCV 80.1 06/15/2018   PLT 267 06/15/2018    Patient Active Problem List   Diagnosis Date Noted  . Gestational diabetes 03/30/2018  . Gastroesophageal reflux in pregnancy 03/01/2018  . Rubella non-immune status, antepartum 11/14/2017  . Supervision of high risk pregnancy, antepartum 11/07/2017    Assessment / Plan: 23 y.o. G1P0 at [redacted]w[redacted]d here for IOL for A1GDM.  Labor: Active Fetal Wellbeing:  Category 1 Pain Control:  Epidural Anticipated MOD:  Vaginal   Peggyann Shoals, DO [facility], PGY-1 06/15/2018 1:14 PM

## 2018-06-15 NOTE — Progress Notes (Signed)
Hypoglycemic Event  CBG:66   Treatment: 8 oz juice/soda  Symptoms: Shaky  Follow-up CBG: Time:1915 CBG Result 84  Possible Reasons for Event: Inadequate meal intake  Comments/MD notified:Dr. Ephriam Knuckles

## 2018-06-15 NOTE — Anesthesia Preprocedure Evaluation (Addendum)
Anesthesia Evaluation  Patient identified by MRN, date of birth, ID band Patient awake    Reviewed: Allergy & Precautions, Patient's Chart, lab work & pertinent test results  History of Anesthesia Complications Negative for: history of anesthetic complications  Airway Mallampati: II  TM Distance: >3 FB Neck ROM: Full    Dental  (+) Teeth Intact   Pulmonary neg pulmonary ROS,    Pulmonary exam normal breath sounds clear to auscultation       Cardiovascular negative cardio ROS Normal cardiovascular exam Rhythm:Regular Rate:Normal     Neuro/Psych negative neurological ROS     GI/Hepatic Neg liver ROS, GERD  Medicated and Controlled,  Endo/Other  diabetes, GestationalMorbid obesity  Renal/GU negative Renal ROS     Musculoskeletal negative musculoskeletal ROS (+)   Abdominal   Peds  Hematology negative hematology ROS (+)   Anesthesia Other Findings Day of surgery medications reviewed with the patient.  Reproductive/Obstetrics (+) Pregnancy                             Anesthesia Physical Anesthesia Plan  ASA: III and emergent  Anesthesia Plan: Epidural   Post-op Pain Management:    Induction:   PONV Risk Score and Plan: Treatment may vary due to age or medical condition  Airway Management Planned: Natural Airway  Additional Equipment:   Intra-op Plan:   Post-operative Plan:   Informed Consent: I have reviewed the patients History and Physical, chart, labs and discussed the procedure including the risks, benefits and alternatives for the proposed anesthesia with the patient or authorized representative who has indicated his/her understanding and acceptance.       Plan Discussed with: CRNA  Anesthesia Plan Comments:        Anesthesia Quick Evaluation

## 2018-06-15 NOTE — Progress Notes (Signed)
Hypoglycemic Event  CBG: 60  Treatment: 4 oz juice/soda  Symptoms: Hungry  Follow-up CBG: Time:1410 CBG Result:71  Possible Reasons for Event: Inadequate meal intake  Comments/MD notified: Notified     [name]

## 2018-06-15 NOTE — Progress Notes (Signed)
Hypoglycemic Event  CBG: 52   Treatment: 4 oz juice/soda  Symptoms: Shaky  Follow-up CBG: Time:1350  CBG Result:60  Possible Reasons for Event: Inadequate meal intake  Comments/MD notified: Dr. Earlene Plater notified      Alison Sandoval

## 2018-06-15 NOTE — Progress Notes (Signed)
Foley Bulb out at 0747, provider notified.  Orders to start pitocin.  Will continue to monitor.  Johnathan Hausen RN

## 2018-06-15 NOTE — H&P (Addendum)
LABOR AND DELIVERY ADMISSION HISTORY AND PHYSICAL NOTE  Alison Sandoval is a 23 y.o. female G1P0 with IUP at [redacted]w[redacted]d by LMP/1st-trimester Ultrasound presenting for IOL for A1GDM.   She reports positive fetal movement. She denies leakage of fluid or vaginal bleeding.  Prenatal History/Complications: PNC at [facility]. Pregnancy complications:  - A1GDM, EFW 67%  -Rubella non immune   Past Medical History: Past Medical History:  Diagnosis Date  . Chronic kidney disease    UTI  . Diabetes mellitus without complication (HCC)   . GERD (gastroesophageal reflux disease)   . Gestational diabetes   . Headache   . Medical history non-contributory     Past Surgical History: Past Surgical History:  Procedure Laterality Date  . NO PAST SURGERIES      Obstetrical History: OB History    Gravida  1   Para      Term      Preterm      AB      Living        SAB      TAB      Ectopic      Multiple      Live Births              Social History: Social History   Socioeconomic History  . Marital status: Single    Spouse name: Not on file  . Number of children: Not on file  . Years of education: Not on file  . Highest education level: Not on file  Occupational History  . Not on file  Social Needs  . Financial resource strain: Not hard at all  . Food insecurity:    Worry: Never true    Inability: Never true  . Transportation needs:    Medical: No    Non-medical: Not on file  Tobacco Use  . Smoking status: Never Smoker  . Smokeless tobacco: Never Used  Substance and Sexual Activity  . Alcohol use: Not Currently    Comment: social  . Drug use: No  . Sexual activity: Yes    Birth control/protection: None    Comment: last sex 29 May 2017  Lifestyle  . Physical activity:    Days per week: Not on file    Minutes per session: Not on file  . Stress: To some extent  Relationships  . Social connections:    Talks on phone: Not on file    Gets together: Not on file     Attends religious service: Not on file    Active member of club or organization: Not on file    Attends meetings of clubs or organizations: Not on file    Relationship status: Not on file  Other Topics Concern  . Not on file  Social History Narrative  . Not on file    Family History: Family History  Problem Relation Age of Onset  . Hypertension Paternal Grandmother   . Hyperlipidemia Maternal Grandmother   . Hypertension Father   . Hyperlipidemia Father   . Hypertension Mother   . Asthma Mother     Allergies: Allergies  Allergen Reactions  . Caramel Rash    Medications Prior to Admission  Medication Sig Dispense Refill Last Dose  . ACCU-CHEK FASTCLIX LANCETS MISC 1 each by Percutaneous route 4 (four) times daily. 100 each 12 06/14/2018 at Unknown time  . acetaminophen (TYLENOL) 325 MG tablet Take 325 mg by mouth as needed for mild pain.    06/14/2018 at Unknown time  .  glucose blood (ACCU-CHEK GUIDE) test strip Check blood sugar 4 times daily 100 each 12 06/14/2018 at Unknown time  . omeprazole (PRILOSEC) 20 MG capsule Take 1 capsule (20 mg total) by mouth daily. 30 capsule 3 06/14/2018 at Unknown time  . Prenatal Vit-Fe Fumarate-FA (PRENATAL VITAMIN PLUS LOW IRON) 27-1 MG TABS TAKE 1 TABLET BY MOUTH ONCE DAILY AT 12 NOON  12 06/14/2018 at Unknown time    Review of Systems  All systems reviewed and negative except as stated in HPI  Physical Exam Blood pressure 129/68, pulse 100, temperature 97.7 F (36.5 C), temperature source Oral, resp. rate 20, last menstrual period 09/07/2017, SpO2 100 %. General appearance: alert, oriented, NAD Lungs: normal respiratory effort Heart: regular rate Abdomen: soft, non-tender; gravid, FH appropriate for GA Extremities: No calf swelling or tenderness Presentation: cephalic Fetal monitoring: baseline 145, moderate variability, 15x15 accels, variable decels Dilation: 6.5 Effacement (%): 70 Station: -2 Exam by:: Herma Carson RN    Prenatal labs: ABO, Rh: --/--/O POS (01/24 3354) Antibody: NEG (01/24 5625) Rubella: <0.90 (06/18 1527) RPR: Non Reactive (11/07 0911)  HBsAg: Negative (06/18 1527)  HIV: Non Reactive (11/07 0911)  GC/Chlamydia: negative/negative GBS: Positive (12/26 1130)  2-hr GTT: 100/149/114 Genetic screening:  Within normal limits Anatomy US: Within normal limits, female  Prenatal Transfer Tool  Maternal Diabetes: Yes:  Diabetes Type:  Diet controlled A1GDM Genetic Screening: Normal Maternal Ultrasounds/Referrals: Normal Fetal Ultrasounds or other Referrals:  None Maternal Substance Abuse:  No Significant Maternal Medications:  Meds include: Nexium Significant Maternal Lab Results: Lab values include: Group B Strep positive, Rh negative  Results for orders placed or performed during the hospital encounter of 06/15/18 (from the past 24 hour(s))  CBC   Collection Time: 06/15/18  7:38 AM  Result Value Ref Range   WBC 13.1 (H) 4.0 - 10.5 K/uL   RBC 4.42 3.87 - 5.11 MIL/uL   Hemoglobin 11.4 (L) 12.0 - 15.0 g/dL   HCT 63.8 (L) 93.7 - 34.2 %   MCV 80.1 80.0 - 100.0 fL   MCH 25.8 (L) 26.0 - 34.0 pg   MCHC 32.2 30.0 - 36.0 g/dL   RDW 87.6 (H) 81.1 - 57.2 %   Platelets 267 150 - 400 K/uL   nRBC 0.0 0.0 - 0.2 %  Type and screen   Collection Time: 06/15/18  7:38 AM  Result Value Ref Range   ABO/RH(D) O POS    Antibody Screen NEG    Sample Expiration      06/18/2018 Performed at [facility], [address], [place], [place] [address]   Glucose, capillary   Collection Time: 06/15/18  8:00 AM  Result Value Ref Range   Glucose-Capillary 88 70 - 99 mg/dL   Comment 1 Notify RN    Comment 2 Document in Chart   Results for orders placed or performed in visit on 06/14/18 (from the past 24 hour(s))  POC Urinalysis Dipstick OB   Collection Time: 06/14/18 12:10 PM  Result Value Ref Range   Color, UA     Clarity, UA     Glucose, UA Negative Negative   Bilirubin, UA     Ketones, UA neg     Spec Grav, UA     Blood, UA neg    pH, UA     POC,PROTEIN,UA Negative Negative, Trace, Small (1+), Moderate (2+), Large (3+), 4+   Urobilinogen, UA     Nitrite, UA neg    Leukocytes, UA Negative Negative   Appearance  Odor      Patient Active Problem List   Diagnosis Date Noted  . Gestational diabetes 03/30/2018  . Gastroesophageal reflux in pregnancy 03/01/2018  . Rubella non-immune status, antepartum 11/14/2017  . Supervision of high risk pregnancy, antepartum 11/07/2017    Assessment: Alison Sandoval is a 23 y.o. G1P0 at 7746w1d here for IOL for A1GDM.  #Labor: Latent-active. Patient s/p outpatient FB. Will start Pitocin.  #Pain: Epidural #FWB: Category 1 #ID:  GBS positive - s/p PCN #MOF: Bottle #MOC Undecided, but interested in some form! Does not desire to have kids again soon. #Circ:  Ulyses AmorUndecided  Hannah C Anderson 06/15/2018, 10:30 AM   OB FELLOW HISTORY AND PHYSICAL ATTESTATION  I have seen and examined this patient; I agree with above documentation in the resident's note.   Marcy Sirenatherine Wallace, D.O. OB Fellow  06/15/2018, 1:53 PM

## 2018-06-15 NOTE — Progress Notes (Signed)
Educated patient on the the importance of changing position to protect skin integrity.  Pt verbalized understanding but did not want to turn. Stated she was "comfortable right now."  Agreed to change from semi fowlers to high fowlers.  Will continue to encourage position change.

## 2018-06-15 NOTE — Progress Notes (Signed)
Hypoglycemic Event  CBG 58  * Treatment: 8 oz juice/soda  Symptoms:  tired  Follow-up CBG: Time:1640  CBG Result 69   Possible Reasons for Event: Inadequate meal intake  Comments/MD notified:Dr. Dareen Piano Notified     [name]

## 2018-06-15 NOTE — Progress Notes (Signed)
LABOR PROGRESS NOTE  Ramonda Fedorowicz is a 23 y.o. G1P0 at [redacted]w[redacted]d  admitted for IOL for A1GDM.  Subjective: Patient is seen resting and relaxing in bed, reports fetal movement, denies leakage of fluids.  Objective: BP 114/61   Pulse (!) 115   Temp 97.7 F (36.5 C) (Oral)   Resp 18   LMP 09/07/2017 (Approximate)   SpO2 100%  or  Vitals:   06/15/18 1830 06/15/18 1900 06/15/18 1919 06/15/18 1934  BP: 117/78 115/65  114/61  Pulse: 99 (!) 115    Resp: 16 18  18   Temp:   97.7 F (36.5 C)   TempSrc:   Oral   SpO2:       Dilation: 7 Effacement (%): 80 Cervical Position: Posterior Station: 0, Plus 1 Presentation: Vertex Exam by:: Johnathan Hausen, RN FHT: baseline rate 145, moderate varibility, none acel, none decel Toco: Pitocin  Labs: Lab Results  Component Value Date   WBC 13.1 (H) 06/15/2018   HGB 11.4 (L) 06/15/2018   HCT 35.4 (L) 06/15/2018   MCV 80.1 06/15/2018   PLT 267 06/15/2018    Patient Active Problem List   Diagnosis Date Noted  . Gestational diabetes 03/30/2018  . Gastroesophageal reflux in pregnancy 03/01/2018  . Rubella non-immune status, antepartum 11/14/2017  . Supervision of high risk pregnancy, antepartum 11/07/2017    Assessment / Plan: 23 y.o. G1P0 at [redacted]w[redacted]d here for IOL for A1GDM.  Labor: Active Fetal Wellbeing:  Category 1 Pain Control:  Epidural Anticipated MOD:  Vaginal  Peggyann Shoals, DO [facility], PGY-1 06/15/2018 8:18 PM

## 2018-06-15 NOTE — Anesthesia Procedure Notes (Signed)
Epidural Patient location during procedure: OB Start time: 06/15/2018 9:52 AM End time: 06/15/2018 9:55 AM  Staffing Anesthesiologist: Kaylyn Layer, MD Performed: anesthesiologist   Preanesthetic Checklist Completed: patient identified, pre-op evaluation, timeout performed, IV checked, risks and benefits discussed and monitors and equipment checked  Epidural Patient position: sitting Prep: site prepped and draped and DuraPrep Patient monitoring: continuous pulse ox, blood pressure, heart rate and cardiac monitor Approach: midline Location: L3-L4 Injection technique: LOR air  Needle:  Needle type: Tuohy  Needle gauge: 17 G Needle length: 9 cm Needle insertion depth: 9 cm Catheter type: closed end flexible Catheter size: 19 Gauge Catheter at skin depth: 14 cm Test dose: negative and Other (1% lidocaine)  Assessment Events: blood not aspirated, injection not painful, no injection resistance, negative IV test and no paresthesia  Additional Notes Patient identified. Risks, benefits, and alternatives discussed with patient including but not limited to bleeding, infection, nerve damage, paralysis, failed block, incomplete pain control, headache, blood pressure changes, nausea, vomiting, reactions to medication, itching, and postpartum back pain. Confirmed with bedside nurse the patient's most recent platelet count. Confirmed with patient that they are not currently taking any anticoagulation, have any bleeding history, or any family history of bleeding disorders. Patient expressed understanding and wished to proceed. All questions were answered. Sterile technique was used throughout the entire procedure. Crisp LOR after several needle redirections. Please see nursing notes for vital signs. Test dose was given through epidural catheter and negative prior to continuing to dose epidural or start infusion. Warning signs of high block given to the patient including shortness of breath,  tingling/numbness in hands, complete motor block, or any concerning symptoms with instructions to call for help. Patient was given instructions on fall risk and not to get out of bed. All questions and concerns addressed with instructions to call with any issues or inadequate analgesia.  Reason for block:procedure for pain

## 2018-06-15 NOTE — Anesthesia Pain Management Evaluation Note (Signed)
  CRNA Pain Management Visit Note  Patient: Alison Sandoval, 23 y.o., female  "Hello I am a member of the anesthesia team at [facility]. We have an anesthesia team available at all times to provide care throughout the hospital, including epidural management and anesthesia for C-section. I don't know your plan for the delivery whether it a natural birth, water birth, IV sedation, nitrous supplementation, doula or epidural, but we want to meet your pain goals."   1.Was your pain managed to your expectations on prior hospitalizations?   No prior hospitalizations  2.What is your expectation for pain management during this hospitalization?     Epidural  3.How can we help you reach that goal? RN notified patient wants epidural now.  Record the patient's initial score and the patient's pain goal.   Pain: 7  Pain Goal: 5 The [facility] wants you to be able to say your pain was always managed very well.  Marylouise Mallet 06/15/2018

## 2018-06-15 NOTE — Progress Notes (Signed)
Hypoglycemic Event  CBG: 69  Treatment: 8 oz juice/soda  Symptoms: Hungry  Follow-up CBG: Time:1705 CBG Result:85  Possible Reasons for Event: Inadequate meal intake  Comments/MD notified:Dr. Leona Carry

## 2018-06-16 ENCOUNTER — Encounter (HOSPITAL_COMMUNITY)
Admission: RE | Disposition: A | Discharge status: Home / Self Care | Payer: Self-pay | Source: Home / Self Care | Attending: Obstetrics and Gynecology

## 2018-06-16 ENCOUNTER — Encounter (HOSPITAL_COMMUNITY): Payer: Self-pay

## 2018-06-16 DIAGNOSIS — O480 Post-term pregnancy: Secondary | ICD-10-CM

## 2018-06-16 DIAGNOSIS — O24425 Gestational diabetes mellitus in childbirth, controlled by oral hypoglycemic drugs: Secondary | ICD-10-CM

## 2018-06-16 DIAGNOSIS — Z3A40 40 weeks gestation of pregnancy: Secondary | ICD-10-CM

## 2018-06-16 LAB — CBC
HCT: 31.6 % — ABNORMAL LOW (ref 36.0–46.0)
Hemoglobin: 10.1 g/dL — ABNORMAL LOW (ref 12.0–15.0)
MCH: 25.4 pg — AB (ref 26.0–34.0)
MCHC: 32 g/dL (ref 30.0–36.0)
MCV: 79.4 fL — ABNORMAL LOW (ref 80.0–100.0)
PLATELETS: 223 10*3/uL (ref 150–400)
RBC: 3.98 MIL/uL (ref 3.87–5.11)
RDW: 15.7 % — ABNORMAL HIGH (ref 11.5–15.5)
WBC: 19.1 10*3/uL — ABNORMAL HIGH (ref 4.0–10.5)
nRBC: 0 % (ref 0.0–0.2)

## 2018-06-16 LAB — CREATININE, SERUM
Creatinine, Ser: 0.7 mg/dL (ref 0.44–1.00)
GFR calc Af Amer: >60 mL/min (ref >60)
GFR calc non Af Amer: >60 mL/min (ref >60)

## 2018-06-16 LAB — GLUCOSE, CAPILLARY
GLUCOSE-CAPILLARY: 171 mg/dL — AB (ref 70–99)
Glucose-Capillary: 120 mg/dL — ABNORMAL HIGH (ref 70–99)
Glucose-Capillary: 133 mg/dL — ABNORMAL HIGH (ref 70–99)

## 2018-06-16 SURGERY — Surgical Case
Anesthesia: Epidural

## 2018-06-16 MED ORDER — PHENYLEPHRINE HCL 10 MG/ML IJ SOLN
INTRAMUSCULAR | Status: DC | PRN
  Administered 2018-06-16: 80 ug via INTRAVENOUS

## 2018-06-16 MED ORDER — OXYTOCIN 10 UNIT/ML IJ SOLN
INTRAVENOUS | Status: DC | PRN
  Administered 2018-06-16: 40 [IU] via INTRAVENOUS

## 2018-06-16 MED ORDER — ONDANSETRON HCL 4 MG/2ML IJ SOLN: 4.0000 mg | Freq: Three times a day (TID) | INTRAMUSCULAR | Status: DC | PRN

## 2018-06-16 MED ORDER — COCONUT OIL OIL: 1.0000 "application " | TOPICAL_OIL | Status: DC | PRN

## 2018-06-16 MED ORDER — MEPERIDINE HCL 25 MG/ML IJ SOLN
INTRAMUSCULAR | Status: DC | PRN
  Administered 2018-06-16: 12.5 mg via INTRAVENOUS

## 2018-06-16 MED ORDER — SCOPOLAMINE 1 MG/3DAYS TD PT72: 1.0000 | MEDICATED_PATCH | Freq: Once | TRANSDERMAL | Status: DC

## 2018-06-16 MED ORDER — OXYTOCIN 10 UNIT/ML IJ SOLN
INTRAMUSCULAR | Status: AC
  Filled 2018-06-16: qty 4

## 2018-06-16 MED ORDER — WITCH HAZEL-GLYCERIN EX PADS: 1.0000 "application " | MEDICATED_PAD | CUTANEOUS | Status: DC | PRN

## 2018-06-16 MED ORDER — MEPERIDINE HCL 25 MG/ML IJ SOLN
INTRAMUSCULAR | Status: AC
  Filled 2018-06-16: qty 1

## 2018-06-16 MED ORDER — MEASLES, MUMPS & RUBELLA VAC IJ SOLR
0.5000 mL | Freq: Once | INTRAMUSCULAR | Status: AC
  Administered 2018-06-19: 0.5 mL via SUBCUTANEOUS
  Filled 2018-06-16 (×2): qty 0.5

## 2018-06-16 MED ORDER — PHENYLEPHRINE 40 MCG/ML (10ML) SYRINGE FOR IV PUSH (FOR BLOOD PRESSURE SUPPORT)
PREFILLED_SYRINGE | INTRAVENOUS | Status: AC
  Filled 2018-06-16: qty 10

## 2018-06-16 MED ORDER — FENTANYL CITRATE (PF) 100 MCG/2ML IJ SOLN: 25.0000 ug | INTRAMUSCULAR | Status: DC | PRN

## 2018-06-16 MED ORDER — NALBUPHINE HCL 10 MG/ML IJ SOLN: 5.0000 mg | INTRAMUSCULAR | Status: DC | PRN

## 2018-06-16 MED ORDER — ONDANSETRON HCL 4 MG/2ML IJ SOLN
INTRAMUSCULAR | Status: AC
  Filled 2018-06-16: qty 4

## 2018-06-16 MED ORDER — SODIUM CHLORIDE 0.9 % IV SOLN
500.0000 mg | Freq: Once | INTRAVENOUS | Status: AC
  Administered 2018-06-16: 500 mg via INTRAVENOUS
  Filled 2018-06-16: qty 500

## 2018-06-16 MED ORDER — GABAPENTIN 100 MG PO CAPS
100.0000 mg | ORAL_CAPSULE | Freq: Three times a day (TID) | ORAL | Status: DC
  Administered 2018-06-16 – 2018-06-18 (×8): 100 mg via ORAL
  Filled 2018-06-16 (×12): qty 1

## 2018-06-16 MED ORDER — IBUPROFEN 800 MG PO TABS
800.0000 mg | ORAL_TABLET | Freq: Three times a day (TID) | ORAL | Status: AC
  Administered 2018-06-16 – 2018-06-18 (×8): 800 mg via ORAL
  Filled 2018-06-16 (×9): qty 1

## 2018-06-16 MED ORDER — KETOROLAC TROMETHAMINE 30 MG/ML IJ SOLN
30.0000 mg | Freq: Once | INTRAMUSCULAR | Status: AC | PRN
  Administered 2018-06-16: 30 mg via INTRAVENOUS

## 2018-06-16 MED ORDER — SENNOSIDES-DOCUSATE SODIUM 8.6-50 MG PO TABS
2.0000 | ORAL_TABLET | ORAL | Status: DC
  Administered 2018-06-16 – 2018-06-18 (×3): 2 via ORAL
  Filled 2018-06-16 (×3): qty 2

## 2018-06-16 MED ORDER — TETANUS-DIPHTH-ACELL PERTUSSIS 5-2.5-18.5 LF-MCG/0.5 IM SUSP: 0.5000 mL | Freq: Once | INTRAMUSCULAR | Status: DC

## 2018-06-16 MED ORDER — DIPHENHYDRAMINE HCL 50 MG/ML IJ SOLN: 12.5000 mg | INTRAMUSCULAR | Status: DC | PRN

## 2018-06-16 MED ORDER — LACTATED RINGERS AMNIOINFUSION
INTRAVENOUS | Status: DC
  Administered 2018-06-16: 03:00:00 via INTRAUTERINE

## 2018-06-16 MED ORDER — SCOPOLAMINE 1 MG/3DAYS TD PT72
MEDICATED_PATCH | TRANSDERMAL | Status: DC | PRN
  Administered 2018-06-16: 1 via TRANSDERMAL

## 2018-06-16 MED ORDER — NALOXONE HCL 4 MG/10ML IJ SOLN
1.0000 ug/kg/h | INTRAVENOUS | Status: DC | PRN
  Filled 2018-06-16: qty 5

## 2018-06-16 MED ORDER — LIDOCAINE-EPINEPHRINE (PF) 2 %-1:200000 IJ SOLN
INTRAMUSCULAR | Status: AC
  Filled 2018-06-16: qty 20

## 2018-06-16 MED ORDER — CEFAZOLIN SODIUM-DEXTROSE 2-4 GM/100ML-% IV SOLN
INTRAVENOUS | Status: AC
  Filled 2018-06-16: qty 100

## 2018-06-16 MED ORDER — SIMETHICONE 80 MG PO CHEW
80.0000 mg | CHEWABLE_TABLET | ORAL | Status: DC
  Administered 2018-06-16 – 2018-06-18 (×3): 80 mg via ORAL
  Filled 2018-06-16 (×3): qty 1

## 2018-06-16 MED ORDER — OXYCODONE HCL 5 MG/5ML PO SOLN: 5.0000 mg | Freq: Once | ORAL | Status: DC | PRN

## 2018-06-16 MED ORDER — LACTATED RINGERS IV SOLN
INTRAVENOUS | Status: DC | PRN
  Administered 2018-06-16 (×2): via INTRAVENOUS

## 2018-06-16 MED ORDER — ENOXAPARIN SODIUM 60 MG/0.6ML ~~LOC~~ SOLN
55.0000 mg | SUBCUTANEOUS | Status: DC
  Administered 2018-06-16 – 2018-06-18 (×3): 55 mg via SUBCUTANEOUS
  Filled 2018-06-16 (×4): qty 0.6

## 2018-06-16 MED ORDER — DEXTROSE IN LACTATED RINGERS 5 % IV SOLN
INTRAVENOUS | Status: DC | PRN
  Administered 2018-06-16: 05:00:00 via INTRAVENOUS

## 2018-06-16 MED ORDER — SCOPOLAMINE 1 MG/3DAYS TD PT72
MEDICATED_PATCH | TRANSDERMAL | Status: AC
  Filled 2018-06-16: qty 1

## 2018-06-16 MED ORDER — MORPHINE SULFATE (PF) 0.5 MG/ML IJ SOLN
INTRAMUSCULAR | Status: DC | PRN
  Administered 2018-06-16: 3 mg via EPIDURAL

## 2018-06-16 MED ORDER — OXYCODONE HCL 5 MG PO TABS: 5.0000 mg | ORAL_TABLET | Freq: Once | ORAL | Status: DC | PRN

## 2018-06-16 MED ORDER — HYDROCODONE-ACETAMINOPHEN 5-325 MG PO TABS
1.0000 | ORAL_TABLET | ORAL | Status: DC | PRN
  Administered 2018-06-17 – 2018-06-19 (×3): 1 via ORAL
  Filled 2018-06-16 (×3): qty 1

## 2018-06-16 MED ORDER — NALBUPHINE HCL 10 MG/ML IJ SOLN: 5.0000 mg | Freq: Once | INTRAMUSCULAR | Status: DC | PRN

## 2018-06-16 MED ORDER — DIPHENHYDRAMINE HCL 25 MG PO CAPS
25.0000 mg | ORAL_CAPSULE | ORAL | Status: DC | PRN
  Filled 2018-06-16: qty 1

## 2018-06-16 MED ORDER — SODIUM BICARBONATE 8.4 % IV SOLN
INTRAVENOUS | Status: AC
  Filled 2018-06-16: qty 50

## 2018-06-16 MED ORDER — CEFAZOLIN SODIUM-DEXTROSE 2-4 GM/100ML-% IV SOLN
2.0000 g | Freq: Once | INTRAVENOUS | Status: AC
  Administered 2018-06-16: 2 g via INTRAVENOUS

## 2018-06-16 MED ORDER — SODIUM CHLORIDE 0.9 % IR SOLN
Status: DC | PRN
  Administered 2018-06-16: 1000 mL

## 2018-06-16 MED ORDER — DIPHENHYDRAMINE HCL 25 MG PO CAPS: 25.0000 mg | ORAL_CAPSULE | Freq: Four times a day (QID) | ORAL | Status: DC | PRN

## 2018-06-16 MED ORDER — SIMETHICONE 80 MG PO CHEW
80.0000 mg | CHEWABLE_TABLET | ORAL | Status: DC | PRN
  Administered 2018-06-17: 80 mg via ORAL

## 2018-06-16 MED ORDER — LACTATED RINGERS IV SOLN
INTRAVENOUS | Status: DC
  Administered 2018-06-16: 22:00:00 via INTRAVENOUS

## 2018-06-16 MED ORDER — SIMETHICONE 80 MG PO CHEW
80.0000 mg | CHEWABLE_TABLET | Freq: Three times a day (TID) | ORAL | Status: DC
  Administered 2018-06-16 – 2018-06-19 (×8): 80 mg via ORAL
  Filled 2018-06-16 (×10): qty 1

## 2018-06-16 MED ORDER — ONDANSETRON HCL 4 MG/2ML IJ SOLN: 4.0000 mg | Freq: Once | INTRAMUSCULAR | Status: DC | PRN

## 2018-06-16 MED ORDER — NALOXONE HCL 0.4 MG/ML IJ SOLN: 0.4000 mg | INTRAMUSCULAR | Status: DC | PRN

## 2018-06-16 MED ORDER — PRENATAL MULTIVITAMIN CH
1.0000 | ORAL_TABLET | Freq: Every day | ORAL | Status: DC
  Administered 2018-06-16 – 2018-06-18 (×3): 1 via ORAL
  Filled 2018-06-16 (×4): qty 1

## 2018-06-16 MED ORDER — KETOROLAC TROMETHAMINE 30 MG/ML IJ SOLN
INTRAMUSCULAR | Status: AC
  Filled 2018-06-16: qty 1

## 2018-06-16 MED ORDER — DIBUCAINE 1 % RE OINT: 1.0000 "application " | TOPICAL_OINTMENT | RECTAL | Status: DC | PRN

## 2018-06-16 MED ORDER — SODIUM CHLORIDE 0.9% FLUSH: 3.0000 mL | INTRAVENOUS | Status: DC | PRN

## 2018-06-16 MED ORDER — SODIUM BICARBONATE 8.4 % IV SOLN
INTRAVENOUS | Status: DC | PRN
  Administered 2018-06-16 (×3): 5 mL via EPIDURAL

## 2018-06-16 MED ORDER — OXYTOCIN 40 UNITS IN NORMAL SALINE INFUSION - SIMPLE MED: 2.5000 [IU]/h | INTRAVENOUS | Status: AC

## 2018-06-16 MED ORDER — MENTHOL 3 MG MT LOZG: 1.0000 | LOZENGE | OROMUCOSAL | Status: DC | PRN

## 2018-06-16 MED ORDER — MORPHINE SULFATE (PF) 0.5 MG/ML IJ SOLN
INTRAMUSCULAR | Status: AC
  Filled 2018-06-16: qty 10

## 2018-06-16 MED ORDER — ENOXAPARIN SODIUM 40 MG/0.4ML ~~LOC~~ SOLN: 40.0000 mg | SUBCUTANEOUS | Status: DC

## 2018-06-16 MED ORDER — MEPERIDINE HCL 25 MG/ML IJ SOLN: 6.2500 mg | INTRAMUSCULAR | Status: DC | PRN

## 2018-06-16 SURGICAL SUPPLY — 34 items
BRR ADH 6X5 SEPRAFILM 1 SHT (MISCELLANEOUS)
CHLORAPREP W/TINT 26ML (MISCELLANEOUS) ×2 IMPLANT
CLAMP CORD UMBIL (MISCELLANEOUS) IMPLANT
DRSG OPSITE POSTOP 4X10 (GAUZE/BANDAGES/DRESSINGS) ×2 IMPLANT
ELECT REM PT RETURN 9FT ADLT (ELECTROSURGICAL) ×2
ELECTRODE REM PT RTRN 9FT ADLT (ELECTROSURGICAL) ×1 IMPLANT
EXTRACTOR VACUUM M CUP 4 TUBE (SUCTIONS) IMPLANT
GLOVE BIOGEL PI IND STRL 6.5 (GLOVE) ×1 IMPLANT
GLOVE BIOGEL PI IND STRL 7.0 (GLOVE) ×1 IMPLANT
GLOVE BIOGEL PI INDICATOR 6.5 (GLOVE) ×1
GLOVE BIOGEL PI INDICATOR 7.0 (GLOVE) ×1
GLOVE SURG SS PI 6.0 STRL IVOR (GLOVE) ×2 IMPLANT
GOWN STRL REUS W/TWL LRG LVL3 (GOWN DISPOSABLE) ×4 IMPLANT
HOVERMATT SINGLE USE (MISCELLANEOUS) ×1 IMPLANT
KIT ABG SYR 3ML LUER SLIP (SYRINGE) IMPLANT
NDL HYPO 25X5/8 SAFETYGLIDE (NEEDLE) IMPLANT
NEEDLE HYPO 25X5/8 SAFETYGLIDE (NEEDLE) IMPLANT
NS IRRIG 1000ML POUR BTL (IV SOLUTION) ×2 IMPLANT
PACK C SECTION WH (CUSTOM PROCEDURE TRAY) ×2 IMPLANT
PAD ABD 8X10 STRL (GAUZE/BANDAGES/DRESSINGS) ×1 IMPLANT
PAD OB MATERNITY 4.3X12.25 (PERSONAL CARE ITEMS) ×2 IMPLANT
PENCIL SMOKE EVAC W/HOLSTER (ELECTROSURGICAL) ×2 IMPLANT
RETRACTOR TRAXI PANNICULUS (MISCELLANEOUS) IMPLANT
RTRCTR C-SECT PINK 25CM LRG (MISCELLANEOUS) IMPLANT
SEPRAFILM MEMBRANE 5X6 (MISCELLANEOUS) IMPLANT
SPONGE GAUZE 4X4 12PLY STER LF (GAUZE/BANDAGES/DRESSINGS) ×2 IMPLANT
SUT PLAIN 0 NONE (SUTURE) IMPLANT
SUT PLAIN 2 0 XLH (SUTURE) ×1 IMPLANT
SUT VIC AB 0 CT1 36 (SUTURE) ×8 IMPLANT
SUT VIC AB 4-0 KS 27 (SUTURE) ×2 IMPLANT
TAPE CLOTH SURG 4X10 WHT LF (GAUZE/BANDAGES/DRESSINGS) ×1 IMPLANT
TOWEL OR 17X24 6PK STRL BLUE (TOWEL DISPOSABLE) ×2 IMPLANT
TRAXI PANNICULUS RETRACTOR (MISCELLANEOUS) ×1
TRAY FOLEY W/BAG SLVR 14FR LF (SET/KITS/TRAYS/PACK) ×2 IMPLANT

## 2018-06-16 NOTE — Transfer of Care (Signed)
Immediate Anesthesia Transfer of Care Note  Patient: Alison Sandoval  Procedure(s) Performed: CESAREAN SECTION (N/A )  Patient Location: PACU  Anesthesia Type:Epidural  Level of Consciousness: awake and alert   Airway & Oxygen Therapy: Patient Spontanous Breathing  Post-op Assessment: Report given to RN and Post -op Vital signs reviewed and stable  Post vital signs: Reviewed  Last Vitals:  Vitals Value Taken Time  BP 124/78 06/16/2018  7:01 AM  Temp    Pulse 72 06/16/2018  7:03 AM  Resp 20 06/16/2018  7:03 AM  SpO2 92 % 06/16/2018  7:03 AM  Vitals shown include unvalidated device data.  Last Pain:  Vitals:   06/16/18 0513  TempSrc: Axillary  PainSc:          Complications: No apparent anesthesia complications

## 2018-06-16 NOTE — Progress Notes (Signed)
LABOR PROGRESS NOTE  Hansika Maione is a 23 y.o. G1P0 at [redacted]w[redacted]d admitted for IOL for A1GDM  Subjective: Feeling pain on right side Expresses anxiety about baby and long labor  Objective: BP 137/65   Pulse (!) 139   Temp 99.3 F (37.4 C) (Oral)   Resp 16   LMP 09/07/2017 (Approximate)   SpO2 100%  or  Vitals:   06/16/18 0201 06/16/18 0236 06/16/18 0301 06/16/18 0337  BP: 129/71 134/81 132/78 137/65  Pulse: (!) 130 (!) 138 (!) 127 (!) 139  Resp: 18 18 18 16   Temp:   99.3 F (37.4 C)   TempSrc:   Oral   SpO2:        Dilation: (P) 7 Effacement (%): 60 Cervical Position: Posterior Station: 0 Presentation: Vertex Exam by:: (P) Canary Fister FHT: baseline rate 150s, moderate varibility, + acel, no decel Toco: regular q16m  Labs: Lab Results  Component Value Date   WBC 13.1 (H) 06/15/2018   HGB 11.4 (L) 06/15/2018   HCT 35.4 (L) 06/15/2018   MCV 80.1 06/15/2018   PLT 267 06/15/2018    Patient Active Problem List   Diagnosis Date Noted  . Gestational diabetes 03/30/2018  . Gastroesophageal reflux in pregnancy 03/01/2018  . Rubella non-immune status, antepartum 11/14/2017  . Supervision of high risk pregnancy, antepartum 11/07/2017    Assessment / Plan: 23 y.o. G1P0 at [redacted]w[redacted]d here for IOL for A1GDM  Labor: off pitocin x 1 hour due to fetal indications. Will restart now that baby is doing well  Fetal Wellbeing:  Cat 1 Pain Control:  Dosing epidural to help with right sided pain  Anticipated MOD:  SVD v C/S. Discussed possible section with patient given slow progress and fetal intolerance to position changes. For now, will continue to attempt to get adequate contractions.   Gwenevere Abbot, MD  OB Fellow  06/16/2018, 4:22 AM

## 2018-06-16 NOTE — Anesthesia Postprocedure Evaluation (Signed)
Anesthesia Post Note  Patient: Alison Sandoval  Procedure(s) Performed: CESAREAN SECTION (N/A )     Patient location during evaluation: Mother Baby Anesthesia Type: Epidural Level of consciousness: awake and alert, oriented and patient cooperative Pain management: pain level controlled Vital Signs Assessment: post-procedure vital signs reviewed and stable Respiratory status: spontaneous breathing Cardiovascular status: stable Postop Assessment: no headache, epidural receding, patient able to bend at knees and no signs of nausea or vomiting Anesthetic complications: no Comments: Pain score 2.    Last Vitals:  Vitals:   06/16/18 1010 06/16/18 1130  BP: (!) 96/58 110/69  Pulse: (!) 103 98  Resp: 19 16  Temp: 36.7 C 36.6 C  SpO2: 96% 96%    Last Pain:  Vitals:   06/16/18 1330  TempSrc:   PainSc: Asleep   Pain Goal: Patients Stated Pain Goal: 3 (06/16/18 1130)                 Merrilyn Puma

## 2018-06-16 NOTE — Progress Notes (Signed)
LABOR PROGRESS NOTE  Alison Sandoval is a 23 y.o. G1P0 at [redacted]w[redacted]d  admitted for IOL for A1GDM  Subjective: Feeling more pressure  Objective: BP 129/71   Pulse (!) 130   Temp 99.2 F (37.3 C) (Oral)   Resp 18   LMP 09/07/2017 (Approximate)   SpO2 100%  or  Vitals:   06/16/18 0031 06/16/18 0101 06/16/18 0131 06/16/18 0201  BP: 127/80 133/75 (!) 146/64 129/71  Pulse: (!) 124 (!) 129 (!) 122 (!) 130  Resp: 16 18 18 18   Temp:  99.2 F (37.3 C)    TempSrc:  Oral    SpO2:        Dilation: 7 Effacement (%): 60 Cervical Position: Posterior Station: 0 Presentation: Vertex Exam by:: MD Janina Mayo: baseline rate 130s, moderate varibility, + acel, variable decel Toco: regular q1-21m  Labs: Lab Results  Component Value Date   WBC 13.1 (H) 06/15/2018   HGB 11.4 (L) 06/15/2018   HCT 35.4 (L) 06/15/2018   MCV 80.1 06/15/2018   PLT 267 06/15/2018    Patient Active Problem List   Diagnosis Date Noted  . Gestational diabetes 03/30/2018  . Gastroesophageal reflux in pregnancy 03/01/2018  . Rubella non-immune status, antepartum 11/14/2017  . Supervision of high risk pregnancy, antepartum 11/07/2017    Assessment / Plan: 23 y.o. G1P0 at [redacted]w[redacted]d here for IOL for A1GDM  Labor: progressing slowly. Will continue to titrate pitocin.  Fetal Wellbeing:  Intermittent variable decels; will start amnioinfusion  Pain Control:  Comfortable with epidural Anticipated MOD:  SVD   Gwenevere Abbot, MD  OB Fellow  06/16/2018, 2:54 AM

## 2018-06-16 NOTE — Progress Notes (Signed)
Attempted to place patient in right lateral position. Deceleration after change.  Intolerant to position change.  MD made aware.  Will continue to monitor.

## 2018-06-16 NOTE — Lactation Note (Signed)
This note was copied from a baby's chart. Lactation Consultation Note  Patient Name: Alison Sandoval [id number] Date: 06/16/2018   Confirmed with mother that she wants to formula feed.     Maternal Data Formula Feeding for Exclusion: No  Feeding Feeding Type: Bottle Fed - Formula Nipple Type: Slow - flow  LATCH Score                   Interventions    Lactation Tools Discussed/Used WIC Program: Yes   Consult Status      Hardie Pulley 06/16/2018, 11:26 AM

## 2018-06-16 NOTE — Progress Notes (Signed)
LABOR PROGRESS NOTE  Alison Sandoval is a 23 y.o. G1P0 at [redacted]w[redacted]d  admitted for IOL for GDMA1  Subjective: Patient is comfortable now After taking patient off pitocin, restarted with recurrent late decels Discussed risks and benefits of cesarean section in detail with patient and family They are agreeable to move forward with surgical delivery   Objective: BP 137/65   Pulse (!) 139   Temp 98.5 F (36.9 C) (Axillary)   Resp 16   LMP 09/07/2017 (Approximate)   SpO2 100%  or  Vitals:   06/16/18 0236 06/16/18 0301 06/16/18 0337 06/16/18 0513  BP: 134/81 132/78 137/65   Pulse: (!) 138 (!) 127 (!) 139   Resp: 18 18 16    Temp:  99.3 F (37.4 C)  98.5 F (36.9 C)  TempSrc:  Oral  Axillary  SpO2:        Dilation: 7 Effacement (%): 60 Cervical Position: Posterior Station: 0 Presentation: Vertex Exam by:: Aneta Mins FHT: baseline rate 160, moderate varibility, + acel, late decels Toco: regular q2-21m  Labs: Lab Results  Component Value Date   WBC 13.1 (H) 06/15/2018   HGB 11.4 (L) 06/15/2018   HCT 35.4 (L) 06/15/2018   MCV 80.1 06/15/2018   PLT 267 06/15/2018    Patient Active Problem List   Diagnosis Date Noted  . Gestational diabetes 03/30/2018  . Gastroesophageal reflux in pregnancy 03/01/2018  . Rubella non-immune status, antepartum 11/14/2017  . Supervision of high risk pregnancy, antepartum 11/07/2017    Assessment / Plan: 23 y.o. G1P0 at [redacted]w[redacted]d here for IOL for GDMA1  Labor: unable to obtain adequate contraction pattern and unable to titrate pitocin due to fetal intolerance Fetal Wellbeing:  Cat 2 Pain Control:  Comfortable with epidural  Anticipated MOD:  C/S  Gwenevere Abbot, MD  OB Fellow  06/16/2018, 5:21 AM

## 2018-06-16 NOTE — Progress Notes (Signed)
Attempted to change patient position to right lateral.  Pt intolerant to position change.  Md made aware.  Will continue to monitor.

## 2018-06-16 NOTE — Op Note (Signed)
[facility] PROCEDURE DATE: 06/15/2018 - 06/16/2018  PREOPERATIVE DIAGNOSIS: Intrauterine pregnancy at  [redacted]w[redacted]d weeks gestation; non-reassuring fetal status  POSTOPERATIVE DIAGNOSIS: The same  PROCEDURE:     Cesarean Section  SURGEON:  Dr. Catalina Antigua  ASSISTANT: Dr. Vear Clock  INDICATIONS: Alison Sandoval is a 23 y.o. G1P0 at [redacted]w[redacted]d scheduled for cesarean section secondary to non-reassuring fetal status at 7 cm.  The risks of cesarean section discussed with the patient included but were not limited to: bleeding which may require transfusion or reoperation; infection which may require antibiotics; injury to bowel, bladder, ureters or other surrounding organs; injury to the fetus; need for additional procedures including hysterectomy in the event of a life-threatening hemorrhage; placental abnormalities wth subsequent pregnancies, incisional problems, thromboembolic phenomenon and other postoperative/anesthesia complications. The patient concurred with the proposed plan, giving informed written consent for the procedure.    FINDINGS:  Viable female infant in cephalic presentation.  Apgars 8 and 9.  Thick meconium stained amniotic fluid.  Intact placenta, three vessel cord.  Normal uterus, fallopian tubes and ovaries bilaterally.  ANESTHESIA:    Spinal INTRAVENOUS FLUIDS:1500 ml ESTIMATED BLOOD LOSS: 541 mL ml URINE OUTPUT:  200 ml SPECIMENS: Placenta sent to pathology COMPLICATIONS: None immediate  PROCEDURE IN DETAIL:  The patient received intravenous antibiotics and had sequential compression devices applied to her lower extremities while in the preoperative area.  She was then taken to the operating room where anesthesia was induced and was found to be adequate. A foley catheter was placed into her bladder and attached to Nehemiah Mcfarren gravity. She was then placed in a dorsal supine position with a leftward tilt, and prepped and draped in a sterile manner. After an adequate timeout was performed, a  Pfannenstiel skin incision was made with scalpel and carried through to the underlying layer of fascia. The fascia was incised in the midline and this incision was extended bilaterally using the Mayo scissors. Kocher clamps were applied to the superior aspect of the fascial incision and the underlying rectus muscles were dissected off bluntly. A similar process was carried out on the inferior aspect of the facial incision. The rectus muscles were separated in the midline bluntly and the peritoneum was entered bluntly. The Alexis self-retaining retractor was introduced into the abdominal cavity. Attention was turned to the lower uterine segment where a bladder flap was created, and a transverse hysterotomy was made with a scalpel and extended bilaterally bluntly. The infant was successfully delivered and delayed cord clamping was performed for 1 minute. The cord was clamped and cut and infant was handed over to awaiting neonatology team. Uterine massage was then administered and the placenta delivered intact with three-vessel cord. The uterus was cleared of clot and debris.  The hysterotomy was closed with 0 Vicryl in a running locked fashion, and an imbricating layer was also placed with a 0 Vicryl. Overall, excellent hemostasis was noted. The pelvis copiously irrigated and cleared of all clot and debris. Hemostasis was confirmed on all surfaces.  The peritoneum and the muscles were reapproximated using 0 vicryl interrupted stitches. The fascia was then closed using 0 Vicryl in a running fashion.  The subcutaneous layer was reapproximated with plain gut and the skin was closed in a subcuticular fashion using 3.0 Vicryl. The patient tolerated the procedure well. Sponge, lap, instrument and needle counts were correct x 2. She was taken to the recovery room in stable condition.    Toia Micale ConstantMD  06/16/2018 6:37 AM

## 2018-06-16 NOTE — Progress Notes (Signed)
CBC and Serum Creatinine labs ordered for Lovenox therapy for now (after delivery), and for 06/17/18 in am. Lab double checked with RN to be sure both were needed before sticking patient. Called Dr. Debroah Loop to verify if both were needed. Dr. Debroah Loop ordered to only get the CBC and serum creatinine now and discontinue those for the morning. Notified lab and modified orders.  Earl Gala, Linda Hedges [place]

## 2018-06-16 NOTE — Consult Note (Signed)
Neonatology Note:   Attendance at C-section:    I was asked by Dr. Constant to attend this C/S at term due to recurrent late decels. The mother is a G1, GBS + aIAP with good prenatal care presenting for IOL for GDM. ROM 16 hours before delivery, fluid meconium, thickening. Infant vigorous with good spontaneous cry and tone. +60DCC.  Needed only minimal bulb suctioning. Ap 8/9. Lungs clear to ausc in DR. To CN to care of Pediatrician.  David C. Ehrmann, MD  

## 2018-06-16 NOTE — Progress Notes (Addendum)
Attempted to place pillow under patient.  Patient began to decel.  Intolerant to position change for most of the shift.  Aneta Mins, MD aware.  Will continue to monitor.

## 2018-06-17 LAB — CBC
HCT: 27.3 % — ABNORMAL LOW (ref 36.0–46.0)
Hemoglobin: 8.8 g/dL — ABNORMAL LOW (ref 12.0–15.0)
MCH: 25.9 pg — ABNORMAL LOW (ref 26.0–34.0)
MCHC: 32.2 g/dL (ref 30.0–36.0)
MCV: 80.3 fL (ref 80.0–100.0)
Platelets: 203 10*3/uL (ref 150–400)
RBC: 3.4 MIL/uL — AB (ref 3.87–5.11)
RDW: 16 % — ABNORMAL HIGH (ref 11.5–15.5)
WBC: 14.3 10*3/uL — ABNORMAL HIGH (ref 4.0–10.5)
nRBC: 0 % (ref 0.0–0.2)

## 2018-06-17 LAB — GLUCOSE, CAPILLARY: Glucose-Capillary: 76 mg/dL (ref 70–99)

## 2018-06-17 NOTE — Discharge Summary (Addendum)
OB Discharge Summary     Patient Name: Alison Sandoval DOB: 04/14/1996 MRN: [id number]  Date of admission: 06/15/2018 Delivering MD: Catalina AntiguaONSTANT, PEGGY   Date of discharge: 06/18/2018  Admitting diagnosis: INDUCTION Intrauterine pregnancy: 688w2d     Secondary diagnosis:  Active Problems:   Gestational diabetes  Additional problems: None     Discharge diagnosis: Term Pregnancy Delivered and GDM A1                                                                                                Post partum procedures:None  Augmentation: AROM, Pitocin and Foley Balloon  Complications: None  Hospital course:  Induction of Labor With Cesarean Section  23 y.o. yo G1P1001 at 408w2d was admitted to the hospital 06/15/2018 for induction of labor. Patient had a labor course significant for induction with AROM, cytotec and pitosin. The patient went for cesarean section due to Non-Reassuring FHR, and delivered a Viable infant,06/16/2018  Membrane Rupture Time/Date: 1:05 PM ,06/15/2018   Details of operation can be found in separate operative Note.  Patient had an uncomplicated postpartum course. She is ambulating, tolerating a regular diet, passing flatus, and urinating well.  Patient is discharged home in stable condition on 06/18/18.                                    Physical exam  Vitals:   06/17/18 1411 06/17/18 1945 06/17/18 2124 06/18/18 0540  BP: 103/62 112/71 114/74 106/60  Pulse: 91 83 96 80  Resp: 18 16  18   Temp: 98.2 F (36.8 C) 98.6 F (37 C) 97.9 F (36.6 C) 98.7 F (37.1 C)  TempSrc: Oral Oral Oral Oral  SpO2:   100%    General: alert, cooperative and no distress Lochia: appropriate Uterine Fundus: firm Incision: Healing well with no significant drainage DVT Evaluation: No evidence of DVT seen on physical exam. Labs: Lab Results  Component Value Date   WBC 14.3 (H) 06/17/2018   HGB 8.8 (L) 06/17/2018   HCT 27.3 (L) 06/17/2018   MCV 80.3 06/17/2018   PLT 203  06/17/2018   CMP Latest Ref Rng & Units 06/16/2018  Creatinine 0.44 - 1.00 mg/dL 1.300.70    Discharge instruction: per After Visit Summary and "Baby and Me Booklet".  After visit meds:  Allergies as of 06/18/2018      Reactions   Caramel Rash      Medication List    TAKE these medications   ACCU-CHEK FASTCLIX LANCETS Misc 1 each by Percutaneous route 4 (four) times daily.   acetaminophen 325 MG tablet Commonly known as:  TYLENOL Take 325 mg by mouth as needed for mild pain.   glucose blood test strip Commonly known as:  ACCU-CHEK GUIDE Check blood sugar 4 times daily   ibuprofen 800 MG tablet Commonly known as:  ADVIL,MOTRIN Take 1 tablet (800 mg total) by mouth every 8 (eight) hours.   omeprazole 20 MG capsule Commonly known as:  PRILOSEC Take 1 capsule (20 mg total) by mouth  daily.   oxyCODONE-acetaminophen 5-325 MG tablet Commonly known as:  PERCOCET/ROXICET Take 1 tablet by mouth every 6 (six) hours as needed for up to 3 days for severe pain.   PRENATAL VITAMIN PLUS LOW IRON 27-1 MG Tabs TAKE 1 TABLET BY MOUTH ONCE DAILY AT 12 NOON       Diet: carb modified diet  Activity: Advance as tolerated. Pelvic rest for 6 weeks.   Outpatient follow up:2 weeks Follow up Appt: Future Appointments  Date Time Provider Department Center  06/22/2018 12:45 PM Tilda BurrowFerguson, John V, MD [facility]  07/26/2018  2:30 PM Cresenzo-Dishmon, Scarlette CalicoFrances, CNM [facility]   Follow up Visit:No follow-ups on file.  Postpartum contraception: Undecided  Newborn Data: Live born female  Birth Weight: 7 lb 0.7 oz (3195 g) APGAR: 8, 9  Newborn Delivery   Birth date/time:  06/16/2018 06:04:00 Delivery type:  C-Section, Low Transverse Trial of labor:  No C-section categorization:  Primary     Baby Feeding: Bottle Disposition:home with mother   06/18/2018 Dollene ClevelandHannah C Jeron Grahn, DO

## 2018-06-17 NOTE — Plan of Care (Signed)
  Problem: Activity: Goal: Risk for activity intolerance will decrease Note:  Discussed with patient the importance of ambulating in hallway at least four times a day. Patient has been ambulating in the room frequently. Patient plans to nap after her pain medication and then ambulate in hallway. Earl Gala, Linda Hedges [place]

## 2018-06-17 NOTE — Progress Notes (Signed)
Subjective: Postpartum Day 1: Cesarean Delivery Patient reports being tired and sore. Passing gas. Has gotten up to go to bathroom a few times. Pain relatively well-controlled   Objective: Vital signs in last 24 hours: Temp:  [97.2 F (36.2 C)-98 F (36.7 C)] 97.7 F (36.5 C) (01/26 0512) Pulse Rate:  [84-103] 86 (01/26 0512) Resp:  [16-19] 16 (01/26 0512) BP: (96-113)/(57-69) 110/57 (01/26 0512) SpO2:  [96 %-100 %] 100 % (01/26 0512)  Physical Exam:  General: alert, well-appearing, NAD Lochia: appropriate Uterine Fundus: firm Incision: pressure dressing in place, no leakage noted DVT Evaluation: No significant calf/ankle edema.  Recent Labs    06/15/18 0738 06/16/18 0901  HGB 11.4* 10.1*  HCT 35.4* 31.6*    Assessment/Plan: Status post Cesarean section. Doing well postoperatively.  Continue current care. HgB to be repeated this morning Needs MMR (Rubella non-immune)   Glenice Bow 06/17/2018, 9:11 AM

## 2018-06-17 NOTE — Progress Notes (Signed)
Honeycomb dressing on patient was coming off on left side and had small amount of dried blood. Order obtained from Dr. Vergie Living to change dressing. Old honeycomb dressing removed. Incision site is clean, no drainage, dry, and edges are approximated. New honeycomb dressing applied in sterile fashion. Patrica Duel, RN assisted.   Venida Jarvis, RN

## 2018-06-18 LAB — GLUCOSE, CAPILLARY: Glucose-Capillary: 135 mg/dL — ABNORMAL HIGH (ref 70–99)

## 2018-06-18 MED ORDER — IBUPROFEN 800 MG PO TABS: 800.0000 mg | ORAL_TABLET | Freq: Three times a day (TID) | ORAL | 0 refills | Status: DC

## 2018-06-18 MED ORDER — OXYCODONE-ACETAMINOPHEN 5-325 MG PO TABS: 2.0000 | ORAL_TABLET | ORAL | 0 refills | Status: DC | PRN

## 2018-06-18 MED ORDER — OXYCODONE-ACETAMINOPHEN 5-325 MG PO TABS: 1.0000 | ORAL_TABLET | Freq: Four times a day (QID) | ORAL | 0 refills | Status: AC | PRN

## 2018-06-18 NOTE — Progress Notes (Signed)
CSW received consult due to score 11 on Edinburgh Depression Screen.    CSW met with MOB at bedside to discuss edinburgh score 11. MOB was welcoming and engaged with CSW during assessment. CSW and MOB discussed edinburgh score 11 and MOB's mental health history. MOB reported that she does not have any formal mental health diagnoses but believes she does have anxiety. CSW inquired about MOB's symptoms. MOB reported that she just gets overwhelmed by small things and sometimes has panic attacks. MOB reported that her most recent panic attack was 2 weeks ago. CSW asked MOB about her panic attack symptoms, MOB reported that she sometimes gets hot/sweaty, feels like she cant breathe and sometimes she doesn't want to talk to people. MOB reported that she has been this way a majority of her life. CSW asked MOB if she had considered therapy, MOB reported yes. CSW agreed to provide MOB with some therapy resources near her home. CSW encouraged MOB to follow up with a therapist if needed to cope with her symptoms. CSW inquired about MOB's current coping skills. MOB reported that she will divert her attention to other things or go to sleep, noting these things help her. CSW inquired about MOB's support system, MOB reported that her mom, sisters, dad and grandma are all supports for her. CSW encouraged MOB utilize her supports for assistance with infant and to also be there for MOB. MOB presented calm and was engaged throughout assessment. MOB did not demonstrate any acute mental health signs/symptoms. CSW assessed for safety, MOB denied SI, HI and domestic violence.   CSW provided education regarding Baby Blues vs PMADs and provided MOB with resources for mental health follow up.  CSW encouraged MOB to evaluate her mental health throughout the postpartum period with the use of the New Mom Checklist developed by Postpartum [employer] as well as the [place] Postnatal Depression Scale and notify a medical professional if  symptoms arise.   Abundio Miu, [place] Worker [facility] Cell#: [phone]

## 2018-06-18 NOTE — Progress Notes (Deleted)
Subjective: Postpartum Day 2: s/p primary Cesarean Delivery for NRFHT Patient reports tolerating PO, no problems voiding or ambulating. Reports that she finally slept last night and feels great.   Objective: Vital signs in last 24 hours: Temp:  [97.9 F (36.6 C)-98.7 F (37.1 C)] 98.7 F (37.1 C) (01/27 0540) Pulse Rate:  [80-96] 80 (01/27 0540) Resp:  [16-18] 18 (01/27 0540) BP: (103-114)/(60-74) 106/60 (01/27 0540) SpO2:  [100 %] 100 % (01/26 2124)  Physical Exam:  General: alert Lochia: appropriate Uterine Fundus: firm Incision: healing well DVT Evaluation: No evidence of DVT seen on physical exam.  Recent Labs    06/16/18 0901 06/17/18 0933  HGB 10.1* 8.8*  HCT 31.6* 27.3*    Assessment/Plan: POD 2 s/p primary csection  Recovering as expected Bottlefeeding  Anticipate discharge tomorrow or Alison Sandoval 06/18/2018, 6:51 AM

## 2018-06-18 NOTE — Discharge Instructions (Signed)

## 2018-06-19 ENCOUNTER — Encounter (HOSPITAL_COMMUNITY): Payer: Self-pay

## 2018-06-19 MED ORDER — SENNOSIDES-DOCUSATE SODIUM 8.6-50 MG PO TABS: 2.0000 | ORAL_TABLET | Freq: Every evening | ORAL | 0 refills | Status: DC | PRN

## 2018-06-19 NOTE — Discharge Summary (Addendum)
Obstetrics Discharge Summary OB/[facility]N [facility]   Patient Name: Samyah Bilbo DOB: [birth year] MRN: [id number]  Date of admission: 06/15/2018 Delivering MD: Mora Bellman   Date of discharge: 06/19/2018  Admitting diagnosis: INDUCTION Intrauterine pregnancy: [redacted]w[redacted]d    Secondary diagnosis:   Active Problems:   Gestational diabetes  Outpatient Follow-Up: '[ ]'$  continue to counsel on method of contraception, undecided at discharge '[ ]'$  2-hr GTT at postpartum visit  '[ ]'$  ensure MMR administered (rubella non-immune)   Discharge diagnosis: Term Pregnancy Delivered and GDM A1                Post partum procedures:None Augmentation: AROM, Pitocin and Foley Balloon Complications: None  Hospital course:  Induction of Labor With Cesarean Section  23y.o. yo G1P1001 at 462w2das admitted to the hospital 06/15/2018 for induction of labor. Patient had a labor course significant for induction with AROM, cytotec and pitosin. The patient went for cesarean section due to Non-Reassuring FHR, and delivered a Viable infant,06/16/2018  Membrane Rupture Time/Date: 1:05 PM ,06/15/2018  Details of operation can be found in separate operative note. HgB trended from 11.5 to 10.4.  Patient had an uncomplicated postpartum course. She is ambulating, tolerating a regular diet, passing flatus, and urinating well.  Patient is discharged home in stable condition on 06/19/18.    Physical exam  Vitals:   06/18/18 0540 06/18/18 1428 06/18/18 2221 06/19/18 0543  BP: 106/60 110/74 105/64 121/67  Pulse: 80 (!) 103 80 90  Resp: '18 16 18 18  '$ Temp: 98.7 F (37.1 C) 98.4 F (36.9 C) (!) 97.5 F (36.4 C) 98.2 F (36.8 C)  TempSrc: Oral Oral Oral Oral  SpO2:   100% 100%   General: well-appearing, NAD Lochia: appropriate Uterine Fundus: firm Incision: Dressing is clean, dry, and intact DVT Evaluation: No significant calf/ankle edema. Labs: Lab Results  Component Value Date   WBC 14.3 (H) 06/17/2018   HGB 8.8 (L)  06/17/2018   HCT 27.3 (L) 06/17/2018   MCV 80.3 06/17/2018   PLT 203 06/17/2018   CMP Latest Ref Rng & Units 06/16/2018  Creatinine 0.44 - 1.00 mg/dL 0.70    Discharge instructions: Per After Visit Summary and "Baby and Me Booklet"  After visit meds:  Allergies as of 06/19/2018      Reactions   Caramel Rash      Medication List    TAKE these medications   ACCU-CHEK FASTCLIX LANCETS Misc 1 each by Percutaneous route 4 (four) times daily.   acetaminophen 325 MG tablet Commonly known as:  TYLENOL Take 325 mg by mouth as needed for mild pain.   glucose blood test strip Commonly known as:  ACCU-CHEK GUIDE Check blood sugar 4 times daily   ibuprofen 800 MG tablet Commonly known as:  ADVIL,MOTRIN Take 1 tablet (800 mg total) by mouth every 8 (eight) hours.   omeprazole 20 MG capsule Commonly known as:  PRILOSEC Take 1 capsule (20 mg total) by mouth daily.   oxyCODONE-acetaminophen 5-325 MG tablet Commonly known as:  PERCOCET/ROXICET Take 1 tablet by mouth every 6 (six) hours as needed for up to 3 days for severe pain.   PRENATAL VITAMIN PLUS LOW IRON 27-1 MG Tabs TAKE 1 TABLET BY MOUTH ONCE DAILY AT 12 NOON      Postpartum contraception: Undecided Diet: Routine Diet Activity: Advance as tolerated. Pelvic rest for 6 weeks.   Follow-up Appt: Future Appointments  Date Time Provider [place]1/31/2020 12:45 PM FeJonnie Kind  MD [facility]  07/26/2018  2:30 PM Cresenzo-Dishmon, Joaquim Lai, CNM [facility]   Newborn Data: Live born female  Birth Weight: 7 lb 0.7 oz (3195 g) APGAR: 8, 9  Newborn Delivery   Birth date/time:  06/16/2018 06:04:00 Delivery type:  C-Section, Low Transverse Trial of labor:  No C-section categorization:  Primary    Baby Feeding: Bottle Disposition:home with mother  Lambert Mody. Juleen China, DO OB/GYN Fellow, Faculty Practice

## 2018-06-22 ENCOUNTER — Encounter: Payer: Self-pay | Admitting: Obstetrics and Gynecology

## 2018-06-22 ENCOUNTER — Ambulatory Visit (INDEPENDENT_AMBULATORY_CARE_PROVIDER_SITE_OTHER): Payer: Medicaid Other | Admitting: Obstetrics and Gynecology

## 2018-06-22 VITALS — BP 137/86 | HR 67 | Ht 64.0 in | Wt 245.4 lb

## 2018-06-22 DIAGNOSIS — Z9889 Other specified postprocedural states: Secondary | ICD-10-CM | POA: Diagnosis not present

## 2018-06-22 DIAGNOSIS — Z09 Encounter for follow-up examination after completed treatment for conditions other than malignant neoplasm: Secondary | ICD-10-CM

## 2018-06-22 NOTE — Progress Notes (Signed)
Patient ID: Io Donnel, female   DOB: July 22, 1995, 23 y.o.   MRN: [id number]  Subjective:  Alison Sandoval is a 23 y.o. female now 1 weeks status post c-section.  Baby's heart rate dropped so c-section was done. Is hardly taking percocet mainly uses ibuprofen.   Review of Systems Negative except none   Diet:   normal   Bowel movements : normal.  Pain is controlled with current analgesics. Medications being used: ibuprofen (OTC) and prescription NSAID's including percocet.  Objective:  There were no vitals taken for this visit. General:Well developed, well nourished.  No acute distress. Abdomen: Bowel sounds normal, soft, non-tender. Pelvic Exam: Not done  Incision(s): Healing well, no drainage, no erythema, no hernia, no swelling, no dehiscence.  Assessment:  Post-Op 1 weeks s/p C-section   Doing well postoperatively.   Plan:  1.Wound care discussed, use cloth if incision feels wet 2. . current medications, Ibuprofen 3. Activity restrictions: no driving 4. return to work: not applicable. 5. Follow up in 4 weeks.  By signing my name below, I, Arnette Norris, attest that this documentation has been prepared under the direction and in the presence of Tilda Burrow, MD. Electronically Signed: Arnette Norris Medical Scribe. 06/22/18. 1:39 PM.  I personally performed the services described in this documentation, which was SCRIBED in my presence. The recorded information has been reviewed and considered accurate. It has been edited as necessary during review. Tilda Burrow, MD

## 2018-07-26 ENCOUNTER — Ambulatory Visit: Payer: Medicaid Other | Admitting: Obstetrics & Gynecology

## 2018-07-26 ENCOUNTER — Ambulatory Visit: Payer: Self-pay | Admitting: Advanced Practice Midwife

## 2018-08-02 ENCOUNTER — Other Ambulatory Visit: Payer: Self-pay

## 2018-08-02 ENCOUNTER — Encounter: Payer: Self-pay | Admitting: Advanced Practice Midwife

## 2018-08-02 ENCOUNTER — Ambulatory Visit (INDEPENDENT_AMBULATORY_CARE_PROVIDER_SITE_OTHER): Payer: Medicaid Other | Admitting: Advanced Practice Midwife

## 2018-08-02 DIAGNOSIS — Z1389 Encounter for screening for other disorder: Secondary | ICD-10-CM

## 2018-08-02 NOTE — Progress Notes (Signed)
Alison Sandoval is a 23 y.o. who presents for a postpartum visit. She is 7 weeks postpartum following a low cervical transverse Cesarean section. Got to 7cms, then had NRFHT. I have fully reviewed the prenatal and intrapartum course. The delivery was at 40.2 gestational weeks.  Anesthesia: epidural. Postpartum course has been unevnetufl. Baby's course has been uneventful. Baby is feeding by bottle. Bleeding: had period 3/3. Marland Kitchen Bowel function is normal. Bladder function is normal. Patient was sexually active prior to period. Contraception method is none. Postpartum depression screening: negative.   Current Outpatient Medications:  .  Prenatal Vit-Fe Fumarate-FA (PRENATAL VITAMIN PLUS LOW IRON) 27-1 MG TABS, TAKE 1 TABLET BY MOUTH ONCE DAILY AT 12 NOON, Disp: , Rfl: 12 .  acetaminophen (TYLENOL) 325 MG tablet, Take 325 mg by mouth as needed for mild pain. , Disp: , Rfl:  .  ibuprofen (ADVIL,MOTRIN) 800 MG tablet, Take 1 tablet (800 mg total) by mouth every 8 (eight) hours. (Patient not taking: Reported on 08/02/2018), Disp: 30 tablet, Rfl: 0 .  omeprazole (PRILOSEC) 20 MG capsule, Take 1 capsule (20 mg total) by mouth daily. (Patient not taking: Reported on 08/02/2018), Disp: 30 capsule, Rfl: 3 .  oxyCODONE-acetaminophen (PERCOCET/ROXICET) 5-325 MG tablet, Take 1 tablet by mouth every 6 (six) hours as needed for severe pain., Disp: , Rfl:   Review of Systems   Constitutional: Negative for fever and chills Eyes: Negative for visual disturbances Respiratory: Negative for shortness of breath, dyspnea Cardiovascular: Negative for chest pain or palpitations  Gastrointestinal: Negative for vomiting, diarrhea and constipation Genitourinary: Negative for dysuria and urgency Musculoskeletal: Negative for back pain, joint pain, myalgias  Neurological: Negative for dizziness and headaches    Objective:     Vitals:   08/02/18 1117  BP: 129/85  Pulse: 79   General:  alert, cooperative and no distress   Breasts:  negative  Lungs: Normal respiratory effort  Heart:  regular rate and rhythm  Abdomen: Soft, nontender, well healed   Vulva:  normal  Vagina: normal vagina  Cervix:  closed  Corpus: Well involuted     Rectal Exam: no hemorrhoids        Assessment:    normal postpartum exam.  Plan:   1. Contraception: IUD 2. Follow up in:  asap for IUD. No sex until then  or as needed.

## 2018-08-08 ENCOUNTER — Ambulatory Visit (INDEPENDENT_AMBULATORY_CARE_PROVIDER_SITE_OTHER): Payer: Medicaid Other | Admitting: Advanced Practice Midwife

## 2018-08-08 ENCOUNTER — Other Ambulatory Visit: Payer: Self-pay

## 2018-08-08 ENCOUNTER — Encounter: Payer: Self-pay | Admitting: Advanced Practice Midwife

## 2018-08-08 VITALS — BP 127/77 | HR 98 | Ht 64.0 in | Wt 227.0 lb

## 2018-08-08 DIAGNOSIS — Z3202 Encounter for pregnancy test, result negative: Secondary | ICD-10-CM | POA: Diagnosis not present

## 2018-08-08 DIAGNOSIS — Z30430 Encounter for insertion of intrauterine contraceptive device: Secondary | ICD-10-CM

## 2018-08-08 LAB — POCT URINE PREGNANCY: Preg Test, Ur: NEGATIVE

## 2018-08-08 MED ORDER — LEVONORGESTREL 19.5 MCG/DAY IU IUD
INTRAUTERINE_SYSTEM | Freq: Once | INTRAUTERINE | Status: AC
  Administered 2018-08-08: 17:00:00 via INTRAUTERINE

## 2018-08-08 NOTE — Patient Instructions (Addendum)
IUD PLACEMENT POST-PROCEDURE INSTRUCTIONS  1. You may take Ibuprofen, Aleve or Tylenol for pain if needed.  Cramping should resolve within in 24 hours.  2. You may have a small amount of spotting.  You should wear a mini pad for the next few days  3.    Nothing in the vagina for 3 days (tampons or intercourse)  4.  You need to call if you have a fever (more than 100.4), any moderate to severe pelvic pain, fever, or foul smelling vaginal discharge.  Irregular bleeding is common the first several months after having an IUD placed. You do not need to call for this reason unless you are concerned.  5. Shower or bathe as normal  6.  You should have a follow-up appointment in 4-8 weeks for a re-check to make sure you are not having any problems.  

## 2018-08-08 NOTE — Progress Notes (Signed)
Alison Sandoval is a 23 y.o. year old  female   who presents for placement of a Liletta IUD. Her LMP was 07/24/18 and her pregnancy test today is negative.    The risks and benefits of the method and placement have been thouroughly reviewed with the patient and all questions were answered.  Specifically the patient is aware of failure rate of 05/998, expulsion of the IUD and of possible perforation.  The patient is aware of irregular bleeding due to the method and understands the incidence of irregular bleeding diminishes with time.  Time out was performed.  A Graves speculum was placed.  The cervix was prepped using Betadine. The uterus was found to be neutral and it sounded to 8 cm.  The cervix was grasped with a tenaculum and the IUD was inserted to 8 cm.  It was pulled back 1 cm and the IUD was disengaged.  The strings were trimmed to 3 cm.  Sonogram was performed and the proper placement of the IUD was verified.  The patient was instructed on signs and symptoms of infection and to check for the strings after each menses or each month.  The patient is to refrain from intercourse for 3 days.  The patient is scheduled for a return appointment after her first menses or 4 weeks.  [name] 08/08/2018 3:20 PM

## 2018-08-08 NOTE — Addendum Note (Signed)
Addended by: Colen Darling on: 08/08/2018 04:48 PM   Modules accepted: Orders

## 2018-08-29 ENCOUNTER — Telehealth: Payer: Self-pay | Admitting: Advanced Practice Midwife

## 2018-08-29 NOTE — Telephone Encounter (Signed)
Wants to get referral to behavioral health.

## 2018-08-29 NOTE — Telephone Encounter (Signed)
Spoke with pt. Pt feels anxious, down, and not wanting to get out of bed. Wants referral. Has felt suicidal in the past, but not now. Please advise. Thanks!! JSY

## 2018-09-03 ENCOUNTER — Encounter: Payer: Self-pay | Admitting: *Deleted

## 2018-09-05 ENCOUNTER — Other Ambulatory Visit: Payer: Self-pay

## 2018-09-05 ENCOUNTER — Encounter: Payer: Self-pay | Admitting: Women's Health

## 2018-09-05 ENCOUNTER — Ambulatory Visit (INDEPENDENT_AMBULATORY_CARE_PROVIDER_SITE_OTHER): Payer: Medicaid Other | Admitting: Women's Health

## 2018-09-05 DIAGNOSIS — Z30431 Encounter for routine checking of intrauterine contraceptive device: Secondary | ICD-10-CM

## 2018-09-05 DIAGNOSIS — F53 Postpartum depression: Secondary | ICD-10-CM | POA: Insufficient documentation

## 2018-09-05 DIAGNOSIS — O99345 Other mental disorders complicating the puerperium: Secondary | ICD-10-CM

## 2018-09-05 MED ORDER — CITALOPRAM HYDROBROMIDE 20 MG PO TABS: 20.0000 mg | ORAL_TABLET | Freq: Every day | ORAL | 3 refills | Status: DC

## 2018-09-05 NOTE — Progress Notes (Signed)
TELEHEALTH VIRTUAL GYN VISIT ENCOUNTER NOTE Patient name: Alison Sandoval MRN [id number]  Date of birth: [birth year]  I connected with@ on 09/05/18 at 10:30 AM EDT [facility] and verified that I am speaking with the correct person using two identifiers.  Due to COVID-19 recommendations, pt is not currently in the office.    I discussed the limitations, risks, security and privacy concerns of performing an evaluation and management service by telephone and the availability of in person appointments. I also discussed with the patient that there may be a patient responsible charge related to this service. The patient expressed understanding and agreed to proceed.   Chief Complaint:   IUD checkup (not having any issues with IUD; discuss postpartum depression )  History of Present Illness:   Alison Mangesiana Busler is a 23 y.o. [id number] African American female being evaluated today for IUD f/u.  She had a Liletta IUD inserted 08/08/18. No problems, able to feel strings. She is however, having problems w/ PPD. Feels anxious mostly, but does occ have thoughts of harming herself, no plan, states she would not act on thoughts. Never been dx w/ dep/anx, never on any meds. Her family members have had same thing happen after babies. Has good family support. Wants to try medication.  EPDS: 23  Patient's last menstrual period was 07/24/2018. The current method of family planning is IUD. Last pap Jan 2019. Results were:  normal Review of Systems:   Pertinent items are noted in HPI Denies fever/chills, dizziness, headaches, visual disturbances, fatigue, shortness of breath, chest pain, abdominal pain, vomiting, abnormal vaginal discharge/itching/odor/irritation, problems with periods, bowel movements, urination, or intercourse unless otherwise stated above.  Pertinent History Reviewed:  Reviewed past medical,surgical, social, obstetrical and family history.  Reviewed problem list, medications and allergies. Physical  Assessment:  There were no vitals filed for this visit.There is no height or weight on file to calculate BMI.       Physical Examination:   General:  Alert, oriented and cooperative.   Mental Status: Normal mood and affect perceived. Normal judgment and thought content.  Physical exam deferred due to nature of the encounter  No results found for this or any previous visit (from the past 24 hour(s)).  Assessment & Plan:  1) IUD check> doing well  2) PPD/anxiety> w/ occ thoughts of harming self, would never act on thoughts, no plan. Rx celexa. Understands can take a few weeks to notice improvements. Promises to call us or go straight to ED if becomes suicidal. F/U 4wks.   Meds:  Meds ordered this encounter  Medications  . citalopram (CELEXA) 20 MG tablet    Sig: Take 1 tablet (20 mg total) by mouth daily. Take 1/2 tablet (10mg ) daily x 1 week, then 1 tablet (20mg ) daily thereafter    Dispense:  30 tablet    Refill:  3    Order Specific Question:   Supervising Provider    Answer:   Duane LopeEURE, LUTHER H [[id number]]    No orders of the defined types were placed in this encounter.   I discussed the assessment and treatment plan with the patient. The patient was provided an opportunity to ask questions and all were answered. The patient agreed with the plan and demonstrated an understanding of the instructions.   The patient was advised to call back or seek an in-person evaluation/go to the ED if the symptoms worsen or if the condition fails to improve as anticipated.  I provided 14 minutes of non-face-to-face time  during this encounter.   Return in about 4 weeks (around 10/03/2018) for F/U Webex.  Cheral Marker CNM, [facility] 09/05/2018 12:12 PM

## 2018-10-02 ENCOUNTER — Ambulatory Visit: Payer: Medicaid Other | Admitting: Women's Health

## 2019-01-13 ENCOUNTER — Telehealth: Payer: Medicaid Other

## 2020-04-13 DIAGNOSIS — J069 Acute upper respiratory infection, unspecified: Secondary | ICD-10-CM | POA: Diagnosis not present

## 2020-04-13 DIAGNOSIS — U071 COVID-19: Secondary | ICD-10-CM | POA: Diagnosis not present

## 2020-04-14 ENCOUNTER — Telehealth: Payer: Self-pay

## 2020-04-14 NOTE — Telephone Encounter (Signed)
Transition Care Management Follow-up Telephone Call  Date of discharge and from where: 04/13/2020 [facility]  How have you been since you were released from the hospital? Feeling okay, throat is sore unable to taste and smell.   Any questions or concerns? Yes  Items Reviewed:  Did the pt receive and understand the discharge instructions provided? Yes   Medications obtained and verified? No   Other? No   Any new allergies since your discharge? No   Dietary orders reviewed? Yes  Do you have support at home? Yes   Home Care and Equipment/Supplies: Were home health services ordered? not applicable If so, what is the name of the agency? na  Has the agency set up a time to come to the patient's home? not applicable Were any new equipment or medical supplies ordered?  No What is the name of the medical supply agency? na Were you able to get the supplies/equipment? not applicable Do you have any questions related to the use of the equipment or supplies? No  Functional Questionnaire: (I = Independent and D = Dependent) ADLs: I  Bathing/Dressing- I  Meal Prep- I  Eating- I  Maintaining continence- I  Transferring/Ambulation- I  Managing Meds- I  Follow up appointments reviewed:   PCP Hospital f/u appt confirmed? [facility] to see [facility]/24/2020 at 10am. PCP appointment will be scheduled at the end of that visit.   Specialist Hospital f/u appt confirmed? No    Are transportation arrangements needed? No   If their condition worsens, is the pt aware to call PCP or go to the Emergency Dept.? Yes  Was the patient provided with contact information for the PCP's office or ED? Yes  Was to pt encouraged to call back with questions or concerns? Yes  Patient was also given phone number to inquire about the COVID Monoclonal Antibody.

## 2020-04-24 ENCOUNTER — Ambulatory Visit: Payer: Medicaid Other

## 2020-08-12 DIAGNOSIS — J069 Acute upper respiratory infection, unspecified: Secondary | ICD-10-CM | POA: Diagnosis not present

## 2020-08-12 DIAGNOSIS — R059 Cough, unspecified: Secondary | ICD-10-CM | POA: Diagnosis not present

## 2020-08-12 DIAGNOSIS — Z20822 Contact with and (suspected) exposure to covid-19: Secondary | ICD-10-CM | POA: Diagnosis not present

## 2020-08-13 ENCOUNTER — Telehealth: Payer: Self-pay

## 2020-08-13 NOTE — Telephone Encounter (Signed)
Transition Care Management Follow-up Telephone Call  Date of discharge and from where: 08/12/2020 from [facility]  How have you been since you were released from the hospital? Pt stated that she is still coughing and congested but she is experiencing more energy.   Any questions or concerns? No  Items Reviewed:  Did the pt receive and understand the discharge instructions provided? Yes   Medications obtained and verified? Yes   Other? No   Any new allergies since your discharge? No   Dietary orders reviewed? N/A  Do you have support at home? Yes   Functional Questionnaire: (I = Independent and D = Dependent) ADLs: I Bathing/Dressing- I Meal Prep- I Eating- I Maintaining continence- I Transferring/Ambulation- I Managing Meds- I   Follow up appointments reviewed:   PCP Hospital f/u appt confirmed? No    Specialist Hospital f/u appt confirmed? No .  Are transportation arrangements needed? No   If their condition worsens, is the pt aware to call PCP or go to the Emergency Dept.? Yes  Was the patient provided with contact information for the PCP's office or ED? Yes  Was to pt encouraged to call back with questions or concerns? Yes

## 2021-05-07 ENCOUNTER — Telehealth: Payer: Self-pay

## 2021-05-07 NOTE — Telephone Encounter (Signed)
Transition Care Management Unsuccessful Follow-up Telephone Call  Date of discharge and from where:  05/06/2021-[facility]   Attempts:  1st Attempt  Reason for unsuccessful TCM follow-up call:  Unable to leave message

## 2021-05-11 NOTE — Telephone Encounter (Signed)
Transition Care Management Unsuccessful Follow-up Telephone Call  Date of discharge and from where:  05/06/2021 from [facility]  Attempts:  2nd Attempt  Reason for unsuccessful TCM follow-up call:  Unable to leave message

## 2021-05-12 NOTE — Telephone Encounter (Signed)
Transition Care Management Unsuccessful Follow-up Telephone Call  Date of discharge and from where:  05/06/2021 from [facility]  Attempts:  3rd Attempt  Reason for unsuccessful TCM follow-up call:  Unable to reach patient

## 2021-05-18 ENCOUNTER — Telehealth: Payer: Self-pay

## 2021-05-18 NOTE — Telephone Encounter (Signed)
Transition Care Management Unsuccessful Follow-up Telephone Call  Date of discharge and from where:  05/15/2021 from [facility]  Attempts:  1st Attempt  Reason for unsuccessful TCM follow-up call:  Unable to leave message

## 2021-05-19 NOTE — Telephone Encounter (Signed)
Transition Care Management Follow-up Telephone Call Date of discharge and from where: 05/15/2021 from [facility]  How have you been since you were released from the hospital? Pt stated that she is feeling better.  Any questions or concerns? No  Items Reviewed: Did the pt receive and understand the discharge instructions provided? Yes  Medications obtained and verified? Yes  Other? No  Any new allergies since your discharge? No  Dietary orders reviewed? No Do you have support at home? Yes   Functional Questionnaire: (I = Independent and D = Dependent) ADLs: I  Bathing/Dressing- I  Meal Prep- I  Eating- I  Maintaining continence- I  Transferring/Ambulation- I  Managing Meds- I   Follow up appointments reviewed:  PCP Hospital f/u appt confirmed? No   Specialist Hospital f/u appt confirmed? No   Are transportation arrangements needed? No  If their condition worsens, is the pt aware to call PCP or go to the Emergency Dept.? Yes Was the patient provided with contact information for the PCP's office or ED? Yes Was to pt encouraged to call back with questions or concerns? Yes

## 2021-06-07 ENCOUNTER — Telehealth: Payer: Self-pay

## 2021-06-07 NOTE — Telephone Encounter (Signed)
Transition Care Management Unsuccessful Follow-up Telephone Call  Date of discharge and from where:  06/06/2021 from [facility]  Attempts:  1st Attempt  Reason for unsuccessful TCM follow-up call:  Unable to leave message

## 2021-06-09 NOTE — Telephone Encounter (Signed)
Transition Care Management Unsuccessful Follow-up Telephone Call  Date of discharge and from where:  06/06/2021 from [facility]  Attempts:  2nd Attempt  Reason for unsuccessful TCM follow-up call:  Unable to leave message

## 2021-06-10 NOTE — Telephone Encounter (Signed)
Transition Care Management Unsuccessful Follow-up Telephone Call  Date of discharge and from where:  06/06/2021 from [facility]  Attempts:  3rd Attempt  Reason for unsuccessful TCM follow-up call:  Unable to reach patient

## 2021-10-08 DIAGNOSIS — Z3202 Encounter for pregnancy test, result negative: Secondary | ICD-10-CM | POA: Diagnosis not present

## 2021-10-08 DIAGNOSIS — Z733 Stress, not elsewhere classified: Secondary | ICD-10-CM | POA: Diagnosis not present

## 2021-10-08 DIAGNOSIS — G8929 Other chronic pain: Secondary | ICD-10-CM | POA: Diagnosis not present

## 2021-10-08 DIAGNOSIS — S8011XA Contusion of right lower leg, initial encounter: Secondary | ICD-10-CM | POA: Diagnosis not present

## 2021-10-08 DIAGNOSIS — R5383 Other fatigue: Secondary | ICD-10-CM | POA: Diagnosis not present

## 2021-10-08 DIAGNOSIS — S8010XA Contusion of unspecified lower leg, initial encounter: Secondary | ICD-10-CM | POA: Diagnosis not present

## 2021-11-15 ENCOUNTER — Ambulatory Visit: Payer: Medicaid Other | Admitting: Obstetrics & Gynecology

## 2021-12-02 ENCOUNTER — Ambulatory Visit: Payer: Medicaid Other | Admitting: Obstetrics & Gynecology

## 2022-01-05 ENCOUNTER — Emergency Department (HOSPITAL_COMMUNITY): Payer: Medicaid Other

## 2022-01-05 ENCOUNTER — Other Ambulatory Visit: Payer: Self-pay

## 2022-01-05 ENCOUNTER — Emergency Department (HOSPITAL_COMMUNITY)
Admission: EM | Admit: 2022-01-05 | Discharge: 2022-01-06 | Disposition: A | Discharge status: Home / Self Care | Payer: Medicaid Other | Attending: Emergency Medicine | Admitting: Emergency Medicine

## 2022-01-05 ENCOUNTER — Encounter (HOSPITAL_COMMUNITY): Payer: Self-pay

## 2022-01-05 DIAGNOSIS — T189XXA Foreign body of alimentary tract, part unspecified, initial encounter: Secondary | ICD-10-CM | POA: Insufficient documentation

## 2022-01-05 DIAGNOSIS — X58XXXA Exposure to other specified factors, initial encounter: Secondary | ICD-10-CM | POA: Insufficient documentation

## 2022-01-05 NOTE — ED Triage Notes (Signed)
Pt reports chipped tooth to right lower and she swallowed part of the tooth @2200  and now having right sided throat pain.

## 2022-01-05 NOTE — ED Notes (Signed)
Patient transported to X-ray 

## 2022-01-05 NOTE — ED Provider Triage Note (Signed)
Emergency Medicine Provider Triage Evaluation Note  Alison Sandoval , a 26 y.o. female  was evaluated in triage.  Pt complains of swallowed foreign body.  Patient states that she was drinking water around 10-10 30 tonight when a fragment of her to use went down her throat.  She noticed coughing after the event and feels like she may have coughed something up.  She has residual pain on the right side of her "throat."  She reports similar incident happening this past January.  She denies coughing fits since the event.  Denies hemoptysis or hematemesis.  Review of Systems  Positive: See above Negative:   Physical Exam  BP (!) 147/78   Pulse (!) 106   Temp 98 F (36.7 C)   Resp 20   Ht 5\' 4"  (1.626 m)   Wt 90.7 kg   SpO2 100%   BMI 34.33 kg/m  Gen:   Awake, no distress   Resp:  Normal effort  MSK:   Moves extremities without difficulty  Other:    Medical Decision Making  Medically screening exam initiated at 11:54 PM.  Appropriate orders placed.  Alison Sandoval was informed that the remainder of the evaluation will be completed by another provider, this initial triage assessment does not replace that evaluation, and the importance of remaining in the ED until their evaluation is complete.     Claude Manges, Peter Garter 01/05/22 2356

## 2022-01-06 NOTE — Discharge Instructions (Addendum)
Get help right away if: You have a fever. You have severe pain in your chest or your abdomen. You have large amount of blood in your stool (feces). You have large amount of blood in your vomit.

## 2022-01-06 NOTE — ED Provider Notes (Signed)
[facility]   CSN: [id number] Arrival date & time: 01/05/22  2332     History  Chief Complaint  Patient presents with   Swallowed Foreign Body    Alison Sandoval is a 26 y.o. female who presents with complaint of swallowing a chipped tooth.  Patient states that around 1030 tonight her tooth broke interfragment went down her throat.  She had some coughing and is unsure if she might of coughed something up but has a little bit of residual pain on her right side of her throat.  She denies hemoptysis or hematemesis.    Swallowed Foreign Body       Home Medications Prior to Admission medications   Medication Sig Start Date End Date Taking? Authorizing Provider  acetaminophen (TYLENOL) 325 MG tablet Take 325 mg by mouth as needed for mild pain.     [provider]  citalopram (CELEXA) 20 MG tablet Take 1 tablet (20 mg total) by mouth daily. Take 1/2 tablet (10mg ) daily x 1 week, then 1 tablet (20mg ) daily thereafter 09/05/18   , CNM  Levonorgestrel (LILETTA, 52 MG,) 19.5 MCG/DAY IUD IUD 1 each by Intrauterine route once.    [provider]  omeprazole (PRILOSEC) 20 MG capsule Take 1 capsule (20 mg total) by mouth daily. 03/01/18   Cheral Marker, MD  Prenatal Vit-Fe Fumarate-FA (PRENATAL VITAMIN PLUS LOW IRON) 27-1 MG TABS TAKE 1 TABLET BY MOUTH ONCE DAILY AT 12 NOON 02/17/18   [provider]      Allergies    Caramel    Review of Systems   Review of Systems  Physical Exam Updated Vital Signs BP (!) 147/78   Pulse (!) 106   Temp 98 F (36.7 C)   Resp 20   Ht 5\' 4"  (1.626 m)   Wt 90.7 kg   LMP 12/29/2021 (Approximate)   SpO2 100%   BMI 34.33 kg/m  Physical Exam Vitals and nursing note reviewed.  Constitutional:      General: She is not in acute distress.    Appearance: She is well-developed. She is not diaphoretic.  HENT:     Head: Normocephalic and atraumatic.     Right  Ear: External ear normal.     Left Ear: External ear normal.     Nose: Nose normal.     Mouth/Throat:     Mouth: Mucous membranes are moist.     Pharynx: No oropharyngeal exudate or posterior oropharyngeal erythema.  Eyes:     General: No scleral icterus.    Conjunctiva/sclera: Conjunctivae normal.  Cardiovascular:     Rate and Rhythm: Normal rate and regular rhythm.     Heart sounds: Normal heart sounds. No murmur heard.    No friction rub. No gallop.  Pulmonary:     Effort: Pulmonary effort is normal. No respiratory distress.     Breath sounds: Normal breath sounds.  Abdominal:     General: Bowel sounds are normal. There is no distension.     Palpations: Abdomen is soft. There is no mass.     Tenderness: There is no abdominal tenderness. There is no guarding.  Musculoskeletal:     Cervical back: Normal range of motion.  Skin:    General: Skin is warm and dry.  Neurological:     Mental Status: She is alert and oriented to person, place, and time.  Psychiatric:        Behavior: Behavior normal.  ED Results / Procedures / Treatments   Labs (all labs ordered are listed, but only abnormal results are displayed) Labs Reviewed - No data to display  EKG None  Radiology DG Neck Soft Tissue  Result Date: 01/06/2022 CLINICAL DATA:  Possible ingestion of a chipped tooth EXAM: NECK SOFT TISSUES - 1+ VIEW COMPARISON:  Radiographs 12/21/2015 FINDINGS: There is no evidence of retropharyngeal soft tissue swelling or epiglottic enlargement. The cervical airway is unremarkable and no radio-opaque foreign body identified. IMPRESSION: No radiopaque foreign body. Electronically Signed   By: Minerva Fester M.D.   On: 01/06/2022 00:06    Procedures Procedures    Medications Ordered in ED Medications - No data to display  ED Course/ Medical Decision Making/ A&P                           Medical Decision Making  Patient here after swallowing a chipped tooth.  Plain film shows no  evidence of of tracheal perforation and no retained foreign bodies visible on plain film.  I personally visualized and interpreted these films.  Patient does not appear to have aspirated the tooth.  She was provided reassurance that she should pass this in her stool.  Encourage patient follow-up with dentist.  She appears otherwise appropriate for discharge at this time        Final Clinical Impression(s) / ED Diagnoses Final diagnoses:  None    Rx / DC Orders ED Discharge Orders     None         Arthor Captain, PA-C 01/06/22 1722    Palumbo, April, MD 01/06/22 2318

## 2022-02-16 ENCOUNTER — Emergency Department (HOSPITAL_COMMUNITY)
Admission: EM | Admit: 2022-02-16 | Discharge: 2022-02-16 | Disposition: A | Discharge status: Home / Self Care | Payer: Medicaid Other | Attending: Emergency Medicine | Admitting: Emergency Medicine

## 2022-02-16 ENCOUNTER — Encounter (HOSPITAL_COMMUNITY): Payer: Self-pay

## 2022-02-16 DIAGNOSIS — R112 Nausea with vomiting, unspecified: Secondary | ICD-10-CM | POA: Diagnosis not present

## 2022-02-16 DIAGNOSIS — K0889 Other specified disorders of teeth and supporting structures: Secondary | ICD-10-CM

## 2022-02-16 DIAGNOSIS — K029 Dental caries, unspecified: Secondary | ICD-10-CM | POA: Insufficient documentation

## 2022-02-16 DIAGNOSIS — E119 Type 2 diabetes mellitus without complications: Secondary | ICD-10-CM

## 2022-02-16 MED ORDER — HYDROCODONE-ACETAMINOPHEN 5-325 MG PO TABS: 1.0000 | ORAL_TABLET | ORAL | 0 refills | Status: DC | PRN

## 2022-02-16 MED ORDER — ONDANSETRON 4 MG PO TBDP: ORAL_TABLET | ORAL | 0 refills | Status: DC

## 2022-02-16 MED ORDER — HYDROCODONE-ACETAMINOPHEN 5-325 MG PO TABS
1.0000 | ORAL_TABLET | Freq: Once | ORAL | Status: AC
  Administered 2022-02-16: 1 via ORAL
  Filled 2022-02-16: qty 1

## 2022-02-16 MED ORDER — ONDANSETRON 8 MG PO TBDP
8.0000 mg | ORAL_TABLET | Freq: Once | ORAL | Status: AC
  Administered 2022-02-16: 8 mg via ORAL
  Filled 2022-02-16: qty 1

## 2022-02-16 MED ORDER — KETOROLAC TROMETHAMINE 60 MG/2ML IM SOLN
30.0000 mg | Freq: Once | INTRAMUSCULAR | Status: AC
  Administered 2022-02-16: 30 mg via INTRAMUSCULAR
  Filled 2022-02-16: qty 2

## 2022-02-16 NOTE — ED Provider Notes (Signed)
[facility] EMERGENCY DEPARTMENT Provider Note   CSN: [id number] Arrival date & time: 02/16/22  0401     History  Chief Complaint  Patient presents with   Dental Pain    Pt reports dental pain x 3 days. Pt went to dentist today and was scheduled for extractions in 2 days. Pt states that she has been taking OTC Ibuprofen and Tylenol and antibiotic and started vomiting tonight.     Alison Sandoval is a 26 y.o. female.  Patient presents to the emergency department for evaluation of dental pain.  Patient reports that symptoms have been ongoing for 3 days.  Patient complaining of pain on the right upper side of her mouth.  She saw dentist yesterday, was started on antibiotic but was not given anything for pain.  She has been having severe pain tonight.  She has tried taking Tylenol and ibuprofen alternately which she thinks upset her stomach.  She had some nausea and vomiting tonight.       Home Medications Prior to Admission medications   Medication Sig Start Date End Date Taking? Authorizing Provider  HYDROcodone-acetaminophen (NORCO/VICODIN) 5-325 MG tablet Take 1 tablet by mouth every 4 (four) hours as needed for moderate pain. 02/16/22  Yes Jadan Hinojos, Gwenyth Allegra, MD  ondansetron (ZOFRAN-ODT) 4 MG disintegrating tablet 4mg  ODT q4 hours prn nausea/vomit 02/16/22  Yes Yidel Teuscher, Gwenyth Allegra, MD  acetaminophen (TYLENOL) 325 MG tablet Take 325 mg by mouth as needed for mild pain.     [provider]  citalopram (CELEXA) 20 MG tablet Take 1 tablet (20 mg total) by mouth daily. Take 1/2 tablet (10mg ) daily x 1 week, then 1 tablet (20mg ) daily thereafter 09/05/18   Roma Schanz, CNM  Levonorgestrel (LILETTA, 52 MG,) 19.5 MCG/DAY IUD IUD 1 each by Intrauterine route once.    [provider]  omeprazole (PRILOSEC) 20 MG capsule Take 1 capsule (20 mg total) by mouth daily. 03/01/18   Jonnie Kind, MD  Prenatal Vit-Fe Fumarate-FA (PRENATAL VITAMIN PLUS LOW IRON) 27-1 MG  TABS TAKE 1 TABLET BY MOUTH ONCE DAILY AT 12 NOON 02/17/18   [provider]      Allergies    Caramel    Review of Systems   Review of Systems  Physical Exam Updated Vital Signs BP (!) 142/91   Pulse 98   Temp 98.3 F (36.8 C) (Oral)   Resp 18   Ht 5\' 5"  (1.651 m)   Wt 89.4 kg   LMP 01/03/2022   SpO2 100%   BMI 32.78 kg/m  Physical Exam Vitals and nursing note reviewed.  Constitutional:      General: She is not in acute distress.    Appearance: She is well-developed.  HENT:     Head: Normocephalic and atraumatic.     Mouth/Throat:     Mouth: Mucous membranes are moist.     Dentition: Dental tenderness and dental caries present. No dental abscesses.  Eyes:     General: Vision grossly intact. Gaze aligned appropriately.     Extraocular Movements: Extraocular movements intact.     Conjunctiva/sclera: Conjunctivae normal.  Cardiovascular:     Rate and Rhythm: Normal rate and regular rhythm.     Pulses: Normal pulses.     Heart sounds: Normal heart sounds, S1 normal and S2 normal. No murmur heard.    No friction rub. No gallop.  Pulmonary:     Effort: Pulmonary effort is normal. No respiratory distress.     Breath  sounds: Normal breath sounds.  Abdominal:     General: Bowel sounds are normal.     Palpations: Abdomen is soft.     Tenderness: There is no abdominal tenderness. There is no guarding or rebound.     Hernia: No hernia is present.  Musculoskeletal:        General: No swelling.     Cervical back: Full passive range of motion without pain, normal range of motion and neck supple. No spinous process tenderness or muscular tenderness. Normal range of motion.     Right lower leg: No edema.     Left lower leg: No edema.  Skin:    General: Skin is warm and dry.     Capillary Refill: Capillary refill takes less than 2 seconds.     Findings: No ecchymosis, erythema, rash or wound.  Neurological:     General: No focal deficit present.     Mental Status:  She is alert and oriented to person, place, and time.     GCS: GCS eye subscore is 4. GCS verbal subscore is 5. GCS motor subscore is 6.     Cranial Nerves: Cranial nerves 2-12 are intact.     Sensory: Sensation is intact.     Motor: Motor function is intact.     Coordination: Coordination is intact.  Psychiatric:        Attention and Perception: Attention normal.        Mood and Affect: Mood normal.        Speech: Speech normal.        Behavior: Behavior normal.     ED Results / Procedures / Treatments   Labs (all labs ordered are listed, but only abnormal results are displayed) Labs Reviewed - No data to display  EKG None  Radiology No results found.  Procedures Procedures    Medications Ordered in ED Medications  ketorolac (TORADOL) injection 30 mg (has no administration in time range)  ondansetron (ZOFRAN-ODT) disintegrating tablet 8 mg (has no administration in time range)  HYDROcodone-acetaminophen (NORCO/VICODIN) 5-325 MG per tablet 1 tablet (has no administration in time range)    ED Course/ Medical Decision Making/ A&P                           Medical Decision Making  Presents for evaluation predominantly of dental pain.  She did have some nausea and vomiting tonight which she thinks is secondary to taking the ibuprofen and Tylenol.  No abdominal pain or tenderness.  Patient does not have any signs of dental abscess currently.  She is on antibiotics and has dental follow-up.  Will provide analgesia.        Final Clinical Impression(s) / ED Diagnoses Final diagnoses:  Pain, dental    Rx / DC Orders ED Discharge Orders          Ordered    ondansetron (ZOFRAN-ODT) 4 MG disintegrating tablet        02/16/22 0446    HYDROcodone-acetaminophen (NORCO/VICODIN) 5-325 MG tablet  Every 4 hours PRN        02/16/22 0446              Gilda Crease, MD 02/16/22 [phone]

## 2022-06-21 ENCOUNTER — Encounter (HOSPITAL_BASED_OUTPATIENT_CLINIC_OR_DEPARTMENT_OTHER): Payer: Self-pay | Admitting: Emergency Medicine

## 2022-06-21 ENCOUNTER — Other Ambulatory Visit: Payer: Self-pay

## 2022-06-21 ENCOUNTER — Emergency Department (HOSPITAL_BASED_OUTPATIENT_CLINIC_OR_DEPARTMENT_OTHER)
Admission: EM | Admit: 2022-06-21 | Discharge: 2022-06-21 | Disposition: A | Discharge status: Home / Self Care | Payer: Medicaid Other | Attending: Emergency Medicine | Admitting: Emergency Medicine

## 2022-06-21 DIAGNOSIS — N189 Chronic kidney disease, unspecified: Secondary | ICD-10-CM | POA: Diagnosis not present

## 2022-06-21 DIAGNOSIS — B349 Viral infection, unspecified: Secondary | ICD-10-CM | POA: Insufficient documentation

## 2022-06-21 DIAGNOSIS — Z20822 Contact with and (suspected) exposure to covid-19: Secondary | ICD-10-CM | POA: Insufficient documentation

## 2022-06-21 DIAGNOSIS — E1122 Type 2 diabetes mellitus with diabetic chronic kidney disease: Secondary | ICD-10-CM | POA: Insufficient documentation

## 2022-06-21 DIAGNOSIS — J029 Acute pharyngitis, unspecified: Secondary | ICD-10-CM | POA: Diagnosis present

## 2022-06-21 LAB — GROUP A STREP BY PCR: Group A Strep by PCR: NOT DETECTED

## 2022-06-21 LAB — RESP PANEL BY RT-PCR (RSV, FLU A&B, COVID)  RVPGX2
Influenza A by PCR: NEGATIVE
Influenza B by PCR: NEGATIVE
Resp Syncytial Virus by PCR: NEGATIVE
SARS Coronavirus 2 by RT PCR: NEGATIVE

## 2022-06-21 NOTE — ED Notes (Signed)
Pt agreeable with d/c plan as discussed by provider - this nurse has verbally reinforced d/c instructions and provided pt with written copy.  Pt acknowledges verbal understanding and denies any addl questions, concerns, needs - ambulatory independently at d/c with steady gait- no acute changes/distress noted

## 2022-06-21 NOTE — ED Triage Notes (Signed)
Sore throat and nasal congestion started yesterday

## 2022-06-21 NOTE — ED Provider Notes (Signed)
[facility] EMERGENCY Provider Note: Georgena Spurling, MD, FACEP  CSN: [id number] MRN: [id number] ARRIVAL: 06/21/22 at [place]: [id number]   CHIEF COMPLAINT  Sore Throat   HISTORY OF PRESENT ILLNESS  06/21/22 3:07 AM Alison Sandoval is a 27 y.o. female who woke up yesterday morning with rhinorrhea, sinus pressure and sore throat.  She has not had a fever or cough.  She rates her throat pain as a 6 out of 10, worse with swallowing.   Past Medical History:  Diagnosis Date   Chronic kidney disease    UTI   Diabetes mellitus without complication (HCC)    GERD (gastroesophageal reflux disease)    Gestational diabetes    Headache    Medical history non-contributory     Past Surgical History:  Procedure Laterality Date   CESAREAN SECTION N/A 06/16/2018   Procedure: CESAREAN SECTION;  Surgeon: Mora Bellman, MD;  Location: [place];  Service: Obstetrics;  Laterality: N/A;   NO PAST SURGERIES      Family History  Problem Relation Age of Onset   Hypertension Paternal Grandmother    Hyperlipidemia Maternal Grandmother    Hypertension Father    Hyperlipidemia Father    Hypertension Mother    Asthma Mother     Social History   Tobacco Use   Smoking status: Never   Smokeless tobacco: Never  Vaping Use   Vaping Use: Every day  Substance Use Topics   Alcohol use: Not Currently    Comment: social   Drug use: Yes    Types: Marijuana    Comment: daily    Prior to Admission medications   Medication Sig Start Date End Date Taking? Authorizing Provider  acetaminophen (TYLENOL) 325 MG tablet Take 325 mg by mouth as needed for mild pain.     [provider]  citalopram (CELEXA) 20 MG tablet Take 1 tablet (20 mg total) by mouth daily. Take 1/2 tablet (10mg ) daily x 1 week, then 1 tablet (20mg ) daily thereafter 09/05/18   Roma Schanz, CNM  HYDROcodone-acetaminophen (NORCO/VICODIN) 5-325 MG tablet Take 1 tablet by mouth every 4 (four) hours as needed for  moderate pain. 02/16/22   Orpah Greek, MD  Levonorgestrel (LILETTA, 52 MG,) 19.5 MCG/DAY IUD IUD 1 each by Intrauterine route once.    [provider]  omeprazole (PRILOSEC) 20 MG capsule Take 1 capsule (20 mg total) by mouth daily. 03/01/18   Jonnie Kind, MD  ondansetron (ZOFRAN-ODT) 4 MG disintegrating tablet 4mg  ODT q4 hours prn nausea/vomit 02/16/22   Pollina, Gwenyth Allegra, MD  Prenatal Vit-Fe Fumarate-FA (PRENATAL VITAMIN PLUS LOW IRON) 27-1 MG TABS TAKE 1 TABLET BY MOUTH ONCE DAILY AT 12 NOON 02/17/18   [provider]    Allergies Caramel   REVIEW OF SYSTEMS  Negative except as noted here or in the History of Present Illness.   PHYSICAL EXAMINATION  Initial Vital Signs Blood pressure 118/80, pulse 87, temperature 98.1 F (36.7 C), temperature source Oral, resp. rate 16, last menstrual period 06/09/2022, SpO2 100 %.  Examination General: Well-developed, well-nourished female in no acute distress; appearance consistent with age of record HENT: normocephalic; atraumatic; no pharyngeal erythema or exudate Eyes: Normal appearance Neck: supple Heart: regular rate and rhythm Lungs: clear to auscultation bilaterally Abdomen: soft; nondistended; nontender; bowel sounds present Extremities: No deformity; full range of motion Neurologic: Awake, alert and oriented; motor function intact in all extremities and symmetric; no facial droop Skin: Warm and dry Psychiatric: Normal mood and  affect   RESULTS  Summary of this visit's results, reviewed and interpreted by myself:   EKG Interpretation  Date/Time:    Ventricular Rate:    PR Interval:    QRS Duration:   QT Interval:    QTC Calculation:   R Axis:     Text Interpretation:         Laboratory Studies: Results for orders placed or performed during the hospital encounter of 06/21/22 (from the past 24 hour(s))  Resp panel by RT-PCR (RSV, Flu A&B, Covid) Anterior Nasal Swab     Status: None    Collection Time: 06/21/22  2:50 AM   Specimen: Anterior Nasal Swab  Result Value Ref Range   SARS Coronavirus 2 by RT PCR NEGATIVE NEGATIVE   Influenza A by PCR NEGATIVE NEGATIVE   Influenza B by PCR NEGATIVE NEGATIVE   Resp Syncytial Virus by PCR NEGATIVE NEGATIVE  Group A Strep by PCR     Status: None   Collection Time: 06/21/22  2:50 AM   Specimen: Anterior Nasal Swab; Sterile Swab  Result Value Ref Range   Group A Strep by PCR NOT DETECTED NOT DETECTED   Imaging Studies: No results found.  ED COURSE and MDM  Nursing notes, initial and subsequent vitals signs, including pulse oximetry, reviewed and interpreted by myself.  Vitals:   06/21/22 0244  BP: 118/80  Pulse: 87  Resp: 16  Temp: 98.1 F (36.7 C)  TempSrc: Oral  SpO2: 100%   Medications - No data to display  Presentation can assistant with a viral illness.  She is negative for tested viruses and strep.  PROCEDURES  Procedures   ED DIAGNOSES     ICD-10-CM   1. Viral illness  B34.9          Keyarra Rendall, MD 06/21/22 0403

## 2022-12-16 DIAGNOSIS — N390 Urinary tract infection, site not specified: Secondary | ICD-10-CM | POA: Diagnosis not present

## 2022-12-16 DIAGNOSIS — R1031 Right lower quadrant pain: Secondary | ICD-10-CM | POA: Diagnosis not present

## 2022-12-16 DIAGNOSIS — Z3202 Encounter for pregnancy test, result negative: Secondary | ICD-10-CM | POA: Diagnosis not present

## 2023-01-03 ENCOUNTER — Ambulatory Visit: Payer: Medicaid Other | Admitting: Obstetrics & Gynecology

## 2023-01-03 ENCOUNTER — Encounter: Payer: Self-pay | Admitting: Obstetrics & Gynecology

## 2023-01-03 ENCOUNTER — Other Ambulatory Visit (HOSPITAL_COMMUNITY)
Admission: RE | Admit: 2023-01-03 | Discharge: 2023-01-03 | Disposition: A | Discharge status: Home / Self Care | Payer: Medicaid Other | Source: Ambulatory Visit | Attending: Obstetrics & Gynecology | Admitting: Obstetrics & Gynecology

## 2023-01-03 VITALS — BP 124/84 | HR 96 | Ht 64.0 in | Wt 207.4 lb

## 2023-01-03 DIAGNOSIS — Z124 Encounter for screening for malignant neoplasm of cervix: Secondary | ICD-10-CM | POA: Insufficient documentation

## 2023-01-03 DIAGNOSIS — Z3009 Encounter for other general counseling and advice on contraception: Secondary | ICD-10-CM

## 2023-01-03 DIAGNOSIS — Z30432 Encounter for removal of intrauterine contraceptive device: Secondary | ICD-10-CM | POA: Diagnosis not present

## 2023-01-03 NOTE — Progress Notes (Signed)
   GYN VISIT Patient name: Alison Sandoval MRN [id number]  Date of birth: 21-Mar-1996 Chief Complaint:   Gynecologic Exam (IUD removal )  History of Present Illness:   Alison Sandoval is a 27 y.o. G53P1001 female being seen today for:  Family plan: Patient desires removal of her IUD as she is considering another pregnancy.  Patient has noted some right sided pelvic pain, seen in urgent care and no abnormalities were noted patient feels like her pain is mostly related to what she is eating.  She does note some gas discomfort denies diarrhea or constipation.  Denies issues with bleeding, discharge or irritation.  She reports no other acute concerns  No LMP recorded. (Menstrual status: IUD).    Review of Systems:   Pertinent items are noted in HPI Denies fever/chills, dizziness, headaches, visual disturbances, fatigue, shortness of breath, chest pain, vomiting. Pertinent History Reviewed:   Past Surgical History:  Procedure Laterality Date   CESAREAN SECTION N/A 06/16/2018   Procedure: CESAREAN SECTION;  Surgeon: Catalina Antigua, MD;  Location: [facility];  Service: Obstetrics;  Laterality: N/A;   NO PAST SURGERIES      Past Medical History:  Diagnosis Date   Chronic kidney disease    UTI   Diabetes mellitus without complication (HCC)    GERD (gastroesophageal reflux disease)    Gestational diabetes    Headache    Medical history non-contributory    Reviewed problem list, medications and allergies. Physical Assessment:   Vitals:   01/03/23 1333  BP: 124/84  Pulse: 96  Weight: 207 lb 6.4 oz (94.1 kg)  Height: 5\' 4"  (1.626 m)  Body mass index is 35.6 kg/m.       Physical Examination:   General appearance: alert, well appearing, and in no distress  Psych: mood appropriate, normal affect  Skin: warm & dry   Cardiovascular: normal heart rate noted  Respiratory: normal respiratory effort, no distress  Abdomen: soft, non-tender, no rebound no guarding  Pelvic:  VULVA: normal appearing vulva with no masses, tenderness or lesions, VAGINA: normal appearing vagina with normal color and discharge, no lesions, CERVIX: normal appearing cervix without discharge or lesions, strings visualized  Extremities: no edema   Chaperone:  Dr. Wyn Forster      IUD REMOVAL   Time out was performed.  A sterile speculum was placed in the vagina.  The cervix was visualized, and the strings visible. They were grasped and the Mirena  IUD was easily removed intact without complications. The patient tolerated the procedure well.   Assessment & Plan:  1) Family planning -Advised "healthy you " -Encourage patient to take prenatal vitamin daily -Reviewed fecundity -Patient to follow-up as needed or in 1 year for annual  2) IUD removal -Device removed without complications  3) preventive screening -Pap smear obtained, reviewed ASCCP guidelines   No orders of the defined types were placed in this encounter.   Return in about 1 year (around 01/03/2024) for Annual.   Myna Hidalgo, DO Attending Obstetrician & Gynecologist, [facility] for [facility], [facility]

## 2023-01-04 DIAGNOSIS — H5213 Myopia, bilateral: Secondary | ICD-10-CM | POA: Diagnosis not present

## 2023-02-04 ENCOUNTER — Emergency Department (HOSPITAL_BASED_OUTPATIENT_CLINIC_OR_DEPARTMENT_OTHER)
Admission: EM | Admit: 2023-02-04 | Discharge: 2023-02-05 | Disposition: A | Discharge status: Home / Self Care | Payer: Medicaid Other | Attending: Emergency Medicine | Admitting: Emergency Medicine

## 2023-02-04 ENCOUNTER — Encounter (HOSPITAL_BASED_OUTPATIENT_CLINIC_OR_DEPARTMENT_OTHER): Payer: Self-pay

## 2023-02-04 DIAGNOSIS — R1013 Epigastric pain: Secondary | ICD-10-CM

## 2023-02-04 DIAGNOSIS — R109 Unspecified abdominal pain: Secondary | ICD-10-CM | POA: Diagnosis present

## 2023-02-04 DIAGNOSIS — K219 Gastro-esophageal reflux disease without esophagitis: Secondary | ICD-10-CM | POA: Diagnosis not present

## 2023-02-04 MED ORDER — LACTATED RINGERS IV BOLUS
1000.0000 mL | Freq: Once | INTRAVENOUS | Status: AC
  Administered 2023-02-05: 1000 mL via INTRAVENOUS

## 2023-02-04 MED ORDER — ONDANSETRON HCL 4 MG/2ML IJ SOLN
4.0000 mg | Freq: Once | INTRAMUSCULAR | Status: AC
  Administered 2023-02-05: 4 mg via INTRAVENOUS
  Filled 2023-02-04: qty 2

## 2023-02-04 NOTE — ED Triage Notes (Signed)
Pt c/o epigastric pain x "mos." States that she has been unable to tolerate PO for the last few days. Endorses associated nausea "for sure," denies vomiting. Last BM this AM, normal.

## 2023-02-05 ENCOUNTER — Emergency Department (HOSPITAL_BASED_OUTPATIENT_CLINIC_OR_DEPARTMENT_OTHER): Payer: Medicaid Other

## 2023-02-05 DIAGNOSIS — R1013 Epigastric pain: Secondary | ICD-10-CM | POA: Diagnosis not present

## 2023-02-05 LAB — COMPREHENSIVE METABOLIC PANEL
ALT: 76 U/L — ABNORMAL HIGH (ref 0–44)
AST: 38 U/L (ref 15–41)
Albumin: 4.3 g/dL (ref 3.5–5.0)
Alkaline Phosphatase: 138 U/L — ABNORMAL HIGH (ref 38–126)
Anion gap: 9 (ref 5–15)
BUN: 9 mg/dL (ref 6–20)
CO2: 24 mmol/L (ref 22–32)
Calcium: 9 mg/dL (ref 8.9–10.3)
Chloride: 105 mmol/L (ref 98–111)
Creatinine, Ser: 0.72 mg/dL (ref 0.44–1.00)
GFR, Estimated: >60 mL/min (ref >60)
Glucose, Bld: 109 mg/dL — ABNORMAL HIGH (ref 70–99)
Potassium: 4.1 mmol/L (ref 3.5–5.1)
Sodium: 138 mmol/L (ref 135–145)
Total Bilirubin: 0.4 mg/dL (ref 0.3–1.2)
Total Protein: 8.1 g/dL (ref 6.5–8.1)

## 2023-02-05 LAB — URINALYSIS, ROUTINE W REFLEX MICROSCOPIC
Bilirubin Urine: NEGATIVE
Glucose, UA: NEGATIVE mg/dL
Ketones, ur: NEGATIVE mg/dL
Nitrite: NEGATIVE
Specific Gravity, Urine: 1.033 — ABNORMAL HIGH (ref 1.005–1.030)
pH: 6 (ref 5.0–8.0)

## 2023-02-05 LAB — CBC WITH DIFFERENTIAL/PLATELET
Abs Immature Granulocytes: 0.03 10*3/uL (ref 0.00–0.07)
Basophils Absolute: 0 10*3/uL (ref 0.0–0.1)
Basophils Relative: 0 %
Eosinophils Absolute: 0.1 10*3/uL (ref 0.0–0.5)
Eosinophils Relative: 1 %
HCT: 41.3 % (ref 36.0–46.0)
Hemoglobin: 13.6 g/dL (ref 12.0–15.0)
Immature Granulocytes: 0 %
Lymphocytes Relative: 37 %
Lymphs Abs: 3.5 10*3/uL (ref 0.7–4.0)
MCH: 27.9 pg (ref 26.0–34.0)
MCHC: 32.9 g/dL (ref 30.0–36.0)
MCV: 84.6 fL (ref 80.0–100.0)
Monocytes Absolute: 0.7 10*3/uL (ref 0.1–1.0)
Monocytes Relative: 7 %
Neutro Abs: 5.1 10*3/uL (ref 1.7–7.7)
Neutrophils Relative %: 55 %
Platelets: 200 10*3/uL (ref 150–400)
RBC: 4.88 MIL/uL (ref 3.87–5.11)
RDW: 13.1 % (ref 11.5–15.5)
WBC: 9.4 10*3/uL (ref 4.0–10.5)
nRBC: 0 % (ref 0.0–0.2)

## 2023-02-05 LAB — PREGNANCY, URINE: Preg Test, Ur: NEGATIVE

## 2023-02-05 LAB — LIPASE, BLOOD: Lipase: 26 U/L (ref 11–51)

## 2023-02-05 MED ORDER — ALUM & MAG HYDROXIDE-SIMETH 200-200-20 MG/5ML PO SUSP
30.0000 mL | Freq: Once | ORAL | Status: AC
  Administered 2023-02-05: 30 mL via ORAL
  Filled 2023-02-05: qty 30

## 2023-02-05 MED ORDER — PANTOPRAZOLE SODIUM 40 MG PO TBEC: 40.0000 mg | DELAYED_RELEASE_TABLET | Freq: Every day | ORAL | 0 refills | Status: DC

## 2023-02-05 MED ORDER — ALUM & MAG HYDROXIDE-SIMETH 400-400-40 MG/5ML PO SUSP: 15.0000 mL | Freq: Four times a day (QID) | ORAL | 0 refills | Status: DC | PRN

## 2023-02-05 MED ORDER — PANTOPRAZOLE SODIUM 40 MG PO TBEC
40.0000 mg | DELAYED_RELEASE_TABLET | Freq: Once | ORAL | Status: AC
  Administered 2023-02-05: 40 mg via ORAL
  Filled 2023-02-05: qty 1

## 2023-02-05 MED ORDER — IOHEXOL 300 MG/ML  SOLN
100.0000 mL | Freq: Once | INTRAMUSCULAR | Status: AC | PRN
  Administered 2023-02-05: 100 mL via INTRAVENOUS

## 2023-02-05 MED ORDER — IOHEXOL 300 MG/ML  SOLN: 100.0000 mL | Freq: Once | INTRAMUSCULAR | Status: DC | PRN

## 2023-02-05 MED ORDER — LIDOCAINE VISCOUS HCL 2 % MT SOLN
15.0000 mL | Freq: Once | OROMUCOSAL | Status: AC
  Administered 2023-02-05: 15 mL via ORAL
  Filled 2023-02-05: qty 15

## 2023-02-05 NOTE — ED Provider Notes (Signed)
[place] EMERGENCY DEPARTMENT AT [facility] Provider Note   CSN: [id number] Arrival date & time: 02/04/23  2348     History  Chief Complaint  Patient presents with   Abdominal Pain    Alison Sandoval is a 27 y.o. female.  27 yo F here with abdominal pain. Has had it on and off for the last year or so but the last few days has had worsening issues associated with nausea, bloating and decreased appeitite. Pregnancy tests at home were negative. Hasn't tried anything for symptoms. Vapes tobacco, no nsaids, no etoh. No history of bowel bladder or pancreas problems that she knows of.  Been urgent care previously without a legitimate answer.  States that symptoms are often worse after eating or waking up in the morning.  She has some increased fatigue as well.   Abdominal Pain      Home Medications Prior to Admission medications   Medication Sig Start Date End Date Taking? Authorizing Provider  alum & mag hydroxide-simeth (MAALOX PLUS) 400-400-40 MG/5ML suspension Take 15 mLs by mouth every 6 (six) hours as needed for indigestion. 02/05/23  Yes Vena Bassinger, Barbara Cower, MD  pantoprazole (PROTONIX) 40 MG tablet Take 1 tablet (40 mg total) by mouth daily. 02/05/23  Yes Reene Harlacher, Barbara Cower, MD  Levonorgestrel (LILETTA, 52 MG,) 19.5 MCG/DAY IUD IUD 1 each by Intrauterine route once.    [provider]      Allergies    Caramel    Review of Systems   Review of Systems  Gastrointestinal:  Positive for abdominal pain.    Physical Exam Updated Vital Signs BP (!) 120/92   Pulse 86   Temp 97.9 F (36.6 C)   Resp 18   SpO2 100%  Physical Exam Vitals and nursing note reviewed.  Constitutional:      Appearance: She is well-developed.  HENT:     Head: Normocephalic and atraumatic.  Cardiovascular:     Rate and Rhythm: Normal rate and regular rhythm.  Pulmonary:     Effort: Pulmonary effort is normal. No respiratory distress.     Breath sounds: No stridor. No wheezing.   Abdominal:     General: There is no distension.     Tenderness: There is no abdominal tenderness.  Musculoskeletal:     Cervical back: Normal range of motion.  Neurological:     Mental Status: She is alert.     ED Results / Procedures / Treatments   Labs (all labs ordered are listed, but only abnormal results are displayed) Labs Reviewed  COMPREHENSIVE METABOLIC PANEL - Abnormal; Notable for the following components:      Result Value   Glucose, Bld 109 (*)    ALT 76 (*)    Alkaline Phosphatase 138 (*)    All other components within normal limits  URINALYSIS, ROUTINE W REFLEX MICROSCOPIC - Abnormal; Notable for the following components:   APPearance HAZY (*)    Specific Gravity, Urine 1.033 (*)    Hgb urine dipstick TRACE (*)    Protein, ur TRACE (*)    Leukocytes,Ua SMALL (*)    Bacteria, UA RARE (*)    All other components within normal limits  CBC WITH DIFFERENTIAL/PLATELET  LIPASE, BLOOD  PREGNANCY, URINE    EKG EKG Interpretation Date/Time:  Sunday February 05 2023 00:44:13 EDT Ventricular Rate:  81 PR Interval:  142 QRS Duration:  81 QT Interval:  341 QTC Calculation: 396 R Axis:   59  Text Interpretation: Sinus rhythm Confirmed by  Kierah Goatley, Barbara Cower [phone]) on 02/05/2023 1:51:09 AM  Radiology CT ABDOMEN PELVIS W CONTRAST  Result Date: 02/05/2023 CLINICAL DATA:  Epigastric pain for months with nausea. EXAM: CT ABDOMEN AND PELVIS WITH CONTRAST TECHNIQUE: Multidetector CT imaging of the abdomen and pelvis was performed using the standard protocol following bolus administration of intravenous contrast. RADIATION DOSE REDUCTION: This exam was performed according to the departmental dose-optimization program which includes automated exposure control, adjustment of the mA and/or kV according to patient size and/or use of iterative reconstruction technique. CONTRAST:  OMNIPAQUE IOHEXOL 300 MG/ML  SOLN COMPARISON:  None Available. FINDINGS: Lower chest: No acute  abnormality. Hepatobiliary: No focal liver abnormality is seen. No gallstones, gallbladder wall thickening, or biliary dilatation. Pancreas: Unremarkable. No pancreatic ductal dilatation or surrounding inflammatory changes. Spleen: Normal in size without focal abnormality. Adrenals/Urinary Tract: Adrenal glands are unremarkable. Kidneys are normal, without renal calculi, focal lesion, or hydronephrosis. Bladder is unremarkable. Stomach/Bowel: Stomach is within normal limits. Appendix appears normal. No evidence of bowel wall thickening, distention, or inflammatory changes. No free air or pneumatosis. Vascular/Lymphatic: No significant vascular findings are present. No enlarged abdominal or pelvic lymph nodes. Reproductive: The uterus is within normal limits. There is a small cyst with a hyperdense rim in the right adnexa, likely corpus luteal or hemorrhagic cyst. No adnexal mass on the left. Other: No abdominopelvic ascites. Musculoskeletal: No fracture is seen. IMPRESSION: No acute intra-abdominal process. Electronically Signed   By: Thornell Sartorius M.D.   On: 02/05/2023 02:33    Procedures Procedures    Medications Ordered in ED Medications  iohexol (OMNIPAQUE) 300 MG/ML solution 100 mL (has no administration in time range)  lactated ringers bolus 1,000 mL (0 mLs Intravenous Stopped 02/05/23 0133)  ondansetron (ZOFRAN) injection 4 mg (4 mg Intravenous Given 02/05/23 0020)  iohexol (OMNIPAQUE) 300 MG/ML solution 100 mL (100 mLs Intravenous Contrast Given 02/05/23 0220)  alum & mag hydroxide-simeth (MAALOX/MYLANTA) 200-200-20 MG/5ML suspension 30 mL (30 mLs Oral Given 02/05/23 0330)    And  lidocaine (XYLOCAINE) 2 % viscous mouth solution 15 mL (15 mLs Oral Given 02/05/23 0330)  pantoprazole (PROTONIX) EC tablet 40 mg (40 mg Oral Given 02/05/23 0329)    ED Course/ Medical Decision Making/ A&P                                 Medical Decision Making Amount and/or Complexity of Data Reviewed Labs:  ordered. Radiology: ordered. ECG/medicine tests: ordered.  Risk OTC drugs. Prescription drug management.  Symptoms seem most consistent with GERD/peptic ulcer disease.  Will workup evaluate for pancreatitis, cholecystitis, hepatitis or cirrhosis. Patient had mildly elevated ALT and alk phos.  AST and bilirubin were normal.  She also had some hematuria so CT done to make sure there is no kidney stone or biliary pathology and this was reassuring.  Patient felt better after GI medications I suspect this is more gastritis/peptic ulcer disease.  Will start PPIs and as needed Maalox and follow-up with PCP to ensure she is improving and whether or not she needs a GI referral.  Pain-free at time of discharge.  Tolerating p.o.  Final Clinical Impression(s) / ED Diagnoses Final diagnoses:  Epigastric pain  Gastroesophageal reflux disease, unspecified whether esophagitis present    Rx / DC Orders ED Discharge Orders          Ordered    pantoprazole (PROTONIX) 40 MG tablet  Daily  02/05/23 0333    alum & mag hydroxide-simeth (MAALOX PLUS) 400-400-40 MG/5ML suspension  Every 6 hours PRN        02/05/23 0333              Shundra Wirsing, Barbara Cower, MD 02/05/23 6387

## 2023-03-17 ENCOUNTER — Ambulatory Visit (INDEPENDENT_AMBULATORY_CARE_PROVIDER_SITE_OTHER): Payer: Medicaid Other

## 2023-03-17 ENCOUNTER — Other Ambulatory Visit: Payer: Self-pay | Admitting: Adult Health

## 2023-03-17 VITALS — BP 112/71 | HR 94 | Ht 64.0 in | Wt 212.8 lb

## 2023-03-17 DIAGNOSIS — Z3201 Encounter for pregnancy test, result positive: Secondary | ICD-10-CM | POA: Diagnosis not present

## 2023-03-17 LAB — POCT URINE PREGNANCY: Preg Test, Ur: POSITIVE — AB

## 2023-03-17 MED ORDER — ONDANSETRON HCL 4 MG PO TABS: 4.0000 mg | ORAL_TABLET | Freq: Three times a day (TID) | ORAL | 1 refills | Status: DC | PRN

## 2023-03-17 NOTE — Progress Notes (Signed)
   NURSE VISIT- PREGNANCY CONFIRMATION   SUBJECTIVE:  Alison Sandoval is a 27 y.o. G66P1001 female at [redacted]w[redacted]d by certain LMP of Patient's last menstrual period was 02/08/2023 (exact date). Here for pregnancy confirmation.  Home pregnancy test: positive x 4   She reports nausea and vomiting.  She is taking prenatal vitamins.    OBJECTIVE:  BP 112/71 (BP Location: Left Arm, Patient Position: Sitting, Cuff Size: Large)   Pulse 94   Ht 5\' 4"  (1.626 m)   Wt 212 lb 12.8 oz (96.5 kg)   LMP 02/08/2023 (Exact Date) Comment: Shorter than normal  BMI 36.53 kg/m   Appears well, in no apparent distress  Results for orders placed or performed in visit on 03/17/23 (from the past 24 hour(s))  POCT urine pregnancy   Collection Time: 03/17/23 11:52 AM  Result Value Ref Range   Preg Test, Ur Positive (A) Negative    ASSESSMENT: Positive pregnancy test, [redacted]w[redacted]d by LMP    PLAN: Schedule for dating ultrasound in 2 weeks Prenatal vitamins: continue   Nausea medicines: requested-note routed to Cyril Mourning NP to send prescription   OB packet given: Herminio Commons  03/17/2023 12:08 PM

## 2023-03-20 ENCOUNTER — Telehealth: Payer: Self-pay | Admitting: Adult Health

## 2023-03-20 NOTE — Telephone Encounter (Signed)
error 

## 2023-03-20 NOTE — Telephone Encounter (Signed)
Patient lives in [place] and would like her Zofran sent to the [facility] at [address] [place], [place], [place] [address].

## 2023-03-20 NOTE — Telephone Encounter (Addendum)
Pt was advised she should be able to have [facility] in [place] transfer the Zofran prescription to the new pharmacy. New pharmacy placed in chart. Pt voiced understanding. JSY

## 2023-03-24 ENCOUNTER — Encounter (HOSPITAL_BASED_OUTPATIENT_CLINIC_OR_DEPARTMENT_OTHER): Payer: Self-pay

## 2023-03-24 ENCOUNTER — Emergency Department (HOSPITAL_BASED_OUTPATIENT_CLINIC_OR_DEPARTMENT_OTHER)
Admission: EM | Admit: 2023-03-24 | Discharge: 2023-03-24 | Disposition: A | Discharge status: Home / Self Care | Payer: Medicaid Other | Attending: Emergency Medicine | Admitting: Emergency Medicine

## 2023-03-24 ENCOUNTER — Emergency Department (HOSPITAL_BASED_OUTPATIENT_CLINIC_OR_DEPARTMENT_OTHER): Payer: Medicaid Other

## 2023-03-24 ENCOUNTER — Other Ambulatory Visit: Payer: Self-pay

## 2023-03-24 DIAGNOSIS — R1013 Epigastric pain: Secondary | ICD-10-CM | POA: Insufficient documentation

## 2023-03-24 DIAGNOSIS — Z3A01 Less than 8 weeks gestation of pregnancy: Secondary | ICD-10-CM | POA: Diagnosis not present

## 2023-03-24 DIAGNOSIS — R1031 Right lower quadrant pain: Secondary | ICD-10-CM | POA: Diagnosis not present

## 2023-03-24 DIAGNOSIS — R1033 Periumbilical pain: Secondary | ICD-10-CM | POA: Insufficient documentation

## 2023-03-24 DIAGNOSIS — O208 Other hemorrhage in early pregnancy: Secondary | ICD-10-CM | POA: Diagnosis not present

## 2023-03-24 DIAGNOSIS — R1011 Right upper quadrant pain: Secondary | ICD-10-CM | POA: Diagnosis not present

## 2023-03-24 DIAGNOSIS — O26891 Other specified pregnancy related conditions, first trimester: Secondary | ICD-10-CM | POA: Diagnosis not present

## 2023-03-24 DIAGNOSIS — R109 Unspecified abdominal pain: Secondary | ICD-10-CM

## 2023-03-24 DIAGNOSIS — Z3490 Encounter for supervision of normal pregnancy, unspecified, unspecified trimester: Secondary | ICD-10-CM

## 2023-03-24 LAB — URINALYSIS, ROUTINE W REFLEX MICROSCOPIC
Bilirubin Urine: NEGATIVE
Glucose, UA: NEGATIVE mg/dL
Hgb urine dipstick: NEGATIVE
Ketones, ur: NEGATIVE mg/dL
Leukocytes,Ua: NEGATIVE
Nitrite: NEGATIVE
Protein, ur: NEGATIVE mg/dL
Specific Gravity, Urine: <1.005 — ABNORMAL LOW (ref 1.005–1.030)
pH: 7 (ref 5.0–8.0)

## 2023-03-24 LAB — CBC
HCT: 37.8 % (ref 36.0–46.0)
Hemoglobin: 12.8 g/dL (ref 12.0–15.0)
MCH: 27.8 pg (ref 26.0–34.0)
MCHC: 33.9 g/dL (ref 30.0–36.0)
MCV: 82 fL (ref 80.0–100.0)
Platelets: 216 10*3/uL (ref 150–400)
RBC: 4.61 MIL/uL (ref 3.87–5.11)
RDW: 13.7 % (ref 11.5–15.5)
WBC: 9.3 10*3/uL (ref 4.0–10.5)
nRBC: 0 % (ref 0.0–0.2)

## 2023-03-24 LAB — COMPREHENSIVE METABOLIC PANEL
ALT: 46 U/L — ABNORMAL HIGH (ref 0–44)
AST: 25 U/L (ref 15–41)
Albumin: 4 g/dL (ref 3.5–5.0)
Alkaline Phosphatase: 93 U/L (ref 38–126)
Anion gap: 7 (ref 5–15)
BUN: 6 mg/dL (ref 6–20)
CO2: 24 mmol/L (ref 22–32)
Calcium: 9.3 mg/dL (ref 8.9–10.3)
Chloride: 103 mmol/L (ref 98–111)
Creatinine, Ser: 0.6 mg/dL (ref 0.44–1.00)
GFR, Estimated: >60 mL/min (ref >60)
Glucose, Bld: 91 mg/dL (ref 70–99)
Potassium: 4.1 mmol/L (ref 3.5–5.1)
Sodium: 134 mmol/L — ABNORMAL LOW (ref 135–145)
Total Bilirubin: 0.5 mg/dL (ref 0.3–1.2)
Total Protein: 7.7 g/dL (ref 6.5–8.1)

## 2023-03-24 LAB — HCG, QUANTITATIVE, PREGNANCY: hCG, Beta Chain, Quant, S: 105670 m[IU]/mL — ABNORMAL HIGH (ref <5)

## 2023-03-24 LAB — LIPASE, BLOOD: Lipase: 14 U/L (ref 11–51)

## 2023-03-24 NOTE — Discharge Instructions (Signed)
Follow up with your OB/GYN for prenatal care as scheduled.   If you develop worsening abdominal pain, uncontrolled vomiting, a fever or new concern, return to the emergency department for further evaluation.

## 2023-03-24 NOTE — ED Notes (Signed)
Reviewed AVS with patient, patient expressed understanding of directions, denies further questions at this time. 

## 2023-03-24 NOTE — ED Notes (Signed)
Pt reports RLQ abd pain with cramping that goes into RUQ and epigastric region, ongoing since beginning of October. Denies any vaginal bleeding. Had previous N/V but was prescribed medication from doctor and that has resolved. Pt last BM this morning, feels as though it was not normal, 'might be constipated'. Denies urinary sx other than frequency. Reports HA and pressure behind right eye for x2-3 days. Reports dizziness upon standing. Also reports increased fatigue.   Denies hx of HTN, denies hx of diabetes. Reports hx of gestational diabetes with previous pregnancy but is confused bc her MyChart states she has diabetes mellitus, is concerned about this and unclear of diagnoses.

## 2023-03-24 NOTE — ED Triage Notes (Addendum)
Pt POV from home reporting R side abd pain that radiates to pelvis, [redacted] weeks pregnant, called OB, advised to come to ED for evaluation. +urinary frequency, denies vaginal bleeding.

## 2023-03-24 NOTE — ED Provider Notes (Signed)
[place] EMERGENCY DEPARTMENT AT [facility] Provider Note   CSN: [id number] Arrival date & time: 03/24/23  1734     History  Chief Complaint  Patient presents with   Abdominal Pain    Alison Sandoval is a 27 y.o. female.  Patient to ED with pain in the abdomen for the past 3 weeks. She reports it comes and goes, worse when she eats, and when she wakes in the mornings. She reports she is about [redacted] weeks pregnant by dates, has had her first prenatal appointment. She is G2P1, high risk first pregnancy with gestational diabetes, threatened miscarriage and early delivery. She denies any vaginal bleeding or discharge. She feels she is urinating more frequently but denies dysuria. She has had nausea and vomiting of pregnancy, controlled with Zofran and denies any worsening symptoms. No fever. Last BM this morning, "hard".   The history is provided by the patient. No language interpreter was used.  Abdominal Pain      Home Medications Prior to Admission medications   Medication Sig Start Date End Date Taking? Authorizing Provider  ondansetron (ZOFRAN) 4 MG tablet Take 1 tablet (4 mg total) by mouth every 8 (eight) hours as needed. 03/17/23   Adline Potter, NP  alum & mag hydroxide-simeth (MAALOX PLUS) 400-400-40 MG/5ML suspension Take 15 mLs by mouth every 6 (six) hours as needed for indigestion. Patient not taking: Reported on 03/17/2023 02/05/23   Mesner, Barbara Cower, MD  OVER THE COUNTER MEDICATION Ashby Dawes made gummy prenatal vitamins    [provider]  pantoprazole (PROTONIX) 40 MG tablet Take 1 tablet (40 mg total) by mouth daily. Patient not taking: Reported on 03/17/2023 02/05/23   Mesner, Barbara Cower, MD      Allergies    Caramel    Review of Systems   Review of Systems  Gastrointestinal:  Positive for abdominal pain.    Physical Exam Updated Vital Signs BP (!) 113/96   Pulse 62   Temp 98.3 F (36.8 C) (Oral)   Resp 16   Ht 5\' 4"  (1.626 m)   Wt 97.1 kg    LMP 02/08/2023 (Exact Date) Comment: Shorter than normal  SpO2 100%   BMI 36.73 kg/m  Physical Exam Vitals and nursing note reviewed.  Constitutional:      Appearance: She is well-developed. She is obese.  Cardiovascular:     Rate and Rhythm: Normal rate.  Pulmonary:     Effort: Pulmonary effort is normal.  Abdominal:     General: There is no distension.     Palpations: Abdomen is soft.     Tenderness: There is abdominal tenderness in the right upper quadrant, right lower quadrant, epigastric area and periumbilical area. There is no right CVA tenderness or left CVA tenderness.  Skin:    General: Skin is warm and dry.  Neurological:     Mental Status: She is alert and oriented to person, place, and time.     ED Results / Procedures / Treatments   Labs (all labs ordered are listed, but only abnormal results are displayed) Labs Reviewed  COMPREHENSIVE METABOLIC PANEL - Abnormal; Notable for the following components:      Result Value   Sodium 134 (*)    ALT 46 (*)    All other components within normal limits  URINALYSIS, ROUTINE W REFLEX MICROSCOPIC - Abnormal; Notable for the following components:   Color, Urine COLORLESS (*)    Specific Gravity, Urine <1.005 (*)    Bacteria, UA RARE (*)  All other components within normal limits  HCG, QUANTITATIVE, PREGNANCY - Abnormal; Notable for the following components:   hCG, Beta Chain, Quant, S 105,670 (*)    All other components within normal limits  LIPASE, BLOOD  CBC   Results for orders placed or performed during the hospital encounter of 03/24/23  Lipase, blood  Result Value Ref Range   Lipase 14 11 - 51 U/L  Comprehensive metabolic panel  Result Value Ref Range   Sodium 134 (L) 135 - 145 mmol/L   Potassium 4.1 3.5 - 5.1 mmol/L   Chloride 103 98 - 111 mmol/L   CO2 24 22 - 32 mmol/L   Glucose, Bld 91 70 - 99 mg/dL   BUN 6 6 - 20 mg/dL   Creatinine, Ser 2.13 0.44 - 1.00 mg/dL   Calcium 9.3 8.9 - 08.6 mg/dL   Total  Protein 7.7 6.5 - 8.1 g/dL   Albumin 4.0 3.5 - 5.0 g/dL   AST 25 15 - 41 U/L   ALT 46 (H) 0 - 44 U/L   Alkaline Phosphatase 93 38 - 126 U/L   Total Bilirubin 0.5 0.3 - 1.2 mg/dL   GFR, Estimated >57 >84 mL/min   Anion gap 7 5 - 15  CBC  Result Value Ref Range   WBC 9.3 4.0 - 10.5 K/uL   RBC 4.61 3.87 - 5.11 MIL/uL   Hemoglobin 12.8 12.0 - 15.0 g/dL   HCT 69.6 29.5 - 28.4 %   MCV 82.0 80.0 - 100.0 fL   MCH 27.8 26.0 - 34.0 pg   MCHC 33.9 30.0 - 36.0 g/dL   RDW 13.2 44.0 - 10.2 %   Platelets 216 150 - 400 K/uL   nRBC 0.0 0.0 - 0.2 %  Urinalysis, Routine w reflex microscopic -Urine, Clean Catch  Result Value Ref Range   Color, Urine COLORLESS (A) YELLOW   APPearance CLEAR CLEAR   Specific Gravity, Urine <1.005 (L) 1.005 - 1.030   pH 7.0 5.0 - 8.0   Glucose, UA NEGATIVE NEGATIVE mg/dL   Hgb urine dipstick NEGATIVE NEGATIVE   Bilirubin Urine NEGATIVE NEGATIVE   Ketones, ur NEGATIVE NEGATIVE mg/dL   Protein, ur NEGATIVE NEGATIVE mg/dL   Nitrite NEGATIVE NEGATIVE   Leukocytes,Ua NEGATIVE NEGATIVE   RBC / HPF 0-5 0 - 5 RBC/hpf   WBC, UA 0-5 0 - 5 WBC/hpf   Bacteria, UA RARE (A) NONE SEEN   Squamous Epithelial / HPF 0-5 0 - 5 /HPF  hCG, quantitative, pregnancy  Result Value Ref Range   hCG, Beta Chain, Quant, S 105,670 (H) <5 mIU/mL    EKG None  Radiology US OB LESS THAN 14 WEEKS WITH OB TRANSVAGINAL  Result Date: 03/24/2023 CLINICAL DATA:  Right lower quadrant pain EXAM: OBSTETRIC <14 WK [place] AND TRANSVAGINAL OB US TECHNIQUE: Both transabdominal and transvaginal ultrasound examinations were performed for complete evaluation of the gestation as well as the maternal uterus, adnexal regions, and pelvic cul-de-sac. Transvaginal technique was performed to assess early pregnancy. COMPARISON:  None Available. FINDINGS: Intrauterine gestational sac: Single Yolk sac:  Visualized. Embryo:  Visualized. Cardiac Activity: Visualized. Heart Rate: 127 bpm CRL:  8.6 mm   6 w   6 d                   [place] EDC: 11/11/2023 Subchorionic hemorrhage:  Small subchorionic hemorrhage. Maternal uterus/adnexae: Normal appearance of the right ovary. Left ovary is not visualized. Trace free fluid in the pelvis. IMPRESSION: 1. Single  viable intrauterine pregnancy with estimated gestational age of [redacted] weeks 6 days. 2. Small subchorionic hemorrhage. Electronically Signed   By: Allegra Lai M.D.   On: 03/24/2023 21:08    Procedures Procedures    Medications Ordered in ED Medications - No data to display  ED Course/ Medical Decision Making/ A&P                                 Medical Decision Making This patient presents to the ED for concern of abdominal pain in pregnancy, this involves an extensive number of treatment options, and is a complaint that carries with it a high risk of complications and morbidity.  The differential diagnosis includes appendicitis, PUD, threatened miscarriage, infection, constipation, ectopic pregnancy   Co morbidities that complicate the patient evaluation  Previous high risk pregnancy   Additional history obtained:  Additional history obtained from partner   Lab Tests:  I Ordered, and personally interpreted labs.  The pertinent results include:  Beta HCG 105,670. No leukocytosis, anemia, electrolyte abnormality. No evidence of urinary infection.     Imaging Studies ordered:  I ordered imaging studies including OB US  I independently visualized and interpreted imaging which showed Confirms IUP I agree with the radiologist interpretation - 6w 6d IUP      Problem List / ED Course / Critical interventions / Medication management  Infection - no evidence infection Ectopic - [place] confirmed IUP Appendicitis - pain x 3 weeks, no leukocytosis No evidence inflammatory process  Social Determinants of Health:  Poor insight into care   Test / Admission - Considered:  Patient to ED with 3 weeks of abdominal pain, concerned for pregnancy. No cause for pain  identified but no overt infection or inflammatory process. VSS, afebrile. [place] confirmed IUP.  Stable for discharge. Strict return precautions discussed.     Amount and/or Complexity of Data Reviewed Labs: ordered. Radiology: ordered.           Final Clinical Impression(s) / ED Diagnoses Final diagnoses:  Intrauterine pregnancy  Abdominal pain, unspecified abdominal location    Rx / DC Orders ED Discharge Orders     None         Danne Harbor 03/24/23 2123    Terrilee Files, MD 03/25/23 [phone]

## 2023-03-31 ENCOUNTER — Other Ambulatory Visit: Payer: Self-pay | Admitting: Obstetrics & Gynecology

## 2023-03-31 DIAGNOSIS — O3680X0 Pregnancy with inconclusive fetal viability, not applicable or unspecified: Secondary | ICD-10-CM

## 2023-04-03 ENCOUNTER — Inpatient Hospital Stay (HOSPITAL_COMMUNITY)
Admission: AD | Admit: 2023-04-03 | Discharge: 2023-04-03 | Disposition: A | Discharge status: Home / Self Care | Payer: Medicaid Other | Attending: Obstetrics and Gynecology | Admitting: Obstetrics and Gynecology

## 2023-04-03 ENCOUNTER — Ambulatory Visit (INDEPENDENT_AMBULATORY_CARE_PROVIDER_SITE_OTHER): Payer: Medicaid Other | Admitting: Radiology

## 2023-04-03 ENCOUNTER — Encounter (HOSPITAL_COMMUNITY): Payer: Self-pay | Admitting: Obstetrics and Gynecology

## 2023-04-03 DIAGNOSIS — O99611 Diseases of the digestive system complicating pregnancy, first trimester: Secondary | ICD-10-CM | POA: Insufficient documentation

## 2023-04-03 DIAGNOSIS — K5903 Drug induced constipation: Secondary | ICD-10-CM | POA: Diagnosis not present

## 2023-04-03 DIAGNOSIS — O3680X0 Pregnancy with inconclusive fetal viability, not applicable or unspecified: Secondary | ICD-10-CM

## 2023-04-03 DIAGNOSIS — Z3A01 Less than 8 weeks gestation of pregnancy: Secondary | ICD-10-CM | POA: Diagnosis not present

## 2023-04-03 MED ORDER — SENNA 8.6 MG PO TABS: 1.0000 | ORAL_TABLET | Freq: Two times a day (BID) | ORAL | 0 refills | Status: DC | PRN

## 2023-04-03 MED ORDER — SMOG ENEMA
960.0000 mL | Freq: Once | RECTAL | Status: AC
  Administered 2023-04-03: 960 mL via RECTAL
  Filled 2023-04-03: qty 960

## 2023-04-03 NOTE — MAU Provider Note (Signed)
Constipation    S Ms. Nakeira Scullin is a 27 y.o. G75P1001 pregnant female at [redacted]w[redacted]d who presents to MAU today with complaint of constipation for 4 days. Pt states last two days she has been in tears despite taking Miralax (states has not been taking it as prescribed, one cap BID), warm fluids.  She's also been taking Zofran and believes this is why she is constipated. She has hx of hemorrhoids and concerned her perineum is swollen and "poking out." Has not done enema as was instructed by advice line to present here for evaluation.  Denies hematochezia, melena, liquid stool, N/V, F/C, or changes in urination. Denies GI hx.    Pertinent items noted in HPI and remainder of comprehensive ROS otherwise negative.   O BP 121/68 (BP Location: Right Arm)   Pulse 85   Temp 97.6 F (36.4 C) (Oral)   Resp 16   Ht 5\' 4"  (1.626 m)   Wt 96.9 kg   LMP 02/08/2023 (Exact Date) Comment: Shorter than normal  SpO2 100%   BMI 36.68 kg/m  Physical Exam Vitals and nursing note reviewed. Exam conducted with a chaperone present.  Constitutional:      General: She is not in acute distress.    Appearance: Normal appearance. She is obese. She is not ill-appearing.  HENT:     Head: Normocephalic and atraumatic.     Right Ear: External ear normal.     Left Ear: External ear normal.     Nose: Nose normal.     Mouth/Throat:     Pharynx: Oropharynx is clear.  Eyes:     Extraocular Movements: Extraocular movements intact.     Conjunctiva/sclera: Conjunctivae normal.  Cardiovascular:     Rate and Rhythm: Normal rate and regular rhythm.     Heart sounds: No murmur heard.    No friction rub. No gallop.  Pulmonary:     Effort: Pulmonary effort is normal. No respiratory distress.     Breath sounds: Normal breath sounds.  Abdominal:     General: Abdomen is flat. Bowel sounds are normal. There is no distension.     Palpations: Abdomen is soft.     Tenderness: There is abdominal tenderness.     Comments: Slight  TTP in LLQ during colonic massage over area of what feels to be colonic stool ball in lower third of descending colon  Genitourinary:    Comments: Small external hemorrhoid w/o swelling or erythema  Musculoskeletal:        General: No swelling. Normal range of motion.     Cervical back: Normal range of motion.  Skin:    General: Skin is warm and dry.  Neurological:     Mental Status: She is alert and oriented to person, place, and time. Mental status is at baseline.     Motor: No weakness.     Gait: Gait normal.  Psychiatric:        Mood and Affect: Mood normal.        Behavior: Behavior normal.        Thought Content: Thought content normal.        Judgment: Judgment normal.      MDM: MAU Course:  0830 Exam concerning for stool ball in lower descending colon.  Less likely any infectious process given exam and HPI.  Will start with smog enema.  1015 Pt finishing enema but already with significant output.  States feels much better.  Will plan to d/c after finishing enema with  new script for senna to go with miralax at home.  Counseled on proper hydration and proper toileting practices. Pt understand and agreeable.    A&P: #[redacted] weeks gestation  #Constipation, resolved - script for senna  Discharge from MAU in stable condition with strict/usual precautions Follow up at desired OBGYN as scheduled for ongoing prenatal care  Allergies as of 04/03/2023       Reactions   Caramel Rash        Medication List     TAKE these medications    alum & mag hydroxide-simeth 400-400-40 MG/5ML suspension Commonly known as: MAALOX PLUS Take 15 mLs by mouth every 6 (six) hours as needed for indigestion.   ondansetron 4 MG tablet Commonly known as: Zofran Take 1 tablet (4 mg total) by mouth every 8 (eight) hours as needed.   OVER THE COUNTER MEDICATION Ashby Dawes made gummy prenatal vitamins   pantoprazole 40 MG tablet Commonly known as: Protonix Take 1 tablet (40 mg total) by mouth  daily.   senna 8.6 MG Tabs tablet Commonly known as: SENOKOT Take 1 tablet (8.6 mg total) by mouth 2 (two) times daily as needed for moderate constipation.        Hessie Dibble, MD 04/03/2023 10:28 AM

## 2023-04-03 NOTE — MAU Note (Signed)
Alison Sandoval is a 27 y.o. at [redacted]w[redacted]d here in MAU reporting: been constipated for 4 days.  The last 2 days has been so horrible, has been in tears.  Taking Miralax (cap once in the morning and afternoon).   Taking zofran.  Has hemorrhoids.  Her perineum is swollen and "poking out'. Drinking warm fluids.  Called advice line, discussed getting an enema, they told her no and to come up here. Onset of complaint: ongoing x4 days.  Pain score: 7 Vitals:   04/03/23 0738  BP: 121/68  Pulse: 85  Resp: 16  Temp: 97.6 F (36.4 C)  SpO2: 100%      Lab orders placed from triage:

## 2023-04-03 NOTE — Discharge Instructions (Signed)
Miralax is the "MUSH" Senna is the "PUSH" But neither are helping much if you don't have enough water to help it slide.

## 2023-04-04 ENCOUNTER — Other Ambulatory Visit: Payer: Medicaid Other

## 2023-05-01 ENCOUNTER — Other Ambulatory Visit: Payer: Self-pay | Admitting: Obstetrics & Gynecology

## 2023-05-01 DIAGNOSIS — Z3682 Encounter for antenatal screening for nuchal translucency: Secondary | ICD-10-CM

## 2023-05-02 ENCOUNTER — Encounter: Payer: Self-pay | Admitting: Women's Health

## 2023-05-02 ENCOUNTER — Encounter: Payer: Medicaid Other | Admitting: *Deleted

## 2023-05-02 ENCOUNTER — Other Ambulatory Visit: Payer: Medicaid Other

## 2023-05-02 ENCOUNTER — Ambulatory Visit (INDEPENDENT_AMBULATORY_CARE_PROVIDER_SITE_OTHER): Payer: Medicaid Other | Admitting: Women's Health

## 2023-05-02 VITALS — BP 107/70 | HR 94 | Wt 207.0 lb

## 2023-05-02 DIAGNOSIS — Z3480 Encounter for supervision of other normal pregnancy, unspecified trimester: Secondary | ICD-10-CM

## 2023-05-02 DIAGNOSIS — Z3481 Encounter for supervision of other normal pregnancy, first trimester: Secondary | ICD-10-CM | POA: Diagnosis not present

## 2023-05-02 DIAGNOSIS — O09291 Supervision of pregnancy with other poor reproductive or obstetric history, first trimester: Secondary | ICD-10-CM | POA: Diagnosis not present

## 2023-05-02 DIAGNOSIS — Z3A11 11 weeks gestation of pregnancy: Secondary | ICD-10-CM

## 2023-05-02 DIAGNOSIS — Z31430 Encounter of female for testing for genetic disease carrier status for procreative management: Secondary | ICD-10-CM | POA: Diagnosis not present

## 2023-05-02 DIAGNOSIS — O0990 Supervision of high risk pregnancy, unspecified, unspecified trimester: Secondary | ICD-10-CM | POA: Insufficient documentation

## 2023-05-02 DIAGNOSIS — Z1332 Encounter for screening for maternal depression: Secondary | ICD-10-CM

## 2023-05-02 DIAGNOSIS — Z3682 Encounter for antenatal screening for nuchal translucency: Secondary | ICD-10-CM | POA: Diagnosis not present

## 2023-05-02 DIAGNOSIS — Z131 Encounter for screening for diabetes mellitus: Secondary | ICD-10-CM | POA: Diagnosis not present

## 2023-05-02 DIAGNOSIS — Z98891 History of uterine scar from previous surgery: Secondary | ICD-10-CM | POA: Insufficient documentation

## 2023-05-02 DIAGNOSIS — O09299 Supervision of pregnancy with other poor reproductive or obstetric history, unspecified trimester: Secondary | ICD-10-CM | POA: Diagnosis not present

## 2023-05-02 DIAGNOSIS — Z23 Encounter for immunization: Secondary | ICD-10-CM | POA: Diagnosis not present

## 2023-05-02 DIAGNOSIS — Z6836 Body mass index (BMI) 36.0-36.9, adult: Secondary | ICD-10-CM

## 2023-05-02 DIAGNOSIS — Z8632 Personal history of gestational diabetes: Secondary | ICD-10-CM | POA: Insufficient documentation

## 2023-05-02 DIAGNOSIS — Z3490 Encounter for supervision of normal pregnancy, unspecified, unspecified trimester: Secondary | ICD-10-CM | POA: Insufficient documentation

## 2023-05-02 MED ORDER — ASPIRIN 81 MG PO TBEC: 81.0000 mg | DELAYED_RELEASE_TABLET | Freq: Every day | ORAL | 3 refills | Status: DC

## 2023-05-02 MED ORDER — BLOOD PRESSURE MONITOR MISC: 0 refills | Status: DC

## 2023-05-02 NOTE — Progress Notes (Signed)
[place] 11+6 wks,measurements c/w dates,NB present,NT 1.5 mm,FHR 160 bpm,normal ovaries,CRL 60.75 mm

## 2023-05-02 NOTE — Progress Notes (Signed)
INITIAL OBSTETRICAL VISIT Patient name: Alison Sandoval MRN [id number]  Date of birth: [birth year] Chief Complaint:   Initial Prenatal Visit  History of Present Illness:   Alison Sandoval is a 27 y.o. G33P1001 African-American female at [redacted]w[redacted]d by LMP c/w u/s at 8 weeks with an Estimated Date of Delivery: 11/15/23 being seen today for her initial obstetrical visit.   Patient's last menstrual period was 02/08/2023 (exact date). Her obstetrical history is significant for  C/S for NRFHR @ 7cm during IOL, had A1DM .   Today she reports  some nausea, zofran helps. Some hip/low back pain .  Last pap 01/03/23. Results were: NILM w/ HRHPV negative     05/02/2023   10:32 AM 01/03/2023    1:27 PM 04/16/2018    4:14 PM 04/16/2018    4:13 PM 04/06/2018   10:34 AM  Depression screen PHQ 2/9  Decreased Interest 0 1 1 1  0  Down, Depressed, Hopeless 0 0 1 1 0  PHQ - 2 Score 0 1 2 2  0  Altered sleeping 3 3 1     Tired, decreased energy 2 3 1     Change in appetite 2 3 1     Feeling bad or failure about yourself  0 1 0    Trouble concentrating 1 1 1     Moving slowly or fidgety/restless 0 0 1    Suicidal thoughts 0 0 0    PHQ-9 Score 8 12 7     Difficult doing work/chores   Somewhat difficult          05/02/2023   10:32 AM 01/03/2023    1:27 PM  GAD 7 : Generalized Anxiety Score  Nervous, Anxious, on Edge 0 3  Control/stop worrying 0 3  Worry too much - different things 1 3  Trouble relaxing 1 2  Restless 0 1  Easily annoyed or irritable 0 1  Afraid - awful might happen 1 2  Total GAD 7 Score 3 15     Review of Systems:   Pertinent items are noted in HPI Denies cramping/contractions, leakage of fluid, vaginal bleeding, abnormal vaginal discharge w/ itching/odor/irritation, headaches, visual changes, shortness of breath, chest pain, abdominal pain, severe nausea/vomiting, or problems with urination or bowel movements unless otherwise stated above.  Pertinent History Reviewed:  Reviewed past  medical,surgical, social, obstetrical and family history.  Reviewed problem list, medications and allergies. OB History  Gravida Para Term Preterm AB Living  2 1 1     1   SAB IAB Ectopic Multiple Live Births        0 1    # Outcome Date GA Lbr Len/2nd Weight Sex Type Anes PTL Lv  2 Current           1 Term 06/16/18 [redacted]w[redacted]d  7 lb 0.7 oz (3.195 kg) M CS-LTranv EPI N LIV     Complications: Fetal Intolerance   Physical Assessment:   Vitals:   05/02/23 1019  BP: 107/70  Pulse: 94  Weight: 207 lb (93.9 kg)  Body mass index is 35.53 kg/m.       Physical Examination:  General appearance - well appearing, and in no distress  Mental status - alert, oriented to person, place, and time  Psych:  She has a normal mood and affect  Skin - warm and dry, normal color, no suspicious lesions noted  Chest - effort normal, all lung fields clear to auscultation bilaterally  Heart - normal rate and regular rhythm  Abdomen - soft,  nontender  Extremities:  No swelling or varicosities noted  Thin prep pap is not done   Chaperone: N/A    TODAY'S NT [place] 11+6 wks,measurements c/w dates,NB present,NT 1.5 mm,FHR 160 bpm,normal ovaries,CRL 60.75 mm   No results found for this or any previous visit (from the past 24 hour(s)).  Assessment & Plan:  1) Low-Risk Pregnancy G2P1001 at [redacted]w[redacted]d with an Estimated Date of Delivery: 11/15/23   2) Initial OB visit  3) PG BMI >=35- plan EFW 32w  4) Prev c/s> for NRFHR at 7cm during IOL, gave VBAC consent to review  5) H/O A1DM> A1C today  6) Low back/hip pain> gave printed prevention/relief measures   Meds:  Meds ordered this encounter  Medications   Blood Pressure Monitor MISC    Sig: For regular home bp monitoring during pregnancy    Dispense:  1 each    Refill:  0    Z34.81 Please mail to patient    Initial labs obtained Continue prenatal vitamins Reviewed n/v relief measures and warning s/s to report Reviewed recommended weight gain based on  pre-gravid BMI Encouraged well-balanced diet Genetic & carrier screening discussed: requests Panorama, NT/IT, and Horizon  Ultrasound discussed; fetal survey: requested CCNC completed> form faxed if has or is planning to apply for medicaid The nature of [employer] for [employer] with multiple MDs and other Advanced Practice Providers was explained to patient; also emphasized that fellows, residents, and students are part of our team. Does not have home bp cuff. Office bp cuff given: no. Rx sent: yes. Check bp weekly, let us know if consistently >140/90.  Flu shot given  Indications for ASA therapy (per uptodate) OR Two or more of the following: Obesity (BMI>30 kg/m2) Yes Sociodemographic characteristics (African American race, low socioeconomic level) Yes  Follow-up: Return in about 4 weeks (around 05/30/2023) for LROB, 2nd IT, CNM, in person; then 8wks from now anatomy u/s and LROB w/ CNM.   Orders Placed This Encounter  Procedures   Urine Culture   GC/Chlamydia Probe Amp   Integrated 1   PANORAMA PRENATAL TEST   Hemoglobin A1c   CBC/D/Plt+RPR+Rh+ABO+RubIgG...   [facility]    Cheral Marker CNM, [facility] 05/02/2023 11:27 AM

## 2023-05-02 NOTE — Patient Instructions (Addendum)
[place], thank you for choosing our office today! We appreciate the opportunity to meet your healthcare needs. You may receive a short survey by mail, e-mail, or through [employer]. If you are happy with your care we would appreciate if you could take just a few minutes to complete the survey questions. We read all of your comments and take your feedback very seriously. Thank you again for choosing our office.  Center for [employer] Team at [facility]  [facility] at [facility] ([address] [place], [place] [address]) Entrance C, located off of [address] valet parking   For your lower back/hip pain you may: Purchase a pregnancy/maternity support belt from [employer], [employer], [employer], [employer], etc and wear it while you are up and about Take warm baths Use a heating pad to your lower back for no longer than 20 minutes at a time, and do not place near abdomen Take tylenol as needed. Please follow directions on the bottle Kinesiology tape (can get from sporting goods store), google how to tape belly for pregnancy    Nausea & Vomiting Have saltine crackers or pretzels by your bed and eat a few bites before you raise your head out of bed in the morning Eat small frequent meals throughout the day instead of large meals Drink plenty of fluids throughout the day to stay hydrated, just don't drink a lot of fluids with your meals.  This can make your stomach fill up faster making you feel sick Do not brush your teeth right after you eat Products with real ginger are good for nausea, like ginger ale and ginger hard candy Make sure it says made with real ginger! Sucking on sour candy like lemon heads is also good for nausea If your prenatal vitamins make you nauseated, take them at night so you will sleep through the nausea Sea Bands If you feel like you need medicine for the nausea & vomiting please let us know If you are unable to keep any fluids or food down  please let us know   Constipation Drink plenty of fluid, preferably water, throughout the day Eat foods high in fiber such as fruits, vegetables, and grains Exercise, such as walking, is a good way to keep your bowels regular Drink warm fluids, especially warm prune juice, or decaf coffee Eat a 1/2 cup of real oatmeal (not instant), 1/2 cup applesauce, and 1/2-1 cup warm prune juice every day If needed, you may take Colace (docusate sodium) stool softener once or twice a day to help keep the stool soft.  If you still are having problems with constipation, you may take Miralax once daily as needed to help keep your bowels regular.   Home Blood Pressure Monitoring for Patients   Your provider has recommended that you check your blood pressure (BP) at least once a week at home. If you do not have a blood pressure cuff at home, one will be provided for you. Contact your provider if you have not received your monitor within 1 week.   Helpful Tips for Accurate Home Blood Pressure Checks  Don't smoke, exercise, or drink caffeine 30 minutes before checking your BP Use the restroom before checking your BP (a full bladder can raise your pressure) Relax in a comfortable upright chair Feet on the ground Left arm resting comfortably on a flat surface at the level of your heart Legs uncrossed Back supported Sit quietly and don't talk Place the cuff on  your bare arm Adjust snuggly, so that only two fingertips can fit between your skin and the top of the cuff Check 2 readings separated by at least one minute Keep a log of your BP readings For a visual, please reference this diagram: http://ccnc.care/bpdiagram  Provider Name: [facility]     Phone: [phone]  Zone 1: ALL CLEAR  Continue to monitor your symptoms:  BP reading is less than 140 (top number) or less than 90 (bottom number)  No right upper stomach pain No headaches or seeing spots No feeling nauseated or throwing up No  swelling in face and hands  Zone 2: CAUTION Call your doctor's office for any of the following:  BP reading is greater than 140 (top number) or greater than 90 (bottom number)  Stomach pain under your ribs in the middle or right side Headaches or seeing spots Feeling nauseated or throwing up Swelling in face and hands  Zone 3: EMERGENCY  Seek immediate medical care if you have any of the following:  BP reading is greater than160 (top number) or greater than 110 (bottom number) Severe headaches not improving with Tylenol Serious difficulty catching your breath Any worsening symptoms from Zone 2    First Trimester of Pregnancy The first trimester of pregnancy is from week 1 until the end of week 12 (months 1 through 3). A week after a sperm fertilizes an egg, the egg will implant on the wall of the uterus. This embryo will begin to develop into a baby. Genes from you and your partner are forming the baby. The female genes determine whether the baby is a boy or a girl. At 6-8 weeks, the eyes and face are formed, and the heartbeat can be seen on ultrasound. At the end of 12 weeks, all the baby's organs are formed.  Now that you are pregnant, you will want to do everything you can to have a healthy baby. Two of the most important things are to get good prenatal care and to follow your health care provider's instructions. Prenatal care is all the medical care you receive before the baby's birth. This care will help prevent, find, and treat any problems during the pregnancy and childbirth. BODY CHANGES Your body goes through many changes during pregnancy. The changes vary from woman to woman.  You may gain or lose a couple of pounds at first. You may feel sick to your stomach (nauseous) and throw up (vomit). If the vomiting is uncontrollable, call your health care provider. You may tire easily. You may develop headaches that can be relieved by medicines approved by your health care provider. You may  urinate more often. Painful urination may mean you have a bladder infection. You may develop heartburn as a result of your pregnancy. You may develop constipation because certain hormones are causing the muscles that push waste through your intestines to slow down. You may develop hemorrhoids or swollen, bulging veins (varicose veins). Your breasts may begin to grow larger and become tender. Your nipples may stick out more, and the tissue that surrounds them (areola) may become darker. Your gums may bleed and may be sensitive to brushing and flossing. Dark spots or blotches (chloasma, mask of pregnancy) may develop on your face. This will likely fade after the baby is born. Your menstrual periods will stop. You may have a loss of appetite. You may develop cravings for certain kinds of food. You may have changes in your emotions from day to day, such as  being excited to be pregnant or being concerned that something may go wrong with the pregnancy and baby. You may have more vivid and strange dreams. You may have changes in your hair. These can include thickening of your hair, rapid growth, and changes in texture. Some women also have hair loss during or after pregnancy, or hair that feels dry or thin. Your hair will most likely return to normal after your baby is born. WHAT TO EXPECT AT YOUR PRENATAL VISITS During a routine prenatal visit: You will be weighed to make sure you and the baby are growing normally. Your blood pressure will be taken. Your abdomen will be measured to track your baby's growth. The fetal heartbeat will be listened to starting around week 10 or 12 of your pregnancy. Test results from any previous visits will be discussed. Your health care provider may ask you: How you are feeling. If you are feeling the baby move. If you have had any abnormal symptoms, such as leaking fluid, bleeding, severe headaches, or abdominal cramping. If you have any questions. Other tests that  may be performed during your first trimester include: Blood tests to find your blood type and to check for the presence of any previous infections. They will also be used to check for low iron levels (anemia) and Rh antibodies. Later in the pregnancy, blood tests for diabetes will be done along with other tests if problems develop. Urine tests to check for infections, diabetes, or protein in the urine. An ultrasound to confirm the proper growth and development of the baby. An amniocentesis to check for possible genetic problems. Fetal screens for spina bifida and Down syndrome. You may need other tests to make sure you and the baby are doing well. HOME CARE INSTRUCTIONS  Medicines Follow your health care provider's instructions regarding medicine use. Specific medicines may be either safe or unsafe to take during pregnancy. Take your prenatal vitamins as directed. If you develop constipation, try taking a stool softener if your health care provider approves. Diet Eat regular, well-balanced meals. Choose a variety of foods, such as meat or vegetable-based protein, fish, milk and low-fat dairy products, vegetables, fruits, and whole grain breads and cereals. Your health care provider will help you determine the amount of weight gain that is right for you. Avoid raw meat and uncooked cheese. These carry germs that can cause birth defects in the baby. Eating four or five small meals rather than three large meals a day may help relieve nausea and vomiting. If you start to feel nauseous, eating a few soda crackers can be helpful. Drinking liquids between meals instead of during meals also seems to help nausea and vomiting. If you develop constipation, eat more high-fiber foods, such as fresh vegetables or fruit and whole grains. Drink enough fluids to keep your urine clear or pale yellow. Activity and Exercise Exercise only as directed by your health care provider. Exercising will help you: Control your  weight. Stay in shape. Be prepared for labor and delivery. Experiencing pain or cramping in the lower abdomen or low back is a good sign that you should stop exercising. Check with your health care provider before continuing normal exercises. Try to avoid standing for long periods of time. Move your legs often if you must stand in one place for a long time. Avoid heavy lifting. Wear low-heeled shoes, and practice good posture. You may continue to have sex unless your health care provider directs you otherwise. Relief of Pain or Discomfort  Wear a good support bra for breast tenderness.   Take warm sitz baths to soothe any pain or discomfort caused by hemorrhoids. Use hemorrhoid cream if your health care provider approves.   Rest with your legs elevated if you have leg cramps or low back pain. If you develop varicose veins in your legs, wear support hose. Elevate your feet for 15 minutes, 3-4 times a day. Limit salt in your diet. Prenatal Care Schedule your prenatal visits by the twelfth week of pregnancy. They are usually scheduled monthly at first, then more often in the last 2 months before delivery. Write down your questions. Take them to your prenatal visits. Keep all your prenatal visits as directed by your health care provider. Safety Wear your seat belt at all times when driving. Make a list of emergency phone numbers, including numbers for family, friends, the hospital, and police and fire departments. General Tips Ask your health care provider for a referral to a local prenatal education class. Begin classes no later than at the beginning of month 6 of your pregnancy. Ask for help if you have counseling or nutritional needs during pregnancy. Your health care provider can offer advice or refer you to specialists for help with various needs. Do not use hot tubs, steam rooms, or saunas. Do not douche or use tampons or scented sanitary pads. Do not cross your legs for long periods of  time. Avoid cat litter boxes and soil used by cats. These carry germs that can cause birth defects in the baby and possibly loss of the fetus by miscarriage or stillbirth. Avoid all smoking, herbs, alcohol, and medicines not prescribed by your health care provider. Chemicals in these affect the formation and growth of the baby. Schedule a dentist appointment. At home, brush your teeth with a soft toothbrush and be gentle when you floss. SEEK MEDICAL CARE IF:  You have dizziness. You have mild pelvic cramps, pelvic pressure, or nagging pain in the abdominal area. You have persistent nausea, vomiting, or diarrhea. You have a bad smelling vaginal discharge. You have pain with urination. You notice increased swelling in your face, hands, legs, or ankles. SEEK IMMEDIATE MEDICAL CARE IF:  You have a fever. You are leaking fluid from your vagina. You have spotting or bleeding from your vagina. You have severe abdominal cramping or pain. You have rapid weight gain or loss. You vomit blood or material that looks like coffee grounds. You are exposed to [place] measles and have never had them. You are exposed to fifth disease or chickenpox. You develop a severe headache. You have shortness of breath. You have any kind of trauma, such as from a fall or a car accident. Document Released: 05/03/2001 Document Revised: 09/23/2013 Document Reviewed: 03/19/2013 [facility] Patient Information 2015 [place], [place]. This information is not intended to replace advice given to you by your health care provider. Make sure you discuss any questions you have with your health care provider.

## 2023-05-03 LAB — INTEGRATED 1

## 2023-05-04 LAB — CBC/D/PLT+RPR+RH+ABO+RUBIGG...
Antibody Screen: NEGATIVE
Basophils Absolute: 0 10*3/uL (ref 0.0–0.2)
Basos: 0 %
EOS (ABSOLUTE): 0.1 10*3/uL (ref 0.0–0.4)
Eos: 1 %
HIV Screen 4th Generation wRfx: NONREACTIVE
Hematocrit: 39.3 % (ref 34.0–46.6)
Hemoglobin: 12.7 g/dL (ref 11.1–15.9)
Immature Grans (Abs): 0 10*3/uL (ref 0.0–0.1)
Immature Granulocytes: 1 %
Lymphocytes Absolute: 1.9 10*3/uL (ref 0.7–3.1)
Lymphs: 24 %
MCH: 27.4 pg (ref 26.6–33.0)
MCHC: 32.3 g/dL (ref 31.5–35.7)
MCV: 85 fL (ref 79–97)
Monocytes Absolute: 0.4 10*3/uL (ref 0.1–0.9)
Monocytes: 5 %
Neutrophils Absolute: 5.5 10*3/uL (ref 1.4–7.0)
Neutrophils: 69 %
Platelets: 162 10*3/uL (ref 150–450)
RBC: 4.63 x10E6/uL (ref 3.77–5.28)
RDW: 13.6 % (ref 11.7–15.4)
Rh Factor: POSITIVE
Rubella Antibodies, IGG: 1.09 {index} (ref >0.99)
WBC: 7.9 10*3/uL (ref 3.4–10.8)

## 2023-05-04 LAB — INTEGRATED 1
Crown Rump Length: 60.8 mm
Gest. Age on Collection Date: 12.4 wk
PAPP-A Value: 2447.6 ng/mL
Race: 1
Sonographer ID#: 27.7 a
Sonographer ID#: 309760
Weight: 1.5 mm
Weight: 207 [lb_av]

## 2023-05-04 LAB — HEMOGLOBIN A1C
Est. average glucose Bld gHb Est-mCnc: 108 mg/dL
Hgb A1c MFr Bld: 5.4 % (ref 4.8–5.6)

## 2023-05-04 LAB — GC/CHLAMYDIA PROBE AMP
Chlamydia trachomatis, NAA: NEGATIVE
Neisseria Gonorrhoeae by PCR: NEGATIVE

## 2023-05-04 LAB — URINE CULTURE

## 2023-05-04 LAB — HCV INTERPRETATION

## 2023-05-10 ENCOUNTER — Encounter: Payer: Self-pay | Admitting: Women's Health

## 2023-05-10 LAB — HORIZON CUSTOM: REPORT SUMMARY: NEGATIVE

## 2023-05-11 LAB — PANORAMA PRENATAL TEST FULL PANEL:PANORAMA TEST PLUS 5 ADDITIONAL MICRODELETIONS: FETAL FRACTION: 4

## 2023-05-12 ENCOUNTER — Other Ambulatory Visit: Payer: Self-pay

## 2023-05-12 ENCOUNTER — Emergency Department (HOSPITAL_BASED_OUTPATIENT_CLINIC_OR_DEPARTMENT_OTHER)
Admission: EM | Admit: 2023-05-12 | Discharge: 2023-05-13 | Disposition: A | Discharge status: Home / Self Care | Payer: Medicaid Other | Attending: Emergency Medicine | Admitting: Emergency Medicine

## 2023-05-12 ENCOUNTER — Encounter (HOSPITAL_BASED_OUTPATIENT_CLINIC_OR_DEPARTMENT_OTHER): Payer: Self-pay

## 2023-05-12 DIAGNOSIS — J029 Acute pharyngitis, unspecified: Secondary | ICD-10-CM

## 2023-05-12 DIAGNOSIS — Z7982 Long term (current) use of aspirin: Secondary | ICD-10-CM | POA: Insufficient documentation

## 2023-05-12 DIAGNOSIS — Z3A13 13 weeks gestation of pregnancy: Secondary | ICD-10-CM | POA: Diagnosis not present

## 2023-05-12 DIAGNOSIS — O219 Vomiting of pregnancy, unspecified: Secondary | ICD-10-CM | POA: Diagnosis not present

## 2023-05-12 DIAGNOSIS — R112 Nausea with vomiting, unspecified: Secondary | ICD-10-CM

## 2023-05-12 DIAGNOSIS — O99511 Diseases of the respiratory system complicating pregnancy, first trimester: Secondary | ICD-10-CM | POA: Insufficient documentation

## 2023-05-12 DIAGNOSIS — Z3A01 Less than 8 weeks gestation of pregnancy: Secondary | ICD-10-CM | POA: Diagnosis not present

## 2023-05-12 LAB — COMPREHENSIVE METABOLIC PANEL
ALT: 15 U/L (ref 0–44)
AST: 14 U/L — ABNORMAL LOW (ref 15–41)
Albumin: 3.5 g/dL (ref 3.5–5.0)
Alkaline Phosphatase: 86 U/L (ref 38–126)
Anion gap: 8 (ref 5–15)
BUN: 6 mg/dL (ref 6–20)
CO2: 23 mmol/L (ref 22–32)
Calcium: 9.2 mg/dL (ref 8.9–10.3)
Chloride: 104 mmol/L (ref 98–111)
Creatinine, Ser: 0.59 mg/dL (ref 0.44–1.00)
GFR, Estimated: >60 mL/min (ref >60)
Glucose, Bld: 96 mg/dL (ref 70–99)
Potassium: 4 mmol/L (ref 3.5–5.1)
Sodium: 135 mmol/L (ref 135–145)
Total Bilirubin: 0.3 mg/dL (ref <1.2)
Total Protein: 7 g/dL (ref 6.5–8.1)

## 2023-05-12 LAB — CBC WITH DIFFERENTIAL/PLATELET
Abs Immature Granulocytes: 0.07 10*3/uL (ref 0.00–0.07)
Basophils Absolute: 0 10*3/uL (ref 0.0–0.1)
Basophils Relative: 0 %
Eosinophils Absolute: 0.1 10*3/uL (ref 0.0–0.5)
Eosinophils Relative: 1 %
HCT: 34.7 % — ABNORMAL LOW (ref 36.0–46.0)
Hemoglobin: 11.9 g/dL — ABNORMAL LOW (ref 12.0–15.0)
Immature Granulocytes: 1 %
Lymphocytes Relative: 23 %
Lymphs Abs: 2.3 10*3/uL (ref 0.7–4.0)
MCH: 28.1 pg (ref 26.0–34.0)
MCHC: 34.3 g/dL (ref 30.0–36.0)
MCV: 81.8 fL (ref 80.0–100.0)
Monocytes Absolute: 0.6 10*3/uL (ref 0.1–1.0)
Monocytes Relative: 6 %
Neutro Abs: 6.8 10*3/uL (ref 1.7–7.7)
Neutrophils Relative %: 69 %
Platelets: 159 10*3/uL (ref 150–400)
RBC: 4.24 MIL/uL (ref 3.87–5.11)
RDW: 13.3 % (ref 11.5–15.5)
WBC: 9.9 10*3/uL (ref 4.0–10.5)
nRBC: 0 % (ref 0.0–0.2)

## 2023-05-12 LAB — GROUP A STREP BY PCR: Group A Strep by PCR: NOT DETECTED

## 2023-05-12 NOTE — ED Triage Notes (Signed)
Pt c/o "dark red" hematemesis x1 around dinner time, [redacted]wks pregnant. Denies abd pain, additional vomiting. States she started low dose ASA yesterday.    G2P1

## 2023-05-12 NOTE — ED Provider Notes (Signed)
[place] EMERGENCY DEPARTMENT AT [facility] Provider Note   CSN: [id number] Arrival date & time: 05/12/23  1912     History {Add pertinent medical, surgical, social history, OB history to HPI:1} Chief Complaint  Patient presents with   Hematemesis    Alison Sandoval is a 27 y.o. female.  The history is provided by the patient.  She is 13 weeks 3 days pregnant, G2, P1, and comes in because of an episode of vomiting blood at home.  She showed me a picture she took and it was some streaks of blood but not a significant quantity.  She is also complaining of a sore throat.  She has been having problems with heartburn and indigestion but denies abdominal pain.  She is also recently started on low-dose aspirin related to her pregnancy.   Home Medications Prior to Admission medications   Medication Sig Start Date End Date Taking? Authorizing Provider  aspirin EC 81 MG tablet Take 1 tablet (81 mg total) by mouth daily. Swallow whole. 05/02/23   Cheral Marker, CNM  Blood Pressure Monitor MISC For regular home bp monitoring during pregnancy 05/02/23   Cheral Marker, CNM  ondansetron (ZOFRAN) 4 MG tablet Take 1 tablet (4 mg total) by mouth every 8 (eight) hours as needed. 03/17/23   Adline Potter, NP  OVER THE COUNTER MEDICATION Nature made gummy prenatal vitamins    [provider]  senna (SENOKOT) 8.6 MG TABS tablet Take 1 tablet (8.6 mg total) by mouth 2 (two) times daily as needed for moderate constipation. 04/03/23   Hessie Dibble, MD      Allergies    Caramel    Review of Systems   Review of Systems  All other systems reviewed and are negative.   Physical Exam Updated Vital Signs BP 121/68 (BP Location: Left Arm)   Pulse 85   Temp 97.8 F (36.6 C)   Resp 18   LMP 02/08/2023 (Exact Date) Comment: Shorter than normal  SpO2 100%  Physical Exam Vitals and nursing note reviewed.   27 year old female, resting comfortably and in no acute  distress. Vital signs are normal. Oxygen saturation is 100%, which is normal. Head is normocephalic and atraumatic. PERRLA, EOMI. Oropharynx is clear.  There is a patch of exudate present on the right tonsil without significant erythema or tonsillar hypertrophy. Neck is nontender and supple without adenopathy . Lungs are clear without rales, wheezes, or rhonchi. Chest is nontender. Heart has regular rate and rhythm without murmur. Abdomen is soft, flat, nontender. Extremities have no cyanosis or edema, full range of motion is present. Skin is warm and dry without rash. Neurologic: Mental status is normal, moves all extremities equally.  ED Results / Procedures / Treatments   Labs (all labs ordered are listed, but only abnormal results are displayed) Labs Reviewed  COMPREHENSIVE METABOLIC PANEL - Abnormal; Notable for the following components:      Result Value   AST 14 (*)    All other components within normal limits  CBC WITH DIFFERENTIAL/PLATELET - Abnormal; Notable for the following components:   Hemoglobin 11.9 (*)    HCT 34.7 (*)    All other components within normal limits    EKG None  Radiology No results found.  Procedures Procedures  {Document cardiac monitor, telemetry assessment procedure when appropriate:1}  Medications Ordered in ED Medications - No data to display  ED Course/ Medical Decision Making/ A&P   {   Click here  for ABCD2, HEART and other calculatorsREFRESH Note before signing :1}                              Medical Decision Making Amount and/or Complexity of Data Reviewed Labs: ordered.   Hematemesis which does not seem to be a significant quantity of blood.  Sore throat with exudate present on the right tonsil which could be viral tonsillitis versus strep.  I have ordered strep PCR.  I have reviewed her laboratory tests, my interpretation is normal comprehensive metabolic panel, mild anemia which is not unexpected in pregnancy.  There has been  a drop in hemoglobin of 0.8 g compared with 05/02/2023.  However, on review of her past records, she did have significant anemia in 2019 and 2020.  Part of her anemia could be related to her pregnancy.  I have ordered a repeat hemoglobin to see if it is dropping further.  {Document critical care time when appropriate:1} {Document review of labs and clinical decision tools ie heart score, Chads2Vasc2 etc:1}  {Document your independent review of radiology images, and any outside records:1} {Document your discussion with family members, caretakers, and with consultants:1} {Document social determinants of health affecting pt's care:1} {Document your decision making why or why not admission, treatments were needed:1} Final Clinical Impression(s) / ED Diagnoses Final diagnoses:  None    Rx / DC Orders ED Discharge Orders     None

## 2023-05-13 LAB — HEMOGLOBIN AND HEMATOCRIT, BLOOD
HCT: 35.6 % — ABNORMAL LOW (ref 36.0–46.0)
Hemoglobin: 12.4 g/dL (ref 12.0–15.0)

## 2023-05-13 NOTE — Discharge Instructions (Addendum)
Take famotidine (Pepcid AC) once a day.  If you continue to have significant heartburn, you can increase it to twice a day.  If you continue to have symptoms in spite of taking famotidine twice a day, you would need to start taking a proton pump inhibitor such as omeprazole (Prilosec OTC) or esomeprazole (Nexium).  Drink plenty of fluids.  You may take acetaminophen as needed for pain.  Continue to use ondansetron as needed for nausea.

## 2023-05-24 NOTE — L&D Delivery Note (Addendum)
 OB/[facility]N Faculty Practice Delivery Note  Alison Sandoval is a 28 y.o. G2P1001 s/p VBAC via vacuum delivery at [redacted]w[redacted]d. She was admitted for IOL for GDMA2.   ROM: 17h 61m with clear fluid GBS Status: positive, received penicillin  Maximum Maternal Temperature: 98.4F  Labor Progress: Patient arrived to floor and was started on pitocin . SROM occurred. She then progressed to 9 cm and stayed there for several hours. She then requested a cesarean section; however upon arrival to OR, she was found to be complete and requested to pursue TOLAC again.  Delivery Date/Time: 11/09/23 0742 Delivery: Called to room and patient was complete and pushing. Maternal effort stalled at +4 station and was unable to effectively push due to distress. Consented for vacuum delivery. Risks and benefits discussed in detail.  Risks include, but are not limited to the risks of anesthesia, bleeding, infection, damage to maternal tissues, fetal cephalhematoma.  There is also the risk of inability to effect vaginal delivery of the head, or shoulder dystocia that cannot be resolved by established maneuvers, leading to the need for emergency cesarean section.  The Kiwi Omnicup was positioned over the sagittal suture 3 cm anterior to posterior fontanelle.  Pressure was then increased to 500 mmHg, and the patient was instructed to push.  Pulling was administered along the pelvic curve.  2 pulls were administered during one contractions.  No popoffs.  The infant was then delivered atraumatically in ROA position.  No nuchal cord present. Shoulder and body delivered in usual fashion. Infant with spontaneous cry, placed on mother's abdomen, dried and stimulated. Cord clamped x 2 after 1-minute delay, and cut by FOB. Cord blood drawn. Anesthesia called to bedside to re-dose epidural in order to tolerate repair and third stage. Placenta delivered spontaneously, intact, with 3-vessel cord. Fundus firm with massage and Pitocin . Labia, perineum,  vagina, and cervix inspected, 3A perineal laceration found and repaired in the usual fashion. Ancef  x1 given.  Placenta: 3-vessel cord, intact  Complications:  Lacerations: third degree perineal  EBL: 800 ml Analgesia: epidural  Infant: baby girl Zoey  APGARs 9&9  weight pending  Maud Sorenson, MD  11/09/23 9:08 AM

## 2023-06-02 ENCOUNTER — Encounter: Payer: Self-pay | Admitting: Advanced Practice Midwife

## 2023-06-02 ENCOUNTER — Ambulatory Visit (INDEPENDENT_AMBULATORY_CARE_PROVIDER_SITE_OTHER): Payer: Medicaid Other | Admitting: Advanced Practice Midwife

## 2023-06-02 VITALS — BP 119/80 | HR 96 | Wt 217.0 lb

## 2023-06-02 DIAGNOSIS — Z1379 Encounter for other screening for genetic and chromosomal anomalies: Secondary | ICD-10-CM

## 2023-06-02 DIAGNOSIS — Z3482 Encounter for supervision of other normal pregnancy, second trimester: Secondary | ICD-10-CM

## 2023-06-02 DIAGNOSIS — Z3480 Encounter for supervision of other normal pregnancy, unspecified trimester: Secondary | ICD-10-CM

## 2023-06-02 DIAGNOSIS — Z3A16 16 weeks gestation of pregnancy: Secondary | ICD-10-CM

## 2023-06-02 NOTE — Progress Notes (Signed)
   LOW-RISK PREGNANCY VISIT Patient name: Alison Sandoval MRN [id number]  Date of birth: 10/27/1995 Chief Complaint:   Routine Prenatal Visit (2nd IT)  History of Present Illness:   Alison Sandoval is a 28 y.o. G33P1001 female at [redacted]w[redacted]d with an Estimated Date of Delivery: 11/15/23 being seen today for ongoing management of a low-risk pregnancy.  Today she reports  some pelvic discomforts . Contractions: Not present.  .  Movement: Present. denies leaking of fluid. Review of Systems:   Pertinent items are noted in HPI Denies abnormal vaginal discharge w/ itching/odor/irritation, headaches, visual changes, shortness of breath, chest pain, abdominal pain, severe nausea/vomiting, or problems with urination or bowel movements unless otherwise stated above. Pertinent History Reviewed:  Reviewed past medical,surgical, social, obstetrical and family history.  Reviewed problem list, medications and allergies. Physical Assessment:   Vitals:   06/02/23 1045  BP: 119/80  Pulse: 96  Weight: 217 lb (98.4 kg)  Body mass index is 37.25 kg/m.        Physical Examination:   General appearance: Well appearing, and in no distress  Mental status: Alert, oriented to person, place, and time  Skin: Warm & dry  Cardiovascular: Normal heart rate noted  Respiratory: Normal respiratory effort, no distress  Abdomen: Soft, gravid, nontender  Pelvic: Cervical exam deferred         Extremities: Edema: None  Fetal Status: Fetal Heart Rate (bpm): 150s   Movement: Present    No results found for this or any previous visit (from the past 24 hours).  Assessment & Plan:  1) Low-risk pregnancy G2P1001 at [redacted]w[redacted]d with an Estimated Date of Delivery: 11/15/23   2) Pelvic discomfort, rec maternity support belt or K tape  3) Prev C/S, still considering options   Meds: No orders of the defined types were placed in this encounter.  Labs/procedures today: 2nd IT  Plan:  Continue routine obstetrical care   Reviewed: Preterm  labor symptoms and general obstetric precautions including but not limited to vaginal bleeding, contractions, leaking of fluid and fetal movement were reviewed in detail with the patient.  All questions were answered. Has home bp cuff. Check bp weekly, let us  know if >140/90.   Follow-up: Return for As scheduled. (Anatomy scan)  Orders Placed This Encounter  Procedures   INTEGRATED 2   Suzen JONETTA Gentry [facility] 06/02/2023 11:14 AM

## 2023-06-02 NOTE — Patient Instructions (Signed)
 Alanis, thank you for choosing our office today! We appreciate the opportunity to meet your healthcare needs. You may receive a short survey by mail, e-mail, or through [employer]. If you are happy with your care we would appreciate if you could take just a few minutes to complete the survey questions. We read all of your comments and take your feedback very seriously. Thank you again for choosing our office.  Center for [employer] Team at [facility] at [facility] ([address] [place], [place] [address]) Entrance C, located off of [address] Free 24/7 valet parking  Go to [email or web address] to register for FREE online childbirth classes  Call the office [phone]) or go to [facility] if: You begin to severe cramping Your water breaks.  Sometimes it is a big gush of fluid, sometimes it is just a trickle that keeps getting your panties wet or running down your legs You have vaginal bleeding.  It is normal to have a small amount of spotting if your cervix was checked.   [facility] Pediatricians/Family Doctors [facility]): [address] Luba BROCKS, [phone]           [facility]: [address], [phone]                [facility] Medicine [facility]): [address], [phone] (call to ask if accepting patients) [facility] Department: [address], [place], [phone]    [facility] Pediatricians/Family Doctors [facility]): [phone] [address], [phone] [facility]: [address] [place], [phone] [facility]: [address], [phone]  [facility] Doctors  [facility]): [phone] [facility]: [address], [phone]   [facility] Doctors [facility]: [address] [phone]  [facility] Doctors  [facility]: [phone], [phone]  Home Blood Pressure Monitoring for Patients   Your provider has recommended that you check your blood pressure (BP) at least once a week at home. If you do not have a blood pressure cuff at home, one will be provided for you. Contact your provider if you have not received your monitor within 1 week.   Helpful Tips for Accurate Home Blood Pressure Checks  Don't smoke, exercise, or drink caffeine 30 minutes before checking your BP Use the restroom before checking your BP (a full bladder can raise your pressure) Relax in a comfortable upright chair Feet on the ground Left arm resting comfortably on a flat surface at the level of your heart Legs uncrossed Back supported Sit quietly and don't talk Place the cuff on your bare arm Adjust snuggly, so that only two fingertips can fit between your skin and the top of the cuff Check 2 readings separated by at least one minute Keep a log of your BP readings For a visual, please reference this diagram: http://ccnc.care/bpdiagram  Provider Name: [facility]     Phone: [phone]  Zone 1: ALL CLEAR  Continue to monitor your symptoms:  BP reading is less than 140 (top number) or less than 90 (bottom number)  No right upper stomach pain No headaches or seeing spots No feeling nauseated or throwing up No swelling in face and hands  Zone 2: CAUTION Call your doctor's office for any of the following:  BP reading is greater than 140 (top number) or greater than  90 (bottom number)  Stomach pain under your ribs in the middle or right side Headaches or seeing spots Feeling nauseated or throwing up Swelling in face and hands  Zone 3: EMERGENCY  Seek immediate medical care if you have any of the following:  BP reading is greater than160 (top number) or greater than 110 (bottom number) Severe headaches not improving with Tylenol  Serious difficulty catching your breath Any worsening symptoms from  Zone 2     Second Trimester of Pregnancy The second trimester is from week 14 through week 27 (months 4 through 6). The second trimester is often a time when you feel your best. Your body has adjusted to being pregnant, and you begin to feel better physically. Usually, morning sickness has lessened or quit completely, you may have more energy, and you may have an increase in appetite. The second trimester is also a time when the fetus is growing rapidly. At the end of the sixth month, the fetus is about 9 inches long and weighs about 1 pounds. You will likely begin to feel the baby move (quickening) between 16 and 20 weeks of pregnancy. Body changes during your second trimester Your body continues to go through many changes during your second trimester. The changes vary from woman to woman. Your weight will continue to increase. You will notice your lower abdomen bulging out. You may begin to get stretch marks on your hips, abdomen, and breasts. You may develop headaches that can be relieved by medicines. The medicines should be approved by your health care provider. You may urinate more often because the fetus is pressing on your bladder. You may develop or continue to have heartburn as a result of your pregnancy. You may develop constipation because certain hormones are causing the muscles that push waste through your intestines to slow down. You may develop hemorrhoids or swollen, bulging veins (varicose veins). You may have back pain. This is caused by: Weight gain. Pregnancy hormones that are relaxing the joints in your pelvis. A shift in weight and the muscles that support your balance. Your breasts will continue to grow and they will continue to become tender. Your gums may bleed and may be sensitive to brushing and flossing. Dark spots or blotches (chloasma, mask of pregnancy) may develop on your face. This will likely fade after the baby is born. A dark line from your belly button to  the pubic area (linea nigra) may appear. This will likely fade after the baby is born. You may have changes in your hair. These can include thickening of your hair, rapid growth, and changes in texture. Some women also have hair loss during or after pregnancy, or hair that feels dry or thin. Your hair will most likely return to normal after your baby is born.  What to expect at prenatal visits During a routine prenatal visit: You will be weighed to make sure you and the fetus are growing normally. Your blood pressure will be taken. Your abdomen will be measured to track your baby's growth. The fetal heartbeat will be listened to. Any test results from the previous visit will be discussed.  Your health care provider may ask you: How you are feeling. If you are feeling the baby move. If you have had any abnormal symptoms, such as leaking fluid, bleeding, severe headaches, or abdominal cramping. If you are using any tobacco products, including cigarettes, chewing tobacco, and electronic cigarettes. If you have any questions.  Other tests that may be performed during  your second trimester include: Blood tests that check for: Low iron levels (anemia). High blood sugar that affects pregnant women (gestational diabetes) between 58 and 28 weeks. Rh antibodies. This is to check for a protein on red blood cells (Rh factor). Urine tests to check for infections, diabetes, or protein in the urine. An ultrasound to confirm the proper growth and development of the baby. An amniocentesis to check for possible genetic problems. Fetal screens for spina bifida and Down syndrome. HIV (human immunodeficiency virus) testing. Routine prenatal testing includes screening for HIV, unless you choose not to have this test.  Follow these instructions at home: Medicines Follow your health care provider's instructions regarding medicine use. Specific medicines may be either safe or unsafe to take during  pregnancy. Take a prenatal vitamin that contains at least 600 micrograms (mcg) of folic acid. If you develop constipation, try taking a stool softener if your health care provider approves. Eating and drinking Eat a balanced diet that includes fresh fruits and vegetables, whole grains, good sources of protein such as meat, eggs, or tofu, and low-fat dairy. Your health care provider will help you determine the amount of weight gain that is right for you. Avoid raw meat and uncooked cheese. These carry germs that can cause birth defects in the baby. If you have low calcium intake from food, talk to your health care provider about whether you should take a daily calcium supplement. Limit foods that are high in fat and processed sugars, such as fried and sweet foods. To prevent constipation: Drink enough fluid to keep your urine clear or pale yellow. Eat foods that are high in fiber, such as fresh fruits and vegetables, whole grains, and beans. Activity Exercise only as directed by your health care provider. Most women can continue their usual exercise routine during pregnancy. Try to exercise for 30 minutes at least 5 days a week. Stop exercising if you experience uterine contractions. Avoid heavy lifting, wear low heel shoes, and practice good posture. A sexual relationship may be continued unless your health care provider directs you otherwise. Relieving pain and discomfort Wear a good support bra to prevent discomfort from breast tenderness. Take warm sitz baths to soothe any pain or discomfort caused by hemorrhoids. Use hemorrhoid cream if your health care provider approves. Rest with your legs elevated if you have leg cramps or low back pain. If you develop varicose veins, wear support hose. Elevate your feet for 15 minutes, 3-4 times a day. Limit salt in your diet. Prenatal Care Write down your questions. Take them to your prenatal visits. Keep all your prenatal visits as told by your health  care provider. This is important. Safety Wear your seat belt at all times when driving. Make a list of emergency phone numbers, including numbers for family, friends, the hospital, and police and fire departments. General instructions Ask your health care provider for a referral to a local prenatal education class. Begin classes no later than the beginning of month 6 of your pregnancy. Ask for help if you have counseling or nutritional needs during pregnancy. Your health care provider can offer advice or refer you to specialists for help with various needs. Do not use hot tubs, steam rooms, or saunas. Do not douche or use tampons or scented sanitary pads. Do not cross your legs for long periods of time. Avoid cat litter boxes and soil used by cats. These carry germs that can cause birth defects in the baby and possibly loss of the  fetus by miscarriage or stillbirth. Avoid all smoking, herbs, alcohol, and unprescribed drugs. Chemicals in these products can affect the formation and growth of the baby. Do not use any products that contain nicotine or tobacco, such as cigarettes and e-cigarettes. If you need help quitting, ask your health care provider. Visit your dentist if you have not gone yet during your pregnancy. Use a soft toothbrush to brush your teeth and be gentle when you floss. Contact a health care provider if: You have dizziness. You have mild pelvic cramps, pelvic pressure, or nagging pain in the abdominal area. You have persistent nausea, vomiting, or diarrhea. You have a bad smelling vaginal discharge. You have pain when you urinate. Get help right away if: You have a fever. You are leaking fluid from your vagina. You have spotting or bleeding from your vagina. You have severe abdominal cramping or pain. You have rapid weight gain or weight loss. You have shortness of breath with chest pain. You notice sudden or extreme swelling of your face, hands, ankles, feet, or legs. You  have not felt your baby move in over an hour. You have severe headaches that do not go away when you take medicine. You have vision changes. Summary The second trimester is from week 14 through week 27 (months 4 through 6). It is also a time when the fetus is growing rapidly. Your body goes through many changes during pregnancy. The changes vary from woman to woman. Avoid all smoking, herbs, alcohol, and unprescribed drugs. These chemicals affect the formation and growth your baby. Do not use any tobacco products, such as cigarettes, chewing tobacco, and e-cigarettes. If you need help quitting, ask your health care provider. Contact your health care provider if you have any questions. Keep all prenatal visits as told by your health care provider. This is important. This information is not intended to replace advice given to you by your health care provider. Make sure you discuss any questions you have with your health care provider. Document Released: 05/03/2001 Document Revised: 10/15/2015 Document Reviewed: 07/10/2012 Elsevier Interactive Patient Education  2017 [employer].

## 2023-06-06 ENCOUNTER — Encounter: Payer: Self-pay | Admitting: Advanced Practice Midwife

## 2023-06-06 LAB — INTEGRATED 2
AFP MoM: 1.24
Alpha-Fetoprotein: 35.6 ng/mL
Crown Rump Length: 60.8 mm
DIA MoM: 1
DIA Value: 130.5 pg/mL
Estriol, Unconjugated: 0.58 ng/mL
Gest. Age on Collection Date: 12.4 wk
Gestational Age: 16.9 wk
Maternal Age at EDD: 27.7 a
Nuchal Translucency (NT): 1.5 mm
Nuchal Translucency MoM: 0.88
Number of Fetuses: 1
PAPP-A MoM: 3.73
PAPP-A Value: 2447.6 ng/mL
Sonographer ID#: 309760
Weight: 207 [lb_av]
Weight: 217 [lb_av]
hCG MoM: 1.44
hCG Value: 33.6 [IU]/mL
uE3 MoM: 0.64

## 2023-06-29 ENCOUNTER — Other Ambulatory Visit: Payer: Self-pay | Admitting: Obstetrics & Gynecology

## 2023-06-29 DIAGNOSIS — Z363 Encounter for antenatal screening for malformations: Secondary | ICD-10-CM

## 2023-06-30 ENCOUNTER — Other Ambulatory Visit: Payer: Medicaid Other

## 2023-06-30 ENCOUNTER — Encounter: Payer: Medicaid Other | Admitting: Obstetrics & Gynecology

## 2023-07-11 ENCOUNTER — Other Ambulatory Visit: Payer: Self-pay | Admitting: Obstetrics & Gynecology

## 2023-07-11 DIAGNOSIS — O34219 Maternal care for unspecified type scar from previous cesarean delivery: Secondary | ICD-10-CM

## 2023-07-11 DIAGNOSIS — Z363 Encounter for antenatal screening for malformations: Secondary | ICD-10-CM

## 2023-07-11 DIAGNOSIS — O99210 Obesity complicating pregnancy, unspecified trimester: Secondary | ICD-10-CM

## 2023-07-13 ENCOUNTER — Ambulatory Visit (INDEPENDENT_AMBULATORY_CARE_PROVIDER_SITE_OTHER): Payer: Medicaid Other | Admitting: Obstetrics & Gynecology

## 2023-07-13 ENCOUNTER — Encounter: Payer: Self-pay | Admitting: Obstetrics & Gynecology

## 2023-07-13 ENCOUNTER — Ambulatory Visit: Payer: Medicaid Other | Admitting: Radiology

## 2023-07-13 VITALS — BP 112/70 | HR 87 | Wt 218.0 lb

## 2023-07-13 DIAGNOSIS — Z3482 Encounter for supervision of other normal pregnancy, second trimester: Secondary | ICD-10-CM | POA: Diagnosis not present

## 2023-07-13 DIAGNOSIS — O34219 Maternal care for unspecified type scar from previous cesarean delivery: Secondary | ICD-10-CM | POA: Diagnosis not present

## 2023-07-13 DIAGNOSIS — O99212 Obesity complicating pregnancy, second trimester: Secondary | ICD-10-CM

## 2023-07-13 DIAGNOSIS — O99210 Obesity complicating pregnancy, unspecified trimester: Secondary | ICD-10-CM

## 2023-07-13 DIAGNOSIS — Z3A22 22 weeks gestation of pregnancy: Secondary | ICD-10-CM

## 2023-07-13 DIAGNOSIS — Z3480 Encounter for supervision of other normal pregnancy, unspecified trimester: Secondary | ICD-10-CM

## 2023-07-13 DIAGNOSIS — Z363 Encounter for antenatal screening for malformations: Secondary | ICD-10-CM

## 2023-07-13 NOTE — Progress Notes (Signed)
   LOW-RISK PREGNANCY VISIT Patient name: Alison Sandoval MRN [id number]  Date of birth: 11/09/1995 Chief Complaint:   Routine Prenatal Visit  History of Present Illness:   Haivyn Oravec is a 28 y.o. G89P1001 female at [redacted]w[redacted]d with an Estimated Date of Delivery: 11/15/23 being seen today for ongoing management of a low-risk pregnancy.     05/02/2023   10:32 AM 01/03/2023    1:27 PM 04/16/2018    4:14 PM 04/16/2018    4:13 PM 04/06/2018   10:34 AM  Depression screen PHQ 2/9  Decreased Interest 0 1 1 1  0  Down, Depressed, Hopeless 0 0 1 1 0  PHQ - 2 Score 0 1 2 2  0  Altered sleeping 3 3 1     Tired, decreased energy 2 3 1     Change in appetite 2 3 1     Feeling bad or failure about yourself  0 1 0    Trouble concentrating 1 1 1     Moving slowly or fidgety/restless 0 0 1    Suicidal thoughts 0 0 0    PHQ-9 Score 8 12 7     Difficult doing work/chores   Somewhat difficult      Today she reports backache and pelvic pressure. Contractions: Not present. Vag. Bleeding: None.  Movement: Present. denies leaking of fluid. Review of Systems:   Pertinent items are noted in HPI Denies abnormal vaginal discharge w/ itching/odor/irritation, headaches, visual changes, shortness of breath, chest pain, abdominal pain, severe nausea/vomiting, or problems with urination or bowel movements unless otherwise stated above. Pertinent History Reviewed:  Reviewed past medical,surgical, social, obstetrical and family history.  Reviewed problem list, medications and allergies. Physical Assessment:   Vitals:   07/13/23 1138  BP: 112/70  Pulse: 87  Weight: 218 lb (98.9 kg)  Body mass index is 37.42 kg/m.        Physical Examination:   General appearance: Well appearing, and in no distress  Mental status: Alert, oriented to person, place, and time  Skin: Warm & dry  Cardiovascular: Normal heart rate noted  Respiratory: Normal respiratory effort, no distress  Abdomen: Soft, gravid, nontender  Pelvic:  Cervical exam deferred         Extremities: Edema: None  Fetal Status:     Movement: Present    Chaperone: n/a    No results found for this or any previous visit (from the past 24 hours).  Assessment & Plan:  1) Low-risk pregnancy G2P1001 at [redacted]w[redacted]d with an Estimated Date of Delivery: 11/15/23   2) MS pain from pregnancy,   Repeat cardiac views in 4 weeks   Meds: No orders of the defined types were placed in this encounter.  Labs/procedures today: sonogram  Plan:  Continue routine obstetrical care  Next visit: prefers in person      Follow-up: Return in about 4 weeks (around 08/10/2023) for repeat sonogram for cardiac views, PN2.  No orders of the defined types were placed in this encounter.   Lazaro Arms, MD 07/13/2023 12:09 PM

## 2023-07-13 NOTE — Progress Notes (Signed)
[place]:  GA = 22+1 weeks Single active female fetus,  breech, balled up,  FHR = 147 bpm Unable to complete cardiac screen due to fetal position   Need all heart views No apparent abn but decreased resolution Posterior pl gr1  Normal amn fluid volume  MVP = 5.7 cm   EFW 81% 550g Normal ov's  -  CL = 3.7 cm, closed

## 2023-08-09 ENCOUNTER — Encounter: Payer: Self-pay | Admitting: Obstetrics & Gynecology

## 2023-08-14 ENCOUNTER — Other Ambulatory Visit: Payer: Self-pay | Admitting: Obstetrics & Gynecology

## 2023-08-14 DIAGNOSIS — Z363 Encounter for antenatal screening for malformations: Secondary | ICD-10-CM

## 2023-08-14 DIAGNOSIS — O34219 Maternal care for unspecified type scar from previous cesarean delivery: Secondary | ICD-10-CM

## 2023-08-14 DIAGNOSIS — O99210 Obesity complicating pregnancy, unspecified trimester: Secondary | ICD-10-CM

## 2023-08-14 DIAGNOSIS — O09292 Supervision of pregnancy with other poor reproductive or obstetric history, second trimester: Secondary | ICD-10-CM

## 2023-08-15 ENCOUNTER — Ambulatory Visit (INDEPENDENT_AMBULATORY_CARE_PROVIDER_SITE_OTHER): Payer: Medicaid Other | Admitting: Obstetrics and Gynecology

## 2023-08-15 ENCOUNTER — Other Ambulatory Visit: Payer: Medicaid Other

## 2023-08-15 ENCOUNTER — Encounter: Payer: Self-pay | Admitting: Obstetrics and Gynecology

## 2023-08-15 ENCOUNTER — Ambulatory Visit (INDEPENDENT_AMBULATORY_CARE_PROVIDER_SITE_OTHER): Payer: Medicaid Other | Admitting: Radiology

## 2023-08-15 VITALS — BP 107/73 | HR 97 | Wt 226.0 lb

## 2023-08-15 DIAGNOSIS — O99210 Obesity complicating pregnancy, unspecified trimester: Secondary | ICD-10-CM

## 2023-08-15 DIAGNOSIS — O99212 Obesity complicating pregnancy, second trimester: Secondary | ICD-10-CM | POA: Diagnosis not present

## 2023-08-15 DIAGNOSIS — Z3482 Encounter for supervision of other normal pregnancy, second trimester: Secondary | ICD-10-CM

## 2023-08-15 DIAGNOSIS — Z3A26 26 weeks gestation of pregnancy: Secondary | ICD-10-CM

## 2023-08-15 DIAGNOSIS — O09292 Supervision of pregnancy with other poor reproductive or obstetric history, second trimester: Secondary | ICD-10-CM | POA: Diagnosis not present

## 2023-08-15 DIAGNOSIS — O34219 Maternal care for unspecified type scar from previous cesarean delivery: Secondary | ICD-10-CM | POA: Diagnosis not present

## 2023-08-15 DIAGNOSIS — O09299 Supervision of pregnancy with other poor reproductive or obstetric history, unspecified trimester: Secondary | ICD-10-CM

## 2023-08-15 DIAGNOSIS — Z8632 Personal history of gestational diabetes: Secondary | ICD-10-CM

## 2023-08-15 DIAGNOSIS — Z98891 History of uterine scar from previous surgery: Secondary | ICD-10-CM

## 2023-08-15 DIAGNOSIS — Z131 Encounter for screening for diabetes mellitus: Secondary | ICD-10-CM

## 2023-08-15 DIAGNOSIS — Z363 Encounter for antenatal screening for malformations: Secondary | ICD-10-CM

## 2023-08-15 DIAGNOSIS — Z3480 Encounter for supervision of other normal pregnancy, unspecified trimester: Secondary | ICD-10-CM

## 2023-08-15 NOTE — Progress Notes (Signed)
   PRENATAL VISIT NOTE  Subjective:  Alison Sandoval is a 28 y.o. G2P1001 at [redacted]w[redacted]d being seen today for ongoing prenatal care.  She is currently monitored for the following issues for this low-risk pregnancy and has Postpartum depression/anxiety; History of gestational diabetes in prior pregnancy, currently pregnant; Encounter for supervision of normal pregnancy, antepartum; and Previous cesarean section on their problem list.  Patient reports no complaints.  Contractions: Not present.  .  Movement: Present. Denies leaking of fluid.   The following portions of the patient's history were reviewed and updated as appropriate: allergies, current medications, past family history, past medical history, past social history, past surgical history and problem list.   Objective:   Vitals:   08/15/23 1006  BP: 107/73  Pulse: 97  Weight: 226 lb (102.5 kg)    Fetal Status:     Movement: Present     General:  Alert, oriented and cooperative. Patient is in no acute distress.  Skin: Skin is warm and dry. No rash noted.   Cardiovascular: Normal heart rate noted  Respiratory: Normal respiratory effort, no problems with respiration noted  Abdomen: Soft, gravid, appropriate for gestational age.  Pain/Pressure: Absent     Pelvic: Cervical exam deferred        Extremities: Normal range of motion.  Edema: None  Mental Status: Normal mood and affect. Normal behavior. Normal judgment and thought content.   Assessment and Plan:  Pregnancy: G2P1001 at [redacted]w[redacted]d 1. Supervision of other normal pregnancy, antepartum (Primary)   2. [redacted] weeks gestation of pregnancy U/s today EFW 48%, afi 34% List given today for peds in [place]  3. History of gestational diabetes in prior pregnancy, currently pregnant PN2 today  4. History of cesarean section Has been leaning towards RCS   Preterm labor symptoms and general obstetric precautions including but not limited to vaginal bleeding, contractions, leaking of fluid  and fetal movement were reviewed in detail with the patient. Please refer to After Visit Summary for other counseling recommendations.   Return in about 2 weeks (around 08/29/2023), or LOB.   Albertine Grates, FNP

## 2023-08-15 NOTE — Progress Notes (Signed)
 [place]:  GA = 26+6 weeks Single active female fetus, cephalic, FHR = 147 bpm, AFI = 14.2 cm,  45%, MVP = 4.1 cm Posterior pl, gr1, EFW 48%, 1030g, normal 4 chamber view of heart but unable to obtain views of LVOT/RVOT/3VV/3VTV due to fetal position and shadowing from spine/ribs Cervix long and closed

## 2023-08-15 NOTE — Patient Instructions (Signed)
 [facility] Pediatric Providers  Central/Southeast [place] ([id number])  [facility] - [facility], MD; Manson Passey, MD; Ave Filter, MD; Luna Fuse, MD; Kennedy Bucker, MD; Florestine Avers, MD; Melchor Amour, MD; Yetta Barre,  MD; Konrad Dolores, MD; Kathlene November, MD; Jenne Campus, MD; Wynetta Emery, MD; Duffy Rhody, MD; Gerre Couch, NP [address] [place], [place], [place] [address] [phone] Mon, Tue, Thur, Fri 8:30-5:30, Wed 9:30-5:30, Sat 8:30-12:30 Only accepting infants of first-time parents or siblings of current patients Hospital discharge coordinator will make follow-up appointment Medicaid - yes; Tricare - yes   [facility] - Pediatrics at Elige Radon, MD; Sabino Dick, MD; Quitman Livings, MD; Betha Loa, NP; Claretha Cooper, MD; Lelon Perla, MD [address] [place], [place] [address] [phone] Mon-Fri 8:30-5:30 Medicaid - yes, Tricare - yes  [place] [phone]) [facility], MD [address], [place], [place] [address] [phone] Mon, Tues, Wed Fri 8:30-5:00, Sat 8:30-12:00, Closed Thursdays Accepting siblings of established patients and first time mom's if you call prenatally Medicaid- yes; Tricare - yes   [facility] [address], [place], [place] [address] [phone] Mon-Fri 8:30-5:00 (lunch 12:00-1:00) Medicaid -Yes; Tricare - Yes   [facility], MD; [place], [place]; [place], [place]; [place], [place] [address] [place] [address] [phone] Mon-Fri 7:30-5:30 Medicaid - Yes; Sherolyn Buba  Prichard [phone] & [phone])  [facility], MD [address] [place], [place] [address] [phone] Mon-Thur 8:00-6:00, closed for lunch 12-2, closed Fridays Medicaid - yes; Tricare - no  [facility] Dareen Piano, NP; Cyndia Bent, MD; [place], [place]; [place], [place] [address], [place],  [place] [address] [phone] Mon-Fri 7:30-4:30 Medicaid - yes, Tricare - yes  [facility]  Juanito Doom, MD; Janene Harvey, NP; Vonita Moss, MD; Donn Pierini, NP [address], [place], [place] [address] [phone] Mon-Fri 8:30-5:00, closed for lunch 1-2, Sat 8:30-12:00 - sick visits only Providers come to see babies at [facility] Only accepting newborns of siblings and first time parents ONLY if who have met with office prior to delivery Medicaid -Yes; Tricare - yes  [facility] - [place], [place]; Spero Geralds, NP; Earlene Plater, MD; Lucretia Roers, MD:  [address], [place], [place] [address] [phone] Mon- Fri 8:00-5:00, Sat 9:00-12:00 - sick visits only Accepting siblings of established patients and first time mom/baby Medicaid - Yes; Tricare - yes Patients must have vaccinations (baby vaccines)   [employer] [phone])  [facility] Alfredo Bach, PA; [place], [place]; Eddie Candle, MD; Normand Sloop, MD; [place], NP; Isenhour, DO; [place], [place]; Constance Goltz, MD; Ruthann Cancer, MD; Vear Clock, MD; [place], [place]; [place], [place]; [place], [place] [facility] [address], [place], [place] [address] [phone] Mon-Fri 8:30-5:00, Sat 9:00-12:00 - sick only Please register online [email or web address] then schedule online or call office Medicaid-Yes; Tricare -yes   [facility] - [facility] at [place] [facility] [place] [place], [place]; List, FNP; Berneda Rose, MD; Luther Redo, PA-C; Lavonia Drafts, MD; Kellie Simmering, FNP; Genevie Cheshire, FNP; Evaristo Bury, MD; Berneda Rose, MD [phone] [address], [place], [place] [address] [phone] Mon-Fri 8:30-5:30 Medicaid - Yes; Tricare - yes  [facility] - [address]  [place], [place]; Whitney Post, MD; Hennie Duos, MD; Wynne Dust, MD; Aquilla, NP [address], [place], [place] [address] [phone] Mon-Thur 8:00-5:30, Fri 8:00-5:00, Sat 9:00-12:00 Medicaid - yes, Tricare - yes [facility]  Central/Southeast  [place] [phone])  [facility] [address] [place], [place] [address] [phone] Mon-Fri 8:30-12:30, 1:30-5:00 Accepting [facility] Family Medicine at [facility] [address], [place], [place] [address] [phone] Mon-Fri 8:00-5:30 [facility] [address] [place], [place] [address] [phone] Farris Has, Thur, Fri 8:30-5:00, Wed 10:00-7:00 (closed 1-2pm) Accepting [facility] [address], [place], [place]  [address] Phone - [phone]   Fax - [phone]  [place] [phone]) [facility] [address], [place], [place] [address] [phone] Mon-Fri 8:00-5:00 [facility] - Pediatrics at [facility])  [address] Sherian Maroon [place], [place] [address] [phone] Mon-Fri 8:30-5:30, Sat (Oct.-Mar.) 9:00-1:00 Accepting Medicaid  [place] [phone]) [facility] Family Medicine at [facility] [address], [place], [place] [address] [phone] Mon-Fri 8:00-5:00  [place] [phone]) [facility] Medicine at [facility] [address], [place], [place] [address] [phone] Mon-Fri 8:00-5:00 [facility] Eugenio Saenz [address] [place], [place], [place] [address] [phone] Mon-Fri 8:00-5:00 [facility] [address] [place], [place] [address] [phone] Mon-Fri 8:00-5:00 [facility] [address] [place] [address] [phone] Mon-Fri 7:30-5:30  [place] [phone] & [phone]) [facility] [address] [place], [place] [address] [phone] Mon-Thur 8:00-6:00 Accepting [facility] Medicine [address] [place], [place] [address] [phone] Mon-Thur 7:30-7:30, Fri 7:30-4:30 Accepting [facility] Family Medicine at [facility] [address], [place], [place]  [address] [phone]   Fax - [phone]  [place] [phone] & [phone]) Adult nurse HealthCare at [facility] [address] [place], [place] [address] [phone] Mon-Fri 7:00-5:00 [facility] [address], [place], [place] [address] [phone] Mon-Fri 8:00-5:00 Accepting Medicaid [facility] - [facility] [address] [place], [place], [place] [address] [phone] Mon-Fri 8:00-5:00 Accepting Medicaid  [place] [phone]) [facility] Primary Care at [facility] [address] [place], [place] [address] [phone] Mon-Fri 8:00-5:00 [facility] Family [facility] - [facility] [place] at [facility]) [address], [place], [place] [address] [phone] Mon-Fri 8:00-5:00 Accepting Medicaid [facility] - [facility] [employer]) [address], [place], [place] [address] [phone] Mon-Fri 8:00-5:30, Sat&Sun by appointment (phones open at 8:30) Accepting [facility] [phone] & [phone]) [facility] [address], [place], [place] [address] [phone] Mon-Thur 8:00-7:00, Fri 8:00-5:00, Sat 8:00-12:00, Sun 9:00-12:00 Accepting Medicaid [facility] - Family Medicine at [facility] [address] [place], [place] [address] [phone] Mon-Thur 8:00-5:00 Accepting Medicaid [facility] at [place] [address] Sherian Maroon [place], [place] [address] [phone] Mon-Fri 8:00-5:30, Sat (Oct.-Mar.) 9:00-1:00 Accepting [facility]  [place] [phone]) [facility] [address] Delfin Edis [place], [place] [address] [phone] Mon-Fri 8:00-5:00 Accepting [facility]   [place] [phone]) [facility] at [facility] [address], [place], [place]  [address] [phone] Mon-Fri 8:00-5:00 [facility] at [facility] [address] Clint Lipps [place], [place] [address] [phone] Mon-Fri 8:00-5:00 [facility] - [place] [address], [place], [place] [address] [phone] Mon-Fri 8:00-5:00 After hours clinic [facility] [place], [place] [address]) [phone] Mon-Fri 5:00-8:00, Sat 12:00-6:00, Sun 10:00-4:00 Accepting Medicaid [facility] at [facility]. [address] [place], [place]  [address] [phone]  Fax - [phone]  [place] [phone]) Adult nurse HealthCare at [facility] [address], [place], [place] [address] [phone] Mon-Fri 8:00-5:00 [facility] Family Medicine - [place] [facility]) [address] [place] [address], [place], [place] [address] [phone] Mon-Thur 8:00-7:00, Fri 8:00-5:00, Sat 8:00-12:00

## 2023-08-16 ENCOUNTER — Encounter: Payer: Self-pay | Admitting: Obstetrics & Gynecology

## 2023-08-16 LAB — CBC
Hematocrit: 31.4 % — ABNORMAL LOW (ref 34.0–46.6)
Hemoglobin: 10.2 g/dL — ABNORMAL LOW (ref 11.1–15.9)
MCH: 26.3 pg — ABNORMAL LOW (ref 26.6–33.0)
MCHC: 32.5 g/dL (ref 31.5–35.7)
MCV: 81 fL (ref 79–97)
Platelets: 213 10*3/uL (ref 150–450)
RBC: 3.88 x10E6/uL (ref 3.77–5.28)
RDW: 14.2 % (ref 11.7–15.4)
WBC: 13.8 10*3/uL — ABNORMAL HIGH (ref 3.4–10.8)

## 2023-08-16 LAB — GLUCOSE TOLERANCE, 2 HOURS W/ 1HR
Glucose, 1 hour: 158 mg/dL (ref 70–179)
Glucose, 2 hour: 140 mg/dL (ref 70–152)
Glucose, Fasting: 113 mg/dL — ABNORMAL HIGH (ref 70–91)

## 2023-08-16 LAB — HIV ANTIBODY (ROUTINE TESTING W REFLEX): HIV Screen 4th Generation wRfx: NONREACTIVE

## 2023-08-16 LAB — RPR: RPR Ser Ql: NONREACTIVE

## 2023-08-16 LAB — ANTIBODY SCREEN: Antibody Screen: NEGATIVE

## 2023-08-17 ENCOUNTER — Encounter: Payer: Self-pay | Admitting: *Deleted

## 2023-08-17 ENCOUNTER — Other Ambulatory Visit: Payer: Self-pay | Admitting: Obstetrics & Gynecology

## 2023-08-17 DIAGNOSIS — O24410 Gestational diabetes mellitus in pregnancy, diet controlled: Secondary | ICD-10-CM

## 2023-08-17 MED ORDER — ACCU-CHEK SOFTCLIX LANCETS MISC: 12 refills | Status: DC

## 2023-08-17 MED ORDER — GLUCOSE BLOOD VI STRP: ORAL_STRIP | 12 refills | Status: DC

## 2023-08-17 MED ORDER — ACCU-CHEK GUIDE ME W/DEVICE KIT: 1.0000 | PACK | Freq: Four times a day (QID) | 0 refills | Status: DC

## 2023-08-29 ENCOUNTER — Encounter: Payer: Self-pay | Admitting: Obstetrics and Gynecology

## 2023-08-29 ENCOUNTER — Ambulatory Visit: Admitting: Obstetrics and Gynecology

## 2023-08-29 VITALS — BP 95/58 | HR 108 | Wt 226.8 lb

## 2023-08-29 DIAGNOSIS — Z98891 History of uterine scar from previous surgery: Secondary | ICD-10-CM | POA: Diagnosis not present

## 2023-08-29 DIAGNOSIS — Z3480 Encounter for supervision of other normal pregnancy, unspecified trimester: Secondary | ICD-10-CM

## 2023-08-29 DIAGNOSIS — O24410 Gestational diabetes mellitus in pregnancy, diet controlled: Secondary | ICD-10-CM

## 2023-08-29 DIAGNOSIS — Z3A28 28 weeks gestation of pregnancy: Secondary | ICD-10-CM

## 2023-08-29 NOTE — Progress Notes (Unsigned)
   PRENATAL VISIT NOTE  Subjective:  Alison Sandoval is a 28 y.o. G2P1001 at [redacted]w[redacted]d being seen today for ongoing prenatal care.  She is currently monitored for the following issues for this high-risk pregnancy and has Gestational diabetes; Postpartum depression/anxiety; History of gestational diabetes in prior pregnancy, currently pregnant; Encounter for supervision of normal pregnancy, antepartum; and Previous cesarean section on their problem list.  Patient reports no complaints.  Contractions: Irregular.  .  Movement: Present. Denies leaking of fluid.   The following portions of the patient's history were reviewed and updated as appropriate: allergies, current medications, past family history, past medical history, past social history, past surgical history and problem list.   Objective:   Vitals:   08/29/23 0910  BP: (!) 95/58  Pulse: (!) 108  Weight: 226 lb 12.8 oz (102.9 kg)    Fetal Status: Fetal Heart Rate (bpm): 136   Movement: Present     General:  Alert, oriented and cooperative. Patient is in no acute distress.  Skin: Skin is warm and dry. No rash noted.   Cardiovascular: Normal heart rate noted  Respiratory: Normal respiratory effort, no problems with respiration noted  Abdomen: Soft, gravid, appropriate for gestational age.  Pain/Pressure: Present     Pelvic: Cervical exam deferred        Extremities: Normal range of motion.  Edema: None  Mental Status: Normal mood and affect. Normal behavior. Normal judgment and thought content.   Assessment and Plan:  Pregnancy: G2P1001 at [redacted]w[redacted]d 1. Supervision of other normal pregnancy, antepartum (Primary)   2. Diet controlled gestational diabetes mellitus (GDM) in third trimester Forgot log, just started checking sugars Has diabetic education class tomorrow  Discussed dietary changes and incorporating exercise  Follow up u/s 4/22  3. History of cesarean section Still considering RCS  4. [redacted] weeks gestation of  pregnancy Desires Tdap next visit   Preterm labor symptoms and general obstetric precautions including but not limited to vaginal bleeding, contractions, leaking of fluid and fetal movement were reviewed in detail with the patient. Please refer to After Visit Summary for other counseling recommendations.   Return in two  weeks for ob visit   Future Appointments  Date Time Provider Department Center  09/12/2023 10:45 AM [facility] 2 [facility] None  09/12/2023 11:30 AM Ferd Householder, CNM [facility]    Susi Eric, FNP

## 2023-08-30 ENCOUNTER — Ambulatory Visit

## 2023-08-30 NOTE — Progress Notes (Deleted)
 Patient was seen on 08/30/2023 for Gestational Diabetes self-management class at [facility]. The following learning objectives were met by the patient during this course:  States the definition of Gestational Diabetes States why dietary management is important in controlling blood glucose Describes the effects each nutrient has on blood glucose levels Demonstrates ability to create a balanced meal plan Demonstrates carbohydrate counting  States when to check blood glucose levels Demonstrates proper blood glucose monitoring techniques States the effect of stress and exercise on blood glucose levels States the importance of limiting caffeine and abstaining from alcohol and smoking  Blood glucose monitor given: *** Lot # *** Exp: *** Blood glucose reading: ***  *** Patient has a meter prior to visit. Patient is *** testing pre breakfast and 2 hours after each meal. FBS: *** Postprandial: *** Blood glucose today in class ***  Patient instructed to monitor glucose levels: FBS: 60 - <90 1 hour: <140 2 hour: <120  *Patient received handouts: Nutrition Diabetes and Pregnancy Carbohydrate Counting List Blood glucose log Snack ideas for diabetes during pregnancy  Patient will be seen for follow-up as needed.

## 2023-09-03 ENCOUNTER — Inpatient Hospital Stay (HOSPITAL_COMMUNITY)
Admission: AD | Admit: 2023-09-03 | Discharge: 2023-09-03 | Disposition: A | Discharge status: Home / Self Care | Attending: Family Medicine | Admitting: Family Medicine

## 2023-09-03 ENCOUNTER — Encounter (HOSPITAL_COMMUNITY): Payer: Self-pay | Admitting: Family Medicine

## 2023-09-03 ENCOUNTER — Other Ambulatory Visit: Payer: Self-pay

## 2023-09-03 DIAGNOSIS — O98813 Other maternal infectious and parasitic diseases complicating pregnancy, third trimester: Secondary | ICD-10-CM | POA: Insufficient documentation

## 2023-09-03 DIAGNOSIS — N390 Urinary tract infection, site not specified: Secondary | ICD-10-CM | POA: Diagnosis not present

## 2023-09-03 DIAGNOSIS — Z3A29 29 weeks gestation of pregnancy: Secondary | ICD-10-CM | POA: Diagnosis not present

## 2023-09-03 DIAGNOSIS — R102 Pelvic and perineal pain: Secondary | ICD-10-CM | POA: Diagnosis present

## 2023-09-03 DIAGNOSIS — R110 Nausea: Secondary | ICD-10-CM

## 2023-09-03 DIAGNOSIS — O210 Mild hyperemesis gravidarum: Secondary | ICD-10-CM | POA: Insufficient documentation

## 2023-09-03 DIAGNOSIS — O26893 Other specified pregnancy related conditions, third trimester: Secondary | ICD-10-CM | POA: Insufficient documentation

## 2023-09-03 DIAGNOSIS — O2343 Unspecified infection of urinary tract in pregnancy, third trimester: Secondary | ICD-10-CM | POA: Insufficient documentation

## 2023-09-03 DIAGNOSIS — O26819 Pregnancy related exhaustion and fatigue, unspecified trimester: Secondary | ICD-10-CM

## 2023-09-03 DIAGNOSIS — O26899 Other specified pregnancy related conditions, unspecified trimester: Secondary | ICD-10-CM

## 2023-09-03 DIAGNOSIS — B379 Candidiasis, unspecified: Secondary | ICD-10-CM

## 2023-09-03 LAB — URINALYSIS, ROUTINE W REFLEX MICROSCOPIC
Bilirubin Urine: NEGATIVE
Glucose, UA: NEGATIVE mg/dL
Hgb urine dipstick: NEGATIVE
Ketones, ur: NEGATIVE mg/dL
Nitrite: NEGATIVE
Protein, ur: NEGATIVE mg/dL
Specific Gravity, Urine: 1.003 — ABNORMAL LOW (ref 1.005–1.030)
pH: 7 (ref 5.0–8.0)

## 2023-09-03 LAB — COMPREHENSIVE METABOLIC PANEL WITH GFR
ALT: 7 U/L (ref 0–44)
AST: 17 U/L (ref 15–41)
Albumin: 2.5 g/dL — ABNORMAL LOW (ref 3.5–5.0)
Alkaline Phosphatase: 164 U/L — ABNORMAL HIGH (ref 38–126)
Anion gap: 9 (ref 5–15)
BUN: <5 mg/dL — ABNORMAL LOW (ref 6–20)
CO2: 17 mmol/L — ABNORMAL LOW (ref 22–32)
Calcium: 8.7 mg/dL — ABNORMAL LOW (ref 8.9–10.3)
Chloride: 108 mmol/L (ref 98–111)
Creatinine, Ser: 0.53 mg/dL (ref 0.44–1.00)
GFR, Estimated: >60 mL/min (ref >60)
Glucose, Bld: 93 mg/dL (ref 70–99)
Potassium: 3.6 mmol/L (ref 3.5–5.1)
Sodium: 134 mmol/L — ABNORMAL LOW (ref 135–145)
Total Bilirubin: 0.6 mg/dL (ref 0.0–1.2)
Total Protein: 6.7 g/dL (ref 6.5–8.1)

## 2023-09-03 LAB — RESP PANEL BY RT-PCR (RSV, FLU A&B, COVID)  RVPGX2
Influenza A by PCR: NEGATIVE
Influenza B by PCR: NEGATIVE
Resp Syncytial Virus by PCR: NEGATIVE
SARS Coronavirus 2 by RT PCR: NEGATIVE

## 2023-09-03 LAB — WET PREP, GENITAL
Clue Cells Wet Prep HPF POC: NONE SEEN
Sperm: NONE SEEN
Trich, Wet Prep: NONE SEEN
WBC, Wet Prep HPF POC: >=10 — AB (ref <10)

## 2023-09-03 LAB — CBC
HCT: 30.4 % — ABNORMAL LOW (ref 36.0–46.0)
Hemoglobin: 9.8 g/dL — ABNORMAL LOW (ref 12.0–15.0)
MCH: 24.7 pg — ABNORMAL LOW (ref 26.0–34.0)
MCHC: 32.2 g/dL (ref 30.0–36.0)
MCV: 76.6 fL — ABNORMAL LOW (ref 80.0–100.0)
Platelets: 248 10*3/uL (ref 150–400)
RBC: 3.97 MIL/uL (ref 3.87–5.11)
RDW: 14.4 % (ref 11.5–15.5)
WBC: 17.2 10*3/uL — ABNORMAL HIGH (ref 4.0–10.5)
nRBC: 0 % (ref 0.0–0.2)

## 2023-09-03 MED ORDER — ONDANSETRON 4 MG PO TBDP
8.0000 mg | ORAL_TABLET | Freq: Once | ORAL | Status: AC
  Administered 2023-09-03: 8 mg via ORAL
  Filled 2023-09-03: qty 2

## 2023-09-03 MED ORDER — TERCONAZOLE 0.8 % VA CREA: 1.0000 | TOPICAL_CREAM | Freq: Every day | VAGINAL | 0 refills | Status: DC

## 2023-09-03 MED ORDER — FERROUS SULFATE 325 (65 FE) MG PO TABS: 325.0000 mg | ORAL_TABLET | ORAL | 0 refills | Status: DC

## 2023-09-03 MED ORDER — NITROFURANTOIN MONOHYD MACRO 100 MG PO CAPS: 100.0000 mg | ORAL_CAPSULE | Freq: Two times a day (BID) | ORAL | 0 refills | Status: DC

## 2023-09-03 MED ORDER — ONDANSETRON HCL 4 MG PO TABS: 8.0000 mg | ORAL_TABLET | Freq: Two times a day (BID) | ORAL | 0 refills | Status: DC

## 2023-09-03 NOTE — MAU Provider Note (Signed)
 Chief Complaint:  Pelvic Pain, Nausea, and Dizziness   HPI    Alison Sandoval is a 28 y.o. G2P1001 at [redacted]w[redacted]d who presents to maternity admissions reporting Nausea and dizziness today with fatigue. States she just didn't feel well today and slept most of the day. Denies any fever, Chills, Vomiting, no sick contacts.   She reports she was recently dx with GDM but hasn't taken her class yet so she has not modified her diet yet but has been checking her CBG's and reports they have been normal. Reports she was only able to eat a fig bar today. Has antiemetics at home but didn't take any today. Reports no dysuria and regular BM's.   Denies VB,LOF, CTX's but has had pelvic pressure since 21 weeks which she is still c/o today and reports she has tried Belly band with minimal relief.   [facility]  Past Medical History:  Diagnosis Date   Chronic kidney disease    UTI   Diabetes mellitus without complication (HCC)    GERD (gastroesophageal reflux disease)    Gestational diabetes    Headache    OB History  Gravida Para Term Preterm AB Living  2 1 1   1   SAB IAB Ectopic Multiple Live Births     0 1    # Outcome Date GA Lbr Len/2nd Weight Sex Type Anes PTL Lv  2 Current           1 Term 06/16/18 [redacted]w[redacted]d  3195 g M CS-LTranv EPI N LIV     Complications: Fetal Intolerance   Past Surgical History:  Procedure Laterality Date   CESAREAN SECTION N/A 06/16/2018   Procedure: CESAREAN SECTION;  Surgeon: Verlyn Goad, MD;  [facility]: [facility];  Service: Obstetrics;  Laterality: N/A;   Family History  Problem Relation Age of Onset   Hypertension Paternal Grandmother    Hyperlipidemia Maternal Grandmother    Hypertension Father    Hyperlipidemia Father    Hypertension Mother    Asthma Mother    Social History   Tobacco Use   Smoking status: Never   Smokeless tobacco: Never  Vaping Use   Vaping status: Former  Substance Use Topics   Alcohol use: Not Currently     Comment: social   Drug use: Not Currently    Types: Marijuana    Comment: before pregnancy   Allergies  Allergen Reactions   Caramel Rash   Medications Prior to Admission  Medication Sig Dispense Refill Last Dose/Taking   Accu-Chek Softclix Lancets lancets Use as instructed to check blood sugar 4 times daily 100 each 12 09/03/2023   aspirin EC 81 MG tablet Take 1 tablet (81 mg total) by mouth daily. Swallow whole. 90 tablet 3 09/02/2023   Blood Glucose Monitoring Suppl (ACCU-CHEK GUIDE ME) w/Device KIT 1 each by Does not apply route 4 (four) times daily. 1 kit 0 09/03/2023   Blood Pressure Monitor MISC For regular home bp monitoring during pregnancy 1 each 0 09/03/2023   glucose blood test strip Use as instructed to check blood sugar four times daily 100 each 12 09/03/2023   ondansetron (ZOFRAN) 4 MG tablet Take 1 tablet (4 mg total) by mouth every 8 (eight) hours as needed. 20 tablet 1 Past Week   OVER THE COUNTER MEDICATION Lonne Roan made gummy prenatal vitamins   09/03/2023   senna (SENOKOT) 8.6 MG TABS tablet Take 1 tablet (8.6 mg total) by mouth 2 (two) times daily as needed for moderate  constipation. 120 tablet 0 Past Month    I have reviewed patient's Past Medical Hx, Surgical Hx, Family Hx, Social Hx, medications and allergies.   ROS  Pertinent items noted in HPI and remainder of comprehensive ROS otherwise negative.   PHYSICAL EXAM  Patient Vitals for the past 24 hrs:  BP Temp Temp src Pulse Resp SpO2 Height Weight  09/03/23 2201 113/67 -- -- (!) 102 -- -- -- --  09/03/23 2133 117/70 98.1 F (36.7 C) Oral 100 18 100 % 5\' 4"  (1.626 m) 103.3 kg    Constitutional: Well-developed, obese  female in no acute distress.  Cardiovascular: tachycardia  rate , warm and well-perfused Respiratory: normal effort, no problems with respiration noted GI: Abd soft, non-tender, gravid MS: Extremities nontender, no edema, normal ROM Neurologic: Alert and oriented x 4.  GU: no CVA  tenderness Pelvic:  deferred     Fetal Tracing: Cat 1 reactive for GA @ 2248 Baseline: 140 Variability:moderate  Accelerations: present Decelerations: absent Toco: quite   Labs: Results for orders placed or performed during the hospital encounter of 09/03/23 (from the past 24 hours)  CBC     Status: Abnormal   Collection Time: 09/03/23  9:54 PM  Result Value Ref Range   WBC 17.2 (H) 4.0 - 10.5 K/uL   RBC 3.97 3.87 - 5.11 MIL/uL   Hemoglobin 9.8 (L) 12.0 - 15.0 g/dL   HCT 09.8 (L) 11.9 - 14.7 %   MCV 76.6 (L) 80.0 - 100.0 fL   MCH 24.7 (L) 26.0 - 34.0 pg   MCHC 32.2 30.0 - 36.0 g/dL   RDW 82.9 56.2 - 13.0 %   Platelets 248 150 - 400 K/uL   nRBC 0.0 0.0 - 0.2 %  Comprehensive metabolic panel     Status: Abnormal   Collection Time: 09/03/23  9:54 PM  Result Value Ref Range   Sodium 134 (L) 135 - 145 mmol/L   Potassium 3.6 3.5 - 5.1 mmol/L   Chloride 108 98 - 111 mmol/L   CO2 17 (L) 22 - 32 mmol/L   Glucose, Bld 93 70 - 99 mg/dL   BUN <5 (L) 6 - 20 mg/dL   Creatinine, Ser 8.65 0.44 - 1.00 mg/dL   Calcium 8.7 (L) 8.9 - 10.3 mg/dL   Total Protein 6.7 6.5 - 8.1 g/dL   Albumin 2.5 (L) 3.5 - 5.0 g/dL   AST 17 15 - 41 U/L   ALT 7 0 - 44 U/L   Alkaline Phosphatase 164 (H) 38 - 126 U/L   Total Bilirubin 0.6 0.0 - 1.2 mg/dL   GFR, Estimated >78 >46 mL/min   Anion gap 9 5 - 15  Urinalysis, Routine w reflex microscopic -Urine, Clean Catch     Status: Abnormal   Collection Time: 09/03/23 10:09 PM  Result Value Ref Range   Color, Urine YELLOW YELLOW   APPearance CLOUDY (A) CLEAR   Specific Gravity, Urine 1.003 (L) 1.005 - 1.030   pH 7.0 5.0 - 8.0   Glucose, UA NEGATIVE NEGATIVE mg/dL   Hgb urine dipstick NEGATIVE NEGATIVE   Bilirubin Urine NEGATIVE NEGATIVE   Ketones, ur NEGATIVE NEGATIVE mg/dL   Protein, ur NEGATIVE NEGATIVE mg/dL   Nitrite NEGATIVE NEGATIVE   Leukocytes,Ua MODERATE (A) NEGATIVE   RBC / HPF 0-5 0 - 5 RBC/hpf   WBC, UA 6-10 0 - 5 WBC/hpf   Bacteria, UA  MANY (A) NONE SEEN   Squamous Epithelial / HPF 21-50 0 - 5 /HPF  Mucus PRESENT   Wet prep, genital     Status: Abnormal   Collection Time: 09/03/23 10:09 PM   Specimen: Vaginal  Result Value Ref Range   Yeast Wet Prep HPF POC PRESENT (A) NONE SEEN   Trich, Wet Prep NONE SEEN NONE SEEN   Clue Cells Wet Prep HPF POC NONE SEEN NONE SEEN   WBC, Wet Prep HPF POC >=10 (A) <10   Sperm NONE SEEN     Imaging:  No results found.  MDM & MAU COURSE  MDM:  HIGH   Orthostatic BP NM Yeast  on wet prep Possible UTI/ sent for cx will treat ppx for symptoms  Leucocytosis on CBC- no evidence of acute infection at this time Respiratory panel pending PO Challenge tolerated  NST reactive, no ctx  MAU Course: Orders Placed This Encounter  Procedures   Wet prep, genital   Culture, OB Urine   Resp panel by RT-PCR (RSV, Flu A&B, Covid) Anterior Nasal Swab   Urinalysis, Routine w reflex microscopic -Urine, Clean Catch   CBC   Comprehensive metabolic panel   Orthostatic vital signs   Airborne and Contact precautions   Discharge patient Discharge disposition: 01-Home or Self Care; Discharge patient date: 09/03/2023   Meds ordered this encounter  Medications   ondansetron (ZOFRAN-ODT) disintegrating tablet 8 mg   terconazole (TERAZOL 3) 0.8 % vaginal cream    Sig: Place 1 applicator vaginally at bedtime.    Dispense:  20 g    Refill:  0    Supervising Provider:   PRATT, TANYA S [[id number]]   nitrofurantoin, macrocrystal-monohydrate, (MACROBID) 100 MG capsule    Sig: Take 1 capsule (100 mg total) by mouth 2 (two) times daily.    Dispense:  10 capsule    Refill:  0    Supervising Provider:   PRATT, TANYA S [[id number]]   ondansetron (ZOFRAN) 4 MG tablet    Sig: Take 2 tablets (8 mg total) by mouth 2 (two) times daily.    Dispense:  20 tablet    Refill:  0    Supervising Provider:   PRATT, TANYA S [[id number]]    I have reviewed the patient chart and performed the physical exam . I have ordered &  interpreted the lab results and reviewed and interpreted the NST Medications ordered as stated below.  A/P as described below.  Counseling and education provided and patient agreeable  with plan as described below. Verbalized understanding.    ASSESSMENT   1. Nausea   2. Pregnancy related fatigue, antepartum   3. Pelvic pressure in pregnancy, antepartum   4. Urinary tract infection in mother during third trimester of pregnancy   5. Yeast detected   6. [redacted] weeks gestation of pregnancy     PLAN  Discharge home in stable condition with return precautions.  F/U as scheduled  See AVS for full description of information given to the patient including both verbal and written. Patient verbalized understanding and agrees with the plan as described above.  Future Appointments  Date Time Provider Department Center  09/12/2023 10:45 AM [facility] 2 [facility] None  09/12/2023 11:30 AM Ferd Householder, CNM [facility]      Follow-up Information     [facility] at [facility] Follow up.   Specialty: Obstetrics and Gynecology Why: If symptoms worsen or fail to resolve, As scheduled for ongoing prenatal care Contact information: [address] [place]  [phone]  [phone]                Allergies as of 09/03/2023       Reactions   Caramel Rash        Medication List     TAKE these medications    Accu-Chek Guide Me w/Device Kit 1 each by Does not apply route 4 (four) times daily.   Accu-Chek Softclix Lancets lancets Use as instructed to check blood sugar 4 times daily   aspirin EC 81 MG tablet Take 1 tablet (81 mg total) by mouth daily. Swallow whole.   Blood Pressure Monitor Misc For regular home bp monitoring during pregnancy   glucose blood test strip Use as instructed to check blood sugar four times daily   nitrofurantoin (macrocrystal-monohydrate) 100 MG capsule Commonly known as: MACROBID Take 1  capsule (100 mg total) by mouth 2 (two) times daily.   ondansetron 4 MG tablet Commonly known as: Zofran Take 1 tablet (4 mg total) by mouth every 8 (eight) hours as needed. What changed: Another medication with the same name was added. Make sure you understand how and when to take each.   ondansetron 4 MG tablet Commonly known as: Zofran Take 2 tablets (8 mg total) by mouth 2 (two) times daily. What changed: You were already taking a medication with the same name, and this prescription was added. Make sure you understand how and when to take each.   OVER THE COUNTER MEDICATION Lonne Roan made gummy prenatal vitamins   senna 8.6 MG Tabs tablet Commonly known as: SENOKOT Take 1 tablet (8.6 mg total) by mouth 2 (two) times daily as needed for moderate constipation.   terconazole 0.8 % vaginal cream Commonly known as: TERAZOL 3 Place 1 applicator vaginally at bedtime.        Debbe Fail, MSN, [facility], Center for [employer]

## 2023-09-03 NOTE — MAU Note (Signed)
.  Alison Sandoval is a 28 y.o. at [redacted]w[redacted]d here in MAU reporting: called OB office and they told her to come in. Having ongoing pelvic pain/pressure, nausea, and dizziness. Denies VB or LOF. +FM   Onset of complaint: yesterday Pain score: 6  Vitals:   09/03/23 2133  BP: 117/70  Pulse: 100  Resp: 18  Temp: 98.1 F (36.7 C)  SpO2: 100%     FHT: 144  Lab orders placed from triage: UA

## 2023-09-03 NOTE — Discharge Instructions (Signed)

## 2023-09-04 LAB — CULTURE, OB URINE: Culture: <10000 — AB

## 2023-09-04 LAB — GC/CHLAMYDIA PROBE AMP (~~LOC~~) NOT AT ARMC
Chlamydia: NEGATIVE
Comment: NEGATIVE
Comment: NORMAL
Neisseria Gonorrhea: NEGATIVE

## 2023-09-05 ENCOUNTER — Other Ambulatory Visit: Payer: Self-pay | Admitting: Obstetrics & Gynecology

## 2023-09-05 DIAGNOSIS — O34219 Maternal care for unspecified type scar from previous cesarean delivery: Secondary | ICD-10-CM

## 2023-09-05 DIAGNOSIS — O24410 Gestational diabetes mellitus in pregnancy, diet controlled: Secondary | ICD-10-CM

## 2023-09-05 DIAGNOSIS — Z6839 Body mass index (BMI) 39.0-39.9, adult: Secondary | ICD-10-CM

## 2023-09-12 ENCOUNTER — Ambulatory Visit: Admitting: Radiology

## 2023-09-12 ENCOUNTER — Encounter: Payer: Self-pay | Admitting: Women's Health

## 2023-09-12 ENCOUNTER — Ambulatory Visit: Admitting: Women's Health

## 2023-09-12 VITALS — BP 126/75 | HR 112 | Wt 228.0 lb

## 2023-09-12 DIAGNOSIS — O24410 Gestational diabetes mellitus in pregnancy, diet controlled: Secondary | ICD-10-CM

## 2023-09-12 DIAGNOSIS — O34219 Maternal care for unspecified type scar from previous cesarean delivery: Secondary | ICD-10-CM | POA: Diagnosis not present

## 2023-09-12 DIAGNOSIS — Z3A30 30 weeks gestation of pregnancy: Secondary | ICD-10-CM

## 2023-09-12 DIAGNOSIS — Z23 Encounter for immunization: Secondary | ICD-10-CM | POA: Diagnosis not present

## 2023-09-12 DIAGNOSIS — O0990 Supervision of high risk pregnancy, unspecified, unspecified trimester: Secondary | ICD-10-CM

## 2023-09-12 DIAGNOSIS — O0993 Supervision of high risk pregnancy, unspecified, third trimester: Secondary | ICD-10-CM

## 2023-09-12 DIAGNOSIS — O24415 Gestational diabetes mellitus in pregnancy, controlled by oral hypoglycemic drugs: Secondary | ICD-10-CM | POA: Diagnosis not present

## 2023-09-12 DIAGNOSIS — Z6839 Body mass index (BMI) 39.0-39.9, adult: Secondary | ICD-10-CM | POA: Diagnosis not present

## 2023-09-12 MED ORDER — METFORMIN HCL 500 MG PO TABS: 500.0000 mg | ORAL_TABLET | Freq: Two times a day (BID) | ORAL | 3 refills | Status: DC

## 2023-09-12 NOTE — Patient Instructions (Signed)
 Lechelle, thank you for choosing our office today! We appreciate the opportunity to meet your healthcare needs. You may receive a short survey by mail, e-mail, or through [employer]. If you are happy with your care we would appreciate if you could take just a few minutes to complete the survey questions. We read all of your comments and take your feedback very seriously. Thank you again for choosing our office.  [facility] [employer] Team at [facility]  [facility] at [facility] ([address] [place], [place] [address]) Entrance C, located off of [address] Free 24/7 valet parking   CLASSES: Go to [email or web address] to register for classes (childbirth, breastfeeding, waterbirth, infant CPR, daddy bootcamp, etc.)  Call the office [phone]) or go to [facility] if: You begin to have strong, frequent contractions Your water breaks.  Sometimes it is a big gush of fluid, sometimes it is just a trickle that keeps getting your panties wet or running down your legs You have vaginal bleeding.  It is normal to have a small amount of spotting if your cervix was checked.  You don't feel your baby moving like normal.  If you don't, get you something to eat and drink and lay down and focus on feeling your baby move.   If your baby is still not moving like normal, you should call the office or go to [facility]  Call the office [phone]) or go to [facility] hospital for these signs of pre-eclampsia: Severe headache that does not go away with Tylenol  Visual changes- seeing spots, double, blurred vision Pain under your right breast or upper abdomen that does not go away with Tums or heartburn medicine Nausea and/or vomiting Severe swelling in your hands, feet, and face   Tdap Vaccine It is recommended that you get the Tdap vaccine during the third trimester of EACH pregnancy to help protect your baby from getting pertussis (whooping cough) 27-36 weeks is the BEST time to do  this so that you can pass the protection on to your baby. During pregnancy is better than after pregnancy, but if you are unable to get it during pregnancy it will be offered at the hospital.  You can get this vaccine with us , at the health department, your family doctor, or some local pharmacies Everyone who will be around your baby should also be up-to-date on their vaccines before the baby comes. Adults (who are not pregnant) only need 1 dose of Tdap during adulthood.   [facility] Pediatricians/Family Doctors [facility]): [address] Meg Spina, [phone]           [facility]: [address], [phone]                [facility] Medicine [facility]): [address], [phone] (call to ask if accepting patients) [facility] Department: [address], [place], [phone]    [facility] Pediatricians/Family Doctors [facility]): [phone] [address], [phone] [facility]: [address] [place], [phone] [facility]: [address], [phone]  [facility] Doctors  [facility]): [phone] [facility]: [address] [phone]   [facility] Doctors [facility]: [address], [phone]  [facility] Family Doctors  [facility]: [phone], [phone]  Home Blood Pressure Monitoring for Patients   Your provider has recommended that you check your  blood pressure (BP) at least once a week at home. If you do not have a blood pressure cuff at home, one will be provided for you. Contact your provider if you have not received your monitor within 1 week.   Helpful Tips for Accurate Home Blood Pressure Checks  Don't smoke, exercise, or drink caffeine 30 minutes before checking your BP Use the restroom before checking your BP (a full bladder can raise your  pressure) Relax in a comfortable upright chair Feet on the ground Left arm resting comfortably on a flat surface at the level of your heart Legs uncrossed Back supported Sit quietly and don't talk Place the cuff on your bare arm Adjust snuggly, so that only two fingertips can fit between your skin and the top of the cuff Check 2 readings separated by at least one minute Keep a log of your BP readings For a visual, please reference this diagram: http://ccnc.care/bpdiagram  Provider Name: [facility]     Phone: [phone]  Zone 1: ALL CLEAR  Continue to monitor your symptoms:  BP reading is less than 140 (top number) or less than 90 (bottom number)  No right upper stomach pain No headaches or seeing spots No feeling nauseated or throwing up No swelling in face and hands  Zone 2: CAUTION Call your doctor's office for any of the following:  BP reading is greater than 140 (top number) or greater than 90 (bottom number)  Stomach pain under your ribs in the middle or right side Headaches or seeing spots Feeling nauseated or throwing up Swelling in face and hands  Zone 3: EMERGENCY  Seek immediate medical care if you have any of the following:  BP reading is greater than160 (top number) or greater than 110 (bottom number) Severe headaches not improving with Tylenol  Serious difficulty catching your breath Any worsening symptoms from Zone 2  Preterm Labor and Birth Information  The normal length of a pregnancy is 39-41 weeks. Preterm labor is when labor starts before 37 completed weeks of pregnancy. What are the risk factors for preterm labor? Preterm labor is more likely to occur in women who: Have certain infections during pregnancy such as a bladder infection, sexually transmitted infection, or infection inside the uterus (chorioamnionitis). Have a shorter-than-normal cervix. Have gone into preterm labor before. Have had surgery on their cervix. Are younger than age 51  or older than age 87. Are African American. Are pregnant with twins or multiple babies (multiple gestation). Take street drugs or smoke while pregnant. Do not gain enough weight while pregnant. Became pregnant shortly after having been pregnant. What are the symptoms of preterm labor? Symptoms of preterm labor include: Cramps similar to those that can happen during a menstrual period. The cramps may happen with diarrhea. Pain in the abdomen or lower back. Regular uterine contractions that may feel like tightening of the abdomen. A feeling of increased pressure in the pelvis. Increased watery or bloody mucus discharge from the vagina. Water breaking (ruptured amniotic sac). Why is it important to recognize signs of preterm labor? It is important to recognize signs of preterm labor because babies who are born prematurely may not be fully developed. This can put them at an increased risk for: Long-term (chronic) heart and lung problems. Difficulty immediately after birth with regulating body systems, including blood sugar, body temperature, heart rate, and breathing rate. Bleeding in the brain. Cerebral palsy. Learning difficulties. Death. These risks are highest for babies who are born before 34 weeks  of pregnancy. How is preterm labor treated? Treatment depends on the length of your pregnancy, your condition, and the health of your baby. It may involve: Having a stitch (suture) placed in your cervix to prevent your cervix from opening too early (cerclage). Taking or being given medicines, such as: Hormone medicines. These may be given early in pregnancy to help support the pregnancy. Medicine to stop contractions. Medicines to help mature the baby's lungs. These may be prescribed if the risk of delivery is high. Medicines to prevent your baby from developing cerebral palsy. If the labor happens before 34 weeks of pregnancy, you may need to stay in the hospital. What should I do if I  think I am in preterm labor? If you think that you are going into preterm labor, call your health care provider right away. How can I prevent preterm labor in future pregnancies? To increase your chance of having a full-term pregnancy: Do not use any tobacco products, such as cigarettes, chewing tobacco, and e-cigarettes. If you need help quitting, ask your health care provider. Do not use street drugs or medicines that have not been prescribed to you during your pregnancy. Talk with your health care provider before taking any herbal supplements, even if you have been taking them regularly. Make sure you gain a healthy amount of weight during your pregnancy. Watch for infection. If you think that you might have an infection, get it checked right away. Make sure to tell your health care provider if you have gone into preterm labor before. This information is not intended to replace advice given to you by your health care provider. Make sure you discuss any questions you have with your health care provider. Document Revised: 08/31/2018 Document Reviewed: 09/30/2015 Elsevier Patient Education  2020 [employer].

## 2023-09-12 NOTE — Progress Notes (Signed)
 HIGH-RISK PREGNANCY VISIT Patient name: Alison Sandoval MRN [id number]  Date of birth: [birth year] Chief Complaint:   Routine Prenatal Visit  History of Present Illness:   Alison Sandoval is a 28 y.o. 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    Today she reports  fastings 87-113 (all but 2 [name]), 2hr pp w/ 1/3 ^120 . Contractions: Not present. Vag. Bleeding: None.  Movement: Present. denies leaking of fluid.      08/15/2023   10:08 AM 05/02/2023   10:32 AM 01/03/2023    1:27 PM 04/16/2018    4:14 PM 04/16/2018    4:13 PM  Depression screen PHQ 2/9  Decreased Interest 0 0 1 1 1   Down, Depressed, Hopeless 0 0 0 1 1  PHQ - 2 Score 0 0 1 2 2   Altered sleeping 1 3 3 1    Tired, decreased energy 1 2 3 1    Change in appetite 0 2 3 1    Feeling bad or failure about yourself  0 0 1 0   Trouble concentrating 0 1 1 1    Moving slowly or fidgety/restless 0 0 0 1   Suicidal thoughts 0 0 0 0   PHQ-9 Score 2 8 12 7    Difficult doing work/chores    Somewhat difficult         05/02/2023   10:32 AM 01/03/2023    1:27 PM  GAD 7 : Generalized Anxiety Score  Nervous, Anxious, on Edge 0 3  Control/stop worrying 0 3  Worry too much - different things 1 3  Trouble relaxing 1 2  Restless 0 1  Easily annoyed or irritable 0 1  Afraid - awful might happen 1 2  Total GAD 7 Score 3 15     Review of Systems:   Pertinent items are noted in HPI Denies abnormal vaginal discharge w/ itching/odor/irritation, headaches, visual changes, shortness of breath, chest pain, abdominal pain, severe nausea/vomiting, or problems with urination or bowel movements unless otherwise stated above. Pertinent History Reviewed:  Reviewed past medical,surgical, social, obstetrical and family history.  Reviewed problem list, medications and allergies. Physical Assessment:   Vitals:    09/12/23 1130  BP: 126/75  Pulse: (!) 112  Weight: 228 lb (103.4 kg)  Body mass index is 39.14 kg/m.           Physical Examination:   General appearance: alert, well appearing, and in no distress  Mental status: alert, oriented to person, place, and time  Skin: warm & dry   Extremities:      Cardiovascular: normal heart rate noted  Respiratory: normal respiratory effort, no distress  Abdomen: gravid, soft, non-tender  Pelvic: Cervical exam deferred         Fetal Status:     Movement: Present    Fetal Surveillance Testing today: US : GA = 30+6 weeks Single active female fetus, cephalic, FHR = 153 bpm, AFI = 16.8 cm, 66%, MVP = 5 cm Posterior pl, gr1    EFW 57%, 1764g  cervix closed  Chaperone: N/A  No results found for this or any previous visit (from the past 24 hours).  Assessment & Plan:  High-risk pregnancy: G2P1001 at [redacted]w[redacted]d with an Estimated Date of Delivery: 11/15/23   1) A2DM, start metformin  500mg  BID today, cut out juices/peppermints, increase protein, high protein snack at night, walking after meals. 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    Dispense:  60 tablet    Refill:  3    Labs/procedures today: Tdap and U/S  Treatment Plan:  EFW q 4-6w @ 28w   2x/wk NST or weekly bpp @ 32wks     Deliver @ 39-39.6wks:______   Reviewed: Preterm labor symptoms and general obstetric precautions including but not limited to vaginal bleeding, contractions, leaking of fluid and fetal movement were reviewed in detail with the patient.  All questions were answered. Does have home bp cuff. Office bp cuff given: not applicable. Check bp weekly, let us  know if consistently >140 and/or >90.  Follow-up: Return in about 9 days (around 09/21/2023) for start 2x/wk NST (one w/  HROB one w/ RN) on 5/1, EFW/BPP q4wk (no NST on those days).   Future Appointments  Date Time Provider Department Center  09/21/2023  9:50 AM [facility] NURSE [facility]  09/25/2023  3:30 PM [facility] NURSE [facility]    Orders Placed This Encounter  Procedures   Tdap vaccine greater than or equal to 7yo IM   Ferd Householder CNM, [facility] 09/12/2023 12:06 PM

## 2023-09-12 NOTE — Progress Notes (Signed)
 US : GA = 30+6 weeks Single active female fetus, cephalic, FHR = 153 bpm, AFI = 16.8 cm, 66%, MVP = 5 cm Posterior pl, gr1    EFW 57%, 1764g  cervix closed

## 2023-09-21 ENCOUNTER — Other Ambulatory Visit

## 2023-09-22 ENCOUNTER — Ambulatory Visit (INDEPENDENT_AMBULATORY_CARE_PROVIDER_SITE_OTHER)

## 2023-09-22 VITALS — BP 117/80 | HR 116

## 2023-09-22 DIAGNOSIS — Z3A32 32 weeks gestation of pregnancy: Secondary | ICD-10-CM | POA: Diagnosis not present

## 2023-09-22 DIAGNOSIS — O24415 Gestational diabetes mellitus in pregnancy, controlled by oral hypoglycemic drugs: Secondary | ICD-10-CM

## 2023-09-22 NOTE — Progress Notes (Signed)
   NURSE VISIT- NST  SUBJECTIVE:  Alison Sandoval is a 28 y.o. G61P1001 female at [redacted]w[redacted]d, here for a NST for pregnancy complicated by Diabetes: A2DM.  She reports active fetal movement, contractions: none, vaginal bleeding: none, membranes: intact.   OBJECTIVE:  BP 117/80   Pulse (!) 116   LMP 02/08/2023 (Exact Date) Comment: Shorter than normal  Appears well, no apparent distress  No results found for this or any previous visit (from the past 24 hours).  NST: FHR baseline 150 bpm, Variability: moderate, Accelerations:10x10 intermittent tracing, gross fetal movement, Decelerations:  Absent= Cat 1/reactive and 10x10s (no 15x15s) Toco: none   ASSESSMENT: G2P1001 at [redacted]w[redacted]d with Diabetes: A2DM NST reactive Audible and visible movement confirmed by MOB  PLAN: EFM strip reviewed by Dr. Randolm Butte   Recommendations: keep next appointment as scheduled    Laverne Potter  09/22/2023 12:27 PM

## 2023-09-25 ENCOUNTER — Other Ambulatory Visit

## 2023-09-26 ENCOUNTER — Other Ambulatory Visit

## 2023-09-26 ENCOUNTER — Ambulatory Visit: Admitting: Women's Health

## 2023-09-26 ENCOUNTER — Encounter: Payer: Self-pay | Admitting: Women's Health

## 2023-09-26 VITALS — BP 127/74 | HR 109 | Wt 229.6 lb

## 2023-09-26 DIAGNOSIS — O0993 Supervision of high risk pregnancy, unspecified, third trimester: Secondary | ICD-10-CM

## 2023-09-26 DIAGNOSIS — O24419 Gestational diabetes mellitus in pregnancy, unspecified control: Secondary | ICD-10-CM | POA: Diagnosis not present

## 2023-09-26 DIAGNOSIS — Z6839 Body mass index (BMI) 39.0-39.9, adult: Secondary | ICD-10-CM | POA: Diagnosis not present

## 2023-09-26 DIAGNOSIS — Z3A32 32 weeks gestation of pregnancy: Secondary | ICD-10-CM | POA: Diagnosis not present

## 2023-09-26 DIAGNOSIS — O0990 Supervision of high risk pregnancy, unspecified, unspecified trimester: Secondary | ICD-10-CM

## 2023-09-26 NOTE — Patient Instructions (Signed)
 Alison Sandoval, thank you for choosing our office today! We appreciate the opportunity to meet your healthcare needs. You may receive a short survey by mail, e-mail, or through [employer]. If you are happy with your care we would appreciate if you could take just a few minutes to complete the survey questions. We read all of your comments and take your feedback very seriously. Thank you again for choosing our office.  Center for [employer] Team at [facility]  [facility] at [facility] ([address] [place], [place] [address]) Entrance C, located off of [address] Free 24/7 valet parking   CLASSES: Go to [email or web address] to register for classes (childbirth, breastfeeding, waterbirth, infant CPR, daddy bootcamp, etc.)  Call the office [phone]) or go to [facility] if: You begin to have strong, frequent contractions Your water breaks.  Sometimes it is a big gush of fluid, sometimes it is just a trickle that keeps getting your panties wet or running down your legs You have vaginal bleeding.  It is normal to have a small amount of spotting if your cervix was checked.  You don't feel your baby moving like normal.  If you don't, get you something to eat and drink and lay down and focus on feeling your baby move.   If your baby is still not moving like normal, you should call the office or go to [facility]  Call the office [phone]) or go to [facility] hospital for these signs of pre-eclampsia: Severe headache that does not go away with Tylenol  Visual changes- seeing spots, double, blurred vision Pain under your right breast or upper abdomen that does not go away with Tums or heartburn medicine Nausea and/or vomiting Severe swelling in your hands, feet, and face   Tdap Vaccine It is recommended that you get the Tdap vaccine during the third trimester of EACH pregnancy to help protect your baby from getting pertussis (whooping cough) 27-36 weeks is the BEST time to do  this so that you can pass the protection on to your baby. During pregnancy is better than after pregnancy, but if you are unable to get it during pregnancy it will be offered at the hospital.  You can get this vaccine with us , at the health department, your family doctor, or some local pharmacies Everyone who will be around your baby should also be up-to-date on their vaccines before the baby comes. Adults (who are not pregnant) only need 1 dose of Tdap during adulthood.   [facility] Pediatricians/Family Doctors [facility]): [address] Meg Spina, [phone]           [facility]: [address], [phone]                [facility] Medicine [facility]): [address], [phone] (call to ask if accepting patients) [facility] Department: [address], [place], [phone]    [facility] Pediatricians/Family Doctors [facility]): [phone] [address], [phone] [facility]: [address] [place], [phone] [facility]: [address], [phone]  [facility] Doctors  [facility]): [phone] [facility]: [address] [phone]   [facility] Doctors [facility]: [address], [phone]  [facility] Family Doctors  [facility]: [phone], [phone]  Home Blood Pressure Monitoring for Patients   Your provider has recommended that you check your  blood pressure (BP) at least once a week at home. If you do not have a blood pressure cuff at home, one will be provided for you. Contact your provider if you have not received your monitor within 1 week.   Helpful Tips for Accurate Home Blood Pressure Checks  Don't smoke, exercise, or drink caffeine 30 minutes before checking your BP Use the restroom before checking your BP (a full bladder can raise your  pressure) Relax in a comfortable upright chair Feet on the ground Left arm resting comfortably on a flat surface at the level of your heart Legs uncrossed Back supported Sit quietly and don't talk Place the cuff on your bare arm Adjust snuggly, so that only two fingertips can fit between your skin and the top of the cuff Check 2 readings separated by at least one minute Keep a log of your BP readings For a visual, please reference this diagram: http://ccnc.care/bpdiagram  Provider Name: [facility]     Phone: [phone]  Zone 1: ALL CLEAR  Continue to monitor your symptoms:  BP reading is less than 140 (top number) or less than 90 (bottom number)  No right upper stomach pain No headaches or seeing spots No feeling nauseated or throwing up No swelling in face and hands  Zone 2: CAUTION Call your doctor's office for any of the following:  BP reading is greater than 140 (top number) or greater than 90 (bottom number)  Stomach pain under your ribs in the middle or right side Headaches or seeing spots Feeling nauseated or throwing up Swelling in face and hands  Zone 3: EMERGENCY  Seek immediate medical care if you have any of the following:  BP reading is greater than160 (top number) or greater than 110 (bottom number) Severe headaches not improving with Tylenol  Serious difficulty catching your breath Any worsening symptoms from Zone 2  Preterm Labor and Birth Information  The normal length of a pregnancy is 39-41 weeks. Preterm labor is when labor starts before 37 completed weeks of pregnancy. What are the risk factors for preterm labor? Preterm labor is more likely to occur in women who: Have certain infections during pregnancy such as a bladder infection, sexually transmitted infection, or infection inside the uterus (chorioamnionitis). Have a shorter-than-normal cervix. Have gone into preterm labor before. Have had surgery on their cervix. Are younger than age 53  or older than age 69. Are African American. Are pregnant with twins or multiple babies (multiple gestation). Take street drugs or smoke while pregnant. Do not gain enough weight while pregnant. Became pregnant shortly after having been pregnant. What are the symptoms of preterm labor? Symptoms of preterm labor include: Cramps similar to those that can happen during a menstrual period. The cramps may happen with diarrhea. Pain in the abdomen or lower back. Regular uterine contractions that may feel like tightening of the abdomen. A feeling of increased pressure in the pelvis. Increased watery or bloody mucus discharge from the vagina. Water breaking (ruptured amniotic sac). Why is it important to recognize signs of preterm labor? It is important to recognize signs of preterm labor because babies who are born prematurely may not be fully developed. This can put them at an increased risk for: Long-term (chronic) heart and lung problems. Difficulty immediately after birth with regulating body systems, including blood sugar, body temperature, heart rate, and breathing rate. Bleeding in the brain. Cerebral palsy. Learning difficulties. Death. These risks are highest for babies who are born before 34 weeks  of pregnancy. How is preterm labor treated? Treatment depends on the length of your pregnancy, your condition, and the health of your baby. It may involve: Having a stitch (suture) placed in your cervix to prevent your cervix from opening too early (cerclage). Taking or being given medicines, such as: Hormone medicines. These may be given early in pregnancy to help support the pregnancy. Medicine to stop contractions. Medicines to help mature the baby's lungs. These may be prescribed if the risk of delivery is high. Medicines to prevent your baby from developing cerebral palsy. If the labor happens before 34 weeks of pregnancy, you may need to stay in the hospital. What should I do if I  think I am in preterm labor? If you think that you are going into preterm labor, call your health care provider right away. How can I prevent preterm labor in future pregnancies? To increase your chance of having a full-term pregnancy: Do not use any tobacco products, such as cigarettes, chewing tobacco, and e-cigarettes. If you need help quitting, ask your health care provider. Do not use street drugs or medicines that have not been prescribed to you during your pregnancy. Talk with your health care provider before taking any herbal supplements, even if you have been taking them regularly. Make sure you gain a healthy amount of weight during your pregnancy. Watch for infection. If you think that you might have an infection, get it checked right away. Make sure to tell your health care provider if you have gone into preterm labor before. This information is not intended to replace advice given to you by your health care provider. Make sure you discuss any questions you have with your health care provider. Document Revised: 08/31/2018 Document Reviewed: 09/30/2015 Elsevier Patient Education  2020 [employer].

## 2023-09-26 NOTE — Progress Notes (Signed)
 HIGH-RISK PREGNANCY VISIT Patient name: Alison Sandoval MRN [id number]  Date of birth: 09/17/1995 Chief Complaint:   Routine Prenatal Visit  History of Present Illness:   Alison Sandoval is a 28 y.o. G69P1001 female at [redacted]w[redacted]d with an Estimated Date of Delivery: 11/15/23 being seen today for ongoing management of a high-risk pregnancy complicated by diabetes mellitus A2DM currently on metformin  500mg  BID  and PG BMI 36.    Today she reports  all sugars wnl! Some cramping . Contractions: Irritability. Vag. Bleeding: None.  Movement: Present. denies leaking of fluid.      08/15/2023   10:08 AM 05/02/2023   10:32 AM 01/03/2023    1:27 PM 04/16/2018    4:14 PM 04/16/2018    4:13 PM  Depression screen PHQ 2/9  Decreased Interest 0 0 1 1 1   Down, Depressed, Hopeless 0 0 0 1 1  PHQ - 2 Score 0 0 1 2 2   Altered sleeping 1 3 3 1    Tired, decreased energy 1 2 3 1    Change in appetite 0 2 3 1    Feeling bad or failure about yourself  0 0 1 0   Trouble concentrating 0 1 1 1    Moving slowly or fidgety/restless 0 0 0 1   Suicidal thoughts 0 0 0 0   PHQ-9 Score 2 8 12 7    Difficult doing work/chores    Somewhat difficult         05/02/2023   10:32 AM 01/03/2023    1:27 PM  GAD 7 : Generalized Anxiety Score  Nervous, Anxious, on Edge 0 3  Control/stop worrying 0 3  Worry too much - different things 1 3  Trouble relaxing 1 2  Restless 0 1  Easily annoyed or irritable 0 1  Afraid - awful might happen 1 2  Total GAD 7 Score 3 15     Review of Systems:   Pertinent items are noted in HPI Denies abnormal vaginal discharge w/ itching/odor/irritation, headaches, visual changes, shortness of breath, chest pain, abdominal pain, severe nausea/vomiting, or problems with urination or bowel movements unless otherwise stated above. Pertinent History Reviewed:  Reviewed past medical,surgical, social, obstetrical and family history.  Reviewed problem list, medications and allergies. Physical Assessment:    Vitals:   09/26/23 1513  BP: 127/74  Pulse: (!) 109  Weight: 229 lb 9.6 oz (104.1 kg)  Body mass index is 39.41 kg/m.           Physical Examination:   General appearance: alert, well appearing, and in no distress  Mental status: alert, oriented to person, place, and time  Skin: warm & dry   Extremities:      Cardiovascular: normal heart rate noted  Respiratory: normal respiratory effort, no distress  Abdomen: gravid, soft, non-tender  Pelvic: Cervical exam deferred         Fetal Status:     Movement: Present    Fetal Surveillance Testing today: NST: FHR baseline 125 bpm, Variability: moderate, Accelerations:present, Decelerations:  Absent= Cat 1/reactive Toco: none    Chaperone: N/A  No results found for this or any previous visit (from the past 24 hours).  Assessment & Plan:  High-risk pregnancy: G2P1001 at [redacted]w[redacted]d with an Estimated Date of Delivery: 11/15/23   1) A2DM, stable on MTF 500mg  BID, last EFW 57% w/ normal AFI @ 30wks  2) PGBMI 36, currently 39  3) Prev c/s> leaning towards RCS  Meds: No orders of the defined types were placed in  this encounter.   Labs/procedures today: NST  Treatment Plan:  EFW q 4-6w @ 28w   2x/wk NST or weekly bpp @ 32wks     Deliver @ 39-39.6wks:______   Reviewed: Preterm labor symptoms and general obstetric precautions including but not limited to vaginal bleeding, contractions, leaking of fluid and fetal movement were reviewed in detail with the patient.  All questions were answered. Does have home bp cuff. Office bp cuff given: not applicable. Check bp weekly, let us  know if consistently >140 and/or >90.  Follow-up: Return for As scheduled.   Future Appointments  Date Time Provider Department Center  09/29/2023  9:50 AM [facility] NURSE [facility]  10/03/2023  3:10 PM [facility] NURSE [facility]  10/03/2023  3:30 PM Wendelyn Halter, MD [facility]  10/06/2023 10:10 AM [facility] NURSE [facility]  10/10/2023  3:00 PM  [facility] US  [facility] None  10/10/2023  3:50 PM Ferd Householder, CNM [facility]  10/17/2023  3:30 PM [facility] NURSE [facility]  10/17/2023  3:50 PM Wendelyn Halter, MD [facility]  10/20/2023 10:30 AM [facility] NURSE [facility]  10/24/2023  3:10 PM [facility] NURSE [facility]  10/24/2023  3:30 PM Ferd Householder, CNM [facility]  10/27/2023 10:10 AM [facility] NURSE [facility]  10/31/2023  3:30 PM [facility] NURSE [facility]  10/31/2023  3:50 PM Ferd Householder, CNM [facility]  11/03/2023 10:30 AM [facility] NURSE [facility]  11/07/2023  3:00 PM [facility] IMG 2 [facility] None  11/07/2023  3:50 PM Keene Pastures, DO [facility]  11/14/2023  3:30 PM [facility] NURSE [facility]  11/14/2023  3:50 PM Mallorie Norrod, Ema Hands, CNM [facility]    Orders Placed This Encounter  Procedures   US  OB Follow Up   US  FETAL BPP WO NON STRESS   Ferd Householder CNM, [facility] 09/26/2023 3:52 PM

## 2023-09-29 ENCOUNTER — Ambulatory Visit: Admitting: *Deleted

## 2023-09-29 VITALS — BP 109/74 | HR 120

## 2023-09-29 DIAGNOSIS — Z3A33 33 weeks gestation of pregnancy: Secondary | ICD-10-CM | POA: Diagnosis not present

## 2023-09-29 DIAGNOSIS — O0993 Supervision of high risk pregnancy, unspecified, third trimester: Secondary | ICD-10-CM | POA: Diagnosis not present

## 2023-09-29 DIAGNOSIS — O0990 Supervision of high risk pregnancy, unspecified, unspecified trimester: Secondary | ICD-10-CM

## 2023-09-29 NOTE — Progress Notes (Signed)
   NURSE VISIT- NST  SUBJECTIVE:  Alison Sandoval is a 28 y.o. G86P1001 female at [redacted]w[redacted]d, here for a NST for pregnancy complicated by Diabetes: A2DM.  She reports active fetal movement, contractions: none, vaginal bleeding: none, membranes: intact.   OBJECTIVE:  BP 109/74   Pulse (!) 120   LMP 02/08/2023 (Exact Date) Comment: Shorter than normal  Appears well, no apparent distress  No results found for this or any previous visit (from the past 24 hours).  NST: FHR baseline 145 bpm, Variability: moderate, Accelerations:present, Decelerations:  Absent= Cat 1/reactive Toco: none   ASSESSMENT: G2P1001 at [redacted]w[redacted]d with Diabetes: A2DM NST reactive  PLAN: EFM strip reviewed by Dr. Randolm Butte   Recommendations: keep next appointment as scheduled    Kerrie Peek  09/29/2023 10:28 AM

## 2023-10-03 ENCOUNTER — Encounter: Admitting: Obstetrics & Gynecology

## 2023-10-03 ENCOUNTER — Other Ambulatory Visit

## 2023-10-06 ENCOUNTER — Other Ambulatory Visit

## 2023-10-06 ENCOUNTER — Ambulatory Visit: Admitting: Obstetrics & Gynecology

## 2023-10-06 ENCOUNTER — Encounter: Payer: Self-pay | Admitting: Obstetrics & Gynecology

## 2023-10-06 VITALS — BP 103/69 | HR 105 | Wt 236.0 lb

## 2023-10-06 DIAGNOSIS — O0993 Supervision of high risk pregnancy, unspecified, third trimester: Secondary | ICD-10-CM

## 2023-10-06 DIAGNOSIS — Z98891 History of uterine scar from previous surgery: Secondary | ICD-10-CM

## 2023-10-06 DIAGNOSIS — Z3A34 34 weeks gestation of pregnancy: Secondary | ICD-10-CM

## 2023-10-06 DIAGNOSIS — O0990 Supervision of high risk pregnancy, unspecified, unspecified trimester: Secondary | ICD-10-CM

## 2023-10-06 DIAGNOSIS — O24419 Gestational diabetes mellitus in pregnancy, unspecified control: Secondary | ICD-10-CM | POA: Diagnosis not present

## 2023-10-06 NOTE — Progress Notes (Signed)
 HIGH-RISK PREGNANCY VISIT Patient name: Alison Sandoval MRN [id number]  Date of birth: 1995/11/21 Chief Complaint:   Routine Prenatal Visit and Non-stress Test  History of Present Illness:   Alison Sandoval is a 28 y.o. G56P1001 female at [redacted]w[redacted]d with an Estimated Date of Delivery: 11/15/23 being seen today for ongoing management of a high-risk pregnancy complicated by:  -GDMA2 Well controlled with metformin   -prior C-section  Today she reports no complaints.   Contractions: Irregular.  .  Movement: Present. denies leaking of fluid.      08/15/2023   10:08 AM 05/02/2023   10:32 AM 01/03/2023    1:27 PM 04/16/2018    4:14 PM 04/16/2018    4:13 PM  Depression screen PHQ 2/9  Decreased Interest 0 0 1 1 1   Down, Depressed, Hopeless 0 0 0 1 1  PHQ - 2 Score 0 0 1 2 2   Altered sleeping 1 3 3 1    Tired, decreased energy 1 2 3 1    Change in appetite 0 2 3 1    Feeling bad or failure about yourself  0 0 1 0   Trouble concentrating 0 1 1 1    Moving slowly or fidgety/restless 0 0 0 1   Suicidal thoughts 0 0 0 0   PHQ-9 Score 2 8 12 7    Difficult doing work/chores    Somewhat difficult      Current Outpatient Medications  Medication Instructions   Accu-Chek Softclix Lancets lancets Use as instructed to check blood sugar 4 times daily   aspirin  EC 81 mg, Oral, Daily, Swallow whole.   Blood Glucose Monitoring Suppl (ACCU-CHEK GUIDE ME) w/Device KIT 1 each, Does not apply, 4 times daily   Blood Pressure Monitor MISC For regular home bp monitoring during pregnancy   ferrous sulfate  325 mg, Oral, Every other day   glucose blood test strip Use as instructed to check blood sugar four times daily   metFORMIN  (GLUCOPHAGE ) 500 mg, Oral, 2 times daily with meals   nitrofurantoin  (macrocrystal-monohydrate) (MACROBID ) 100 mg, Oral, 2 times daily   ondansetron  (ZOFRAN ) 4 mg, Oral, Every 8 hours PRN   ondansetron  (ZOFRAN ) 8 mg, Oral, 2 times daily   OVER THE COUNTER MEDICATION Nature made gummy  prenatal vitamins   senna (SENOKOT) 8.6 mg, Oral, 2 times daily PRN   terconazole  (TERAZOL 3 ) 0.8 % vaginal cream 1 applicator, Vaginal, Daily at bedtime     Review of Systems:   Pertinent items are noted in HPI Denies abnormal vaginal discharge w/ itching/odor/irritation, headaches, visual changes, shortness of breath, chest pain, abdominal pain, severe nausea/vomiting, or problems with urination or bowel movements unless otherwise stated above. Pertinent History Reviewed:  Reviewed past medical,surgical, social, obstetrical and family history.  Reviewed problem list, medications and allergies. Physical Assessment:   Vitals:   10/06/23 1030  BP: 103/69  Pulse: (!) 105  Weight: 236 lb (107 kg)  Body mass index is 40.51 kg/m.           Physical Examination:   General appearance: alert, well appearing, and in no distress  Mental status: normal mood, behavior, speech, dress, motor activity, and thought processes  Skin: warm & dry   Extremities: Edema: Trace    Cardiovascular: normal heart rate noted  Respiratory: normal respiratory effort, no distress  Abdomen: gravid, soft, non-tender  Pelvic: Cervical exam deferred         Fetal Status:     Movement: Present    Fetal Surveillance Testing today: NST  NST being performed due to GDMA   Fetal Monitoring:  Baseline: 140 bpm, Variability: moderate, Accelerations: present, The accelerations are >15 bpm and more than 2 in 20 minutes, and Decelerations: Absent     Final diagnosis:  Reactive NST      Chaperone: N/A    No results found for this or any previous visit (from the past 24 hours).   Assessment & Plan:  High-risk pregnancy: G2P1001 at [redacted]w[redacted]d with an Estimated Date of Delivery: 11/15/23   1. Supervision of high risk pregnancy, antepartum (Primary) -routine ob care  2. Gestational diabetes mellitus, class A2 Sugars controlled, continue metformin  Reactive NST Continue twice weekly testing  4. History of cesarean  section - We discussed her history of c-section. Her previous c-section was due to  non-reassuring fetal heart tones.  She has a history of  no prior successful vaginal deliveries - We discussed the risks associated with repeat c-section: bleeding, infection, injury to surrounding organs/tissues I.e. bowel/bladder, development of scar tissue, wound complications such as wound separation or infection, need for additional surgery and discussed potential for future complications such as placenta accreta spectrum  nm- We discussed the risks associated with TOLAC specifically focusing on the risk of uterine rupture. We discussed with the risk of uterine rupture that while rare it is not easily predicted, that it is a surgical emergency, and it can be potentially catastrophic for mom and baby.  - After counseling, the patient was given the opportunity to ask questions and all questions answered.  - After considering her options, she would like consider TOLAC though she not 100% sure what her plan may be  - Information provided to the patient   Meds: No orders of the defined types were placed in this encounter.   Labs/procedures today: NST  Treatment Plan:  routine OB care and as outlined above  Reviewed: Preterm labor symptoms and general obstetric precautions including but not limited to vaginal bleeding, contractions, leaking of fluid and fetal movement were reviewed in detail with the patient.  All questions were answered.   Follow-up: Return in about 2 weeks (around 10/20/2023).   Future Appointments  Date Time Provider Department Center  10/10/2023  3:00 PM [facility] - [facility]YN US  [facility] None  10/10/2023  3:50 PM Ferd Householder, CNM [facility]  10/17/2023  3:30 PM [facility] NUR[facility]  10/17/2023  3:50 PM Wendelyn Halter, MD [facility]  10/20/2023 10:30 AM [facility]  10/24/2023  3:10 PM [facility]  10/24/2023  3:30 PM Ferd Householder, CNM [facility]  10/27/2023 10:10 AM [facility]  10/31/2023  3:30 PM [facility]  10/31/2023  3:50 PM Ferd Householder, CNM [facility]  11/03/2023 10:30 AM [facility]  11/07/2023  3:00 PM [facility] 2 [facility] None  11/07/2023  3:50 PM Keene Pastures, DO [facility]  11/14/2023  3:30 PM [facility]  11/14/2023  3:50 PM Booker, Ema Hands, CNM [facility]    No orders of the defined types were placed in this encounter.   Llewellyn Schoenberger, DO Attending Obstetrician & Gynecologist, [facility] for [employer], [facility]

## 2023-10-10 ENCOUNTER — Ambulatory Visit

## 2023-10-10 ENCOUNTER — Encounter: Payer: Self-pay | Admitting: Women's Health

## 2023-10-10 ENCOUNTER — Ambulatory Visit: Admitting: Women's Health

## 2023-10-10 VITALS — BP 131/78 | HR 105 | Wt 237.0 lb

## 2023-10-10 DIAGNOSIS — Z3A34 34 weeks gestation of pregnancy: Secondary | ICD-10-CM

## 2023-10-10 DIAGNOSIS — Z6839 Body mass index (BMI) 39.0-39.9, adult: Secondary | ICD-10-CM | POA: Diagnosis not present

## 2023-10-10 DIAGNOSIS — O24419 Gestational diabetes mellitus in pregnancy, unspecified control: Secondary | ICD-10-CM | POA: Diagnosis not present

## 2023-10-10 DIAGNOSIS — O09299 Supervision of pregnancy with other poor reproductive or obstetric history, unspecified trimester: Secondary | ICD-10-CM

## 2023-10-10 DIAGNOSIS — O0993 Supervision of high risk pregnancy, unspecified, third trimester: Secondary | ICD-10-CM

## 2023-10-10 DIAGNOSIS — Z6841 Body Mass Index (BMI) 40.0 and over, adult: Secondary | ICD-10-CM | POA: Diagnosis not present

## 2023-10-10 DIAGNOSIS — O0990 Supervision of high risk pregnancy, unspecified, unspecified trimester: Secondary | ICD-10-CM

## 2023-10-10 NOTE — Progress Notes (Signed)
 US  34+6 wks,cephalic,BPP 8/8,FHR 135 bpm,posterior placenta gr 1,AFI 15 cm,EFW 2738 g 70%

## 2023-10-10 NOTE — Patient Instructions (Signed)
 Lechelle, thank you for choosing our office today! We appreciate the opportunity to meet your healthcare needs. You may receive a short survey by mail, e-mail, or through [employer]. If you are happy with your care we would appreciate if you could take just a few minutes to complete the survey questions. We read all of your comments and take your feedback very seriously. Thank you again for choosing our office.  [facility] [employer] Team at [facility]  [facility] at [facility] ([address] [place], [place] [address]) Entrance C, located off of [address] Free 24/7 valet parking   CLASSES: Go to [email or web address] to register for classes (childbirth, breastfeeding, waterbirth, infant CPR, daddy bootcamp, etc.)  Call the office [phone]) or go to [facility] if: You begin to have strong, frequent contractions Your water breaks.  Sometimes it is a big gush of fluid, sometimes it is just a trickle that keeps getting your panties wet or running down your legs You have vaginal bleeding.  It is normal to have a small amount of spotting if your cervix was checked.  You don't feel your baby moving like normal.  If you don't, get you something to eat and drink and lay down and focus on feeling your baby move.   If your baby is still not moving like normal, you should call the office or go to [facility]  Call the office [phone]) or go to [facility] hospital for these signs of pre-eclampsia: Severe headache that does not go away with Tylenol  Visual changes- seeing spots, double, blurred vision Pain under your right breast or upper abdomen that does not go away with Tums or heartburn medicine Nausea and/or vomiting Severe swelling in your hands, feet, and face   Tdap Vaccine It is recommended that you get the Tdap vaccine during the third trimester of EACH pregnancy to help protect your baby from getting pertussis (whooping cough) 27-36 weeks is the BEST time to do  this so that you can pass the protection on to your baby. During pregnancy is better than after pregnancy, but if you are unable to get it during pregnancy it will be offered at the hospital.  You can get this vaccine with us , at the health department, your family doctor, or some local pharmacies Everyone who will be around your baby should also be up-to-date on their vaccines before the baby comes. Adults (who are not pregnant) only need 1 dose of Tdap during adulthood.   [facility] Pediatricians/Family Doctors [facility]): [address] Meg Spina, [phone]           [facility]: [address], [phone]                [facility] Medicine [facility]): [address], [phone] (call to ask if accepting patients) [facility] Department: [address], [place], [phone]    [facility] Pediatricians/Family Doctors [facility]): [phone] [address], [phone] [facility]: [address] [place], [phone] [facility]: [address], [phone]  [facility] Doctors  [facility]): [phone] [facility]: [address] [phone]   [facility] Doctors [facility]: [address], [phone]  [facility] Family Doctors  [facility]: [phone], [phone]  Home Blood Pressure Monitoring for Patients   Your provider has recommended that you check your  blood pressure (BP) at least once a week at home. If you do not have a blood pressure cuff at home, one will be provided for you. Contact your provider if you have not received your monitor within 1 week.   Helpful Tips for Accurate Home Blood Pressure Checks  Don't smoke, exercise, or drink caffeine 30 minutes before checking your BP Use the restroom before checking your BP (a full bladder can raise your  pressure) Relax in a comfortable upright chair Feet on the ground Left arm resting comfortably on a flat surface at the level of your heart Legs uncrossed Back supported Sit quietly and don't talk Place the cuff on your bare arm Adjust snuggly, so that only two fingertips can fit between your skin and the top of the cuff Check 2 readings separated by at least one minute Keep a log of your BP readings For a visual, please reference this diagram: http://ccnc.care/bpdiagram  Provider Name: [facility]     Phone: [phone]  Zone 1: ALL CLEAR  Continue to monitor your symptoms:  BP reading is less than 140 (top number) or less than 90 (bottom number)  No right upper stomach pain No headaches or seeing spots No feeling nauseated or throwing up No swelling in face and hands  Zone 2: CAUTION Call your doctor's office for any of the following:  BP reading is greater than 140 (top number) or greater than 90 (bottom number)  Stomach pain under your ribs in the middle or right side Headaches or seeing spots Feeling nauseated or throwing up Swelling in face and hands  Zone 3: EMERGENCY  Seek immediate medical care if you have any of the following:  BP reading is greater than160 (top number) or greater than 110 (bottom number) Severe headaches not improving with Tylenol  Serious difficulty catching your breath Any worsening symptoms from Zone 2  Preterm Labor and Birth Information  The normal length of a pregnancy is 39-41 weeks. Preterm labor is when labor starts before 37 completed weeks of pregnancy. What are the risk factors for preterm labor? Preterm labor is more likely to occur in women who: Have certain infections during pregnancy such as a bladder infection, sexually transmitted infection, or infection inside the uterus (chorioamnionitis). Have a shorter-than-normal cervix. Have gone into preterm labor before. Have had surgery on their cervix. Are younger than age 51  or older than age 87. Are African American. Are pregnant with twins or multiple babies (multiple gestation). Take street drugs or smoke while pregnant. Do not gain enough weight while pregnant. Became pregnant shortly after having been pregnant. What are the symptoms of preterm labor? Symptoms of preterm labor include: Cramps similar to those that can happen during a menstrual period. The cramps may happen with diarrhea. Pain in the abdomen or lower back. Regular uterine contractions that may feel like tightening of the abdomen. A feeling of increased pressure in the pelvis. Increased watery or bloody mucus discharge from the vagina. Water breaking (ruptured amniotic sac). Why is it important to recognize signs of preterm labor? It is important to recognize signs of preterm labor because babies who are born prematurely may not be fully developed. This can put them at an increased risk for: Long-term (chronic) heart and lung problems. Difficulty immediately after birth with regulating body systems, including blood sugar, body temperature, heart rate, and breathing rate. Bleeding in the brain. Cerebral palsy. Learning difficulties. Death. These risks are highest for babies who are born before 34 weeks  of pregnancy. How is preterm labor treated? Treatment depends on the length of your pregnancy, your condition, and the health of your baby. It may involve: Having a stitch (suture) placed in your cervix to prevent your cervix from opening too early (cerclage). Taking or being given medicines, such as: Hormone medicines. These may be given early in pregnancy to help support the pregnancy. Medicine to stop contractions. Medicines to help mature the baby's lungs. These may be prescribed if the risk of delivery is high. Medicines to prevent your baby from developing cerebral palsy. If the labor happens before 34 weeks of pregnancy, you may need to stay in the hospital. What should I do if I  think I am in preterm labor? If you think that you are going into preterm labor, call your health care provider right away. How can I prevent preterm labor in future pregnancies? To increase your chance of having a full-term pregnancy: Do not use any tobacco products, such as cigarettes, chewing tobacco, and e-cigarettes. If you need help quitting, ask your health care provider. Do not use street drugs or medicines that have not been prescribed to you during your pregnancy. Talk with your health care provider before taking any herbal supplements, even if you have been taking them regularly. Make sure you gain a healthy amount of weight during your pregnancy. Watch for infection. If you think that you might have an infection, get it checked right away. Make sure to tell your health care provider if you have gone into preterm labor before. This information is not intended to replace advice given to you by your health care provider. Make sure you discuss any questions you have with your health care provider. Document Revised: 08/31/2018 Document Reviewed: 09/30/2015 Elsevier Patient Education  2020 [employer].

## 2023-10-10 NOTE — Progress Notes (Signed)
 HIGH-RISK PREGNANCY VISIT Patient name: Alison Sandoval MRN [id number]  Date of birth: 12-Sep-1995 Chief Complaint:   Routine Prenatal Visit  History of Present Illness:   Ceclia Sandoval is a 28 y.o. G28P1001 female at [redacted]w[redacted]d with an Estimated Date of Delivery: 11/15/23 being seen today for ongoing management of a high-risk pregnancy complicated by diabetes mellitus A2DM currently on metformin  500mg  bID and PG BMI 36.    Today she reports all sugars wnl, legs swelling. Contractions: Not present. Vag. Bleeding: None.  Movement: Present. denies leaking of fluid.      08/15/2023   10:08 AM 05/02/2023   10:32 AM 01/03/2023    1:27 PM 04/16/2018    4:14 PM 04/16/2018    4:13 PM  Depression screen PHQ 2/9  Decreased Interest 0 0 1 1 1   Down, Depressed, Hopeless 0 0 0 1 1  PHQ - 2 Score 0 0 1 2 2   Altered sleeping 1 3 3 1    Tired, decreased energy 1 2 3 1    Change in appetite 0 2 3 1    Feeling bad or failure about yourself  0 0 1 0   Trouble concentrating 0 1 1 1    Moving slowly or fidgety/restless 0 0 0 1   Suicidal thoughts 0 0 0 0   PHQ-9 Score 2 8 12 7    Difficult doing work/chores    Somewhat difficult         05/02/2023   10:32 AM 01/03/2023    1:27 PM  GAD 7 : Generalized Anxiety Score  Nervous, Anxious, on Edge 0 3  Control/stop worrying 0 3  Worry too much - different things 1 3  Trouble relaxing 1 2  Restless 0 1  Easily annoyed or irritable 0 1  Afraid - awful might happen 1 2  Total GAD 7 Score 3 15     Review of Systems:   Pertinent items are noted in HPI Denies abnormal vaginal discharge w/ itching/odor/irritation, headaches, visual changes, shortness of breath, chest pain, abdominal pain, severe nausea/vomiting, or problems with urination or bowel movements unless otherwise stated above. Pertinent History Reviewed:  Reviewed past medical,surgical, social, obstetrical and family history.  Reviewed problem list, medications and allergies. Physical Assessment:    Vitals:   10/10/23 1621  BP: 131/78  Pulse: (!) 105  Weight: 237 lb (107.5 kg)  Body mass index is 40.68 kg/m.           Physical Examination:   General appearance: alert, well appearing, and in no distress  Mental status: alert, oriented to person, place, and time  Skin: warm & dry   Extremities: Edema: None    Cardiovascular: normal heart rate noted  Respiratory: normal respiratory effort, no distress  Abdomen: gravid, soft, non-tender  Pelvic: Cervical exam deferred         Fetal Status:     Movement: Present    Fetal Surveillance Testing today: US  34+6 wks,cephalic,BPP 8/8,FHR 135 bpm,posterior placenta gr 1,AFI 15 cm,EFW 2738 g 70%   Chaperone: N/A  No results found for this or any previous visit (from the past 24 hours).  Assessment & Plan:  High-risk pregnancy: G2P1001 at [redacted]w[redacted]d with an Estimated Date of Delivery: 11/15/23   1) A2DM, stable on metformin  500mg  BID, EFW today 70% w/ normal AFI  2) PGBMI 36, currently 40  3) Prev c/s> plans TOLAC, consent signed 5/16  Meds: No orders of the defined types were placed in this encounter.   Labs/procedures today:  U/S  Treatment Plan:   EFW q 4w    2x/wk NST or weekly bpp @ 32wks     Deliver @ 39-39.6wks:______   Reviewed: Preterm labor symptoms and general obstetric precautions including but not limited to vaginal bleeding, contractions, leaking of fluid and fetal movement were reviewed in detail with the patient.  All questions were answered. Does have home bp cuff. Office bp cuff given: not applicable. Check bp every couple of days, let us  know if consistently >140 and/or >90, reviewed pre-e s/s, reasons to seek care.  Follow-up: Return for As scheduled.   Future Appointments  Date Time Provider Department Center  10/17/2023  3:30 PM [facility] N[facility]  10/17/2023  3:50 PM Wendelyn Halter, MD [facility]  10/20/2023 10:30 AM [facility]  10/24/2023  3:10 PM [facility]  [facility]  10/24/2023  3:30 PM Ferd Householder, CNM [facility]  10/27/2023 10:10 AM [facility]  10/31/2023  3:30 PM [facility]  10/31/2023  3:50 PM Ferd Householder, CNM [facility]  11/03/2023 10:30 AM [facility]  11/07/2023  3:00 PM [facility]G 2 [facility] None  11/07/2023  3:50 PM Keene Pastures, DO [facility]  11/14/2023  3:30 PM [facility]  11/14/2023  3:50 PM Shalen Petrak, Ema Hands, CNM [facility]    No orders of the defined types were placed in this encounter.  Ferd Householder CNM, [facility] 10/10/2023 4:41 PM

## 2023-10-15 ENCOUNTER — Encounter: Payer: Self-pay | Admitting: Family Medicine

## 2023-10-15 ENCOUNTER — Inpatient Hospital Stay (HOSPITAL_COMMUNITY)
Admission: AD | Admit: 2023-10-15 | Discharge: 2023-10-15 | Disposition: A | Discharge status: Home / Self Care | Attending: Family Medicine | Admitting: Family Medicine

## 2023-10-15 ENCOUNTER — Encounter (HOSPITAL_COMMUNITY): Payer: Self-pay | Admitting: Family Medicine

## 2023-10-15 DIAGNOSIS — O26893 Other specified pregnancy related conditions, third trimester: Secondary | ICD-10-CM | POA: Insufficient documentation

## 2023-10-15 DIAGNOSIS — O368130 Decreased fetal movements, third trimester, not applicable or unspecified: Secondary | ICD-10-CM | POA: Diagnosis not present

## 2023-10-15 DIAGNOSIS — O0990 Supervision of high risk pregnancy, unspecified, unspecified trimester: Secondary | ICD-10-CM

## 2023-10-15 DIAGNOSIS — Z3A35 35 weeks gestation of pregnancy: Secondary | ICD-10-CM

## 2023-10-15 DIAGNOSIS — R030 Elevated blood-pressure reading, without diagnosis of hypertension: Secondary | ICD-10-CM | POA: Diagnosis not present

## 2023-10-15 DIAGNOSIS — O09299 Supervision of pregnancy with other poor reproductive or obstetric history, unspecified trimester: Secondary | ICD-10-CM

## 2023-10-15 DIAGNOSIS — Z3689 Encounter for other specified antenatal screening: Secondary | ICD-10-CM

## 2023-10-15 LAB — GLUCOSE, CAPILLARY: Glucose-Capillary: 83 mg/dL (ref 70–99)

## 2023-10-15 NOTE — MAU Note (Signed)
..  Alison Sandoval is a 28 y.o. at [redacted]w[redacted]d here in MAU reporting: Hypertension (130s/90s) at home, with headache, increased swelling, and sparkles in her vision. Denies RUQ pain.   Not on blood pressure medications, hasn't taken anything today for HA (just started an hour ago).  Also reporting decreased fetal movement.    Also feeling some cramping which has been getting worse. No leaking of fluid. No bleeding. Some increased discharge.   Also concerned about "frothy" urine that is sometimes dark in color.   Pain score: 4/10 Vitals:   10/15/23 2058  BP: 123/70  Pulse: (!) 121  Resp: 18  Temp: 97.7 F (36.5 C)     FHT:150 Lab orders placed from triage:  UA

## 2023-10-15 NOTE — MAU Provider Note (Signed)
 History     CSN: [id number]  Arrival date and time: 10/15/23 2004   Event Date/Time   First Provider Initiated Contact with Patient 10/15/23 2158      Chief Complaint  Patient presents with   Hypertension    130s over 90s at home   Alison Sandoval is a 28 y.o. G2P1001 at [redacted]w[redacted]d who receives care at [facility].  She presents today for elevated bp and DFM.  Patient states she has been checking her bp weekly and uploading it.  She states today it was 130/95.  She denies HA, visual disturbances, or RUQ pain.  Patient also reports DFM since 1400.  She states she noticed that she hadn't felt movement and poked her stomach to promote movement.  She endorses current movement.  She reports eating salad around 1600. She reports some lower abdominal soreness and cramping for the past few days. She states the cramping is similar to RLP.   OB History     Gravida  2   Para  1   Term  1   Preterm      AB      Living  1      SAB      IAB      Ectopic      Multiple  0   Live Births  1           Past Medical History:  Diagnosis Date   Chronic kidney disease    UTI   Diabetes mellitus without complication (HCC)    GERD (gastroesophageal reflux disease)    Gestational diabetes    Headache     Past Surgical History:  Procedure Laterality Date   CESAREAN SECTION N/A 06/16/2018   Procedure: CESAREAN SECTION;  Surgeon: Verlyn Goad, MD;  Location: [facility];  Service: Obstetrics;  Laterality: N/A;    Family History  Problem Relation Age of Onset   Hypertension Paternal Grandmother    Hyperlipidemia Maternal Grandmother    Hypertension Father    Hyperlipidemia Father    Hypertension Mother    Asthma Mother     Social History   Tobacco Use   Smoking status: Never   Smokeless tobacco: Never  Vaping Use   Vaping status: Former  Substance Use Topics   Alcohol use: Not Currently    Comment: social   Drug use: Not Currently    Types: Marijuana    Comment:  before pregnancy    Allergies:  Allergies  Allergen Reactions   Caramel Rash    Medications Prior to Admission  Medication Sig Dispense Refill Last Dose/Taking   aspirin  EC 81 MG tablet Take 1 tablet (81 mg total) by mouth daily. Swallow whole. 90 tablet 3 10/14/2023 Morning   metFORMIN  (GLUCOPHAGE ) 500 MG tablet Take 1 tablet (500 mg total) by mouth 2 (two) times daily with a meal. 60 tablet 3 10/15/2023 Morning   Prenatal Vit-Fe Fumarate-FA (PRENATAL MULTIVITAMIN) TABS tablet Take 1 tablet by mouth daily at 12 noon.   10/14/2023 Morning   Accu-Chek Softclix Lancets lancets Use as instructed to check blood sugar 4 times daily 100 each 12    Blood Glucose Monitoring Suppl (ACCU-CHEK GUIDE ME) w/Device KIT 1 each by Does not apply route 4 (four) times daily. 1 kit 0    Blood Pressure Monitor MISC For regular home bp monitoring during pregnancy 1 each 0    ferrous sulfate  325 (65 FE) MG tablet Take 1 tablet (325 mg total) by mouth every  other day. (Patient not taking: Reported on 10/10/2023) 15 tablet 0    glucose blood test strip Use as instructed to check blood sugar four times daily 100 each 12    nitrofurantoin , macrocrystal-monohydrate, (MACROBID ) 100 MG capsule Take 1 capsule (100 mg total) by mouth 2 (two) times daily. (Patient not taking: Reported on 09/12/2023) 10 capsule 0    ondansetron  (ZOFRAN ) 4 MG tablet Take 1 tablet (4 mg total) by mouth every 8 (eight) hours as needed. (Patient not taking: Reported on 10/10/2023) 20 tablet 1    ondansetron  (ZOFRAN ) 4 MG tablet Take 2 tablets (8 mg total) by mouth 2 (two) times daily. (Patient not taking: Reported on 10/10/2023) 20 tablet 0    OVER THE COUNTER MEDICATION Nature made gummy prenatal vitamins      senna (SENOKOT) 8.6 MG TABS tablet Take 1 tablet (8.6 mg total) by mouth 2 (two) times daily as needed for moderate constipation. 120 tablet 0     Review of Systems  Gastrointestinal:  Positive for abdominal pain (soreness and cramping).  Negative for constipation, diarrhea, nausea and vomiting.  Genitourinary:  Negative for difficulty urinating, dysuria, vaginal bleeding and vaginal discharge.   Physical Exam   Blood pressure 127/71, pulse 97, temperature 97.7 F (36.5 C), temperature source Oral, resp. rate 18, height 5\' 4"  (1.626 m), weight 104.3 kg, last menstrual period 02/08/2023, SpO2 100%.  Vitals:   10/15/23 2058 10/15/23 2115 10/15/23 2130 10/15/23 2145  BP: 123/70  117/72 127/71  Pulse: (!) 121  94 97  Resp: 18  18 18   Temp: 97.7 F (36.5 C)     TempSrc: Oral     SpO2:  100% 100% 100%  Weight:      Height:        Physical Exam Vitals and nursing note reviewed.  Constitutional:      Appearance: Normal appearance.  HENT:     Head: Normocephalic and atraumatic.  Eyes:     Conjunctiva/sclera: Conjunctivae normal.  Cardiovascular:     Rate and Rhythm: Normal rate.  Pulmonary:     Effort: Pulmonary effort is normal. No respiratory distress.  Abdominal:     Palpations: Abdomen is soft.     Tenderness: There is abdominal tenderness in the right upper quadrant, right lower quadrant, left upper quadrant and left lower quadrant.  Musculoskeletal:        General: Normal range of motion.     Cervical back: Normal range of motion.  Skin:    General: Skin is warm and dry.  Neurological:     Mental Status: She is alert and oriented to person, place, and time.  Psychiatric:        Mood and Affect: Mood normal.        Behavior: Behavior normal.     Fetal Assessment 135 bpm, Mod Var, -Decels, +15x15 Accels Toco: None  MAU Course   Results for orders placed or performed during the hospital encounter of 10/15/23 (from the past 24 hours)  Glucose, capillary     Status: None   Collection Time: 10/15/23  9:20 PM  Result Value Ref Range   Glucose-Capillary 83 70 - 99 mg/dL   No results found.   MDM PE Labs:CBG EFM  Assessment and Plan   28 year old G2P1001  SIUP at 35.4 weeks Cat I  FT DFM Elevated BP at home  -POC Reviewed.  -Initially fetal tracing with minimal variability, but greatly improved with position change.  -Patient endorses fetal movement.  -  Reviewed normal blood pressures since arrival.  -Reviewed babyscript data and bp taken twice.  Patient reports she retook it because she didn't feel well. *1240: 128/76 *1802: 125/95 -Plan to defer labs currently as no other elevations and no history.  -Instructed to continue to monitor home bps and document in babyscripts. Information included in AVS.  -Exam performed. -Precautions reviewed.  -Encouraged to call primary office or return to MAU if symptoms worsen or with the onset of new symptoms. -Discharged to home in stable condition.  Kraig Peru MSN, CNM 10/15/2023, 9:58 PM

## 2023-10-17 ENCOUNTER — Other Ambulatory Visit

## 2023-10-17 ENCOUNTER — Encounter: Payer: Self-pay | Admitting: Women's Health

## 2023-10-17 ENCOUNTER — Ambulatory Visit (INDEPENDENT_AMBULATORY_CARE_PROVIDER_SITE_OTHER): Admitting: Women's Health

## 2023-10-17 VITALS — BP 114/77 | HR 118 | Wt 238.4 lb

## 2023-10-17 DIAGNOSIS — Z3A35 35 weeks gestation of pregnancy: Secondary | ICD-10-CM

## 2023-10-17 DIAGNOSIS — O0993 Supervision of high risk pregnancy, unspecified, third trimester: Secondary | ICD-10-CM | POA: Diagnosis not present

## 2023-10-17 DIAGNOSIS — O24419 Gestational diabetes mellitus in pregnancy, unspecified control: Secondary | ICD-10-CM | POA: Diagnosis not present

## 2023-10-17 DIAGNOSIS — O0990 Supervision of high risk pregnancy, unspecified, unspecified trimester: Secondary | ICD-10-CM

## 2023-10-17 NOTE — Progress Notes (Signed)
 HIGH-RISK PREGNANCY VISIT Patient name: Alison Sandoval MRN [id number]  Date of birth: 10-Jan-1996 Chief Complaint:   Routine Prenatal Visit  History of Present Illness:   Alison Sandoval is a 28 y.o. G53P1001 female at [redacted]w[redacted]d with an Estimated Date of Delivery: 11/15/23 being seen today for ongoing management of a high-risk pregnancy complicated by diabetes mellitus A2DM currently on metformin  500mg  BID and PG BMI 36.    Today she reports didn't bring log, reports all sugars wnl. Contractions: Not present. Vag. Bleeding: None.  Movement: Present. denies leaking of fluid.      08/15/2023   10:08 AM 05/02/2023   10:32 AM 01/03/2023    1:27 PM 04/16/2018    4:14 PM 04/16/2018    4:13 PM  Depression screen PHQ 2/9  Decreased Interest 0 0 1 1 1   Down, Depressed, Hopeless 0 0 0 1 1  PHQ - 2 Score 0 0 1 2 2   Altered sleeping 1 3 3 1    Tired, decreased energy 1 2 3 1    Change in appetite 0 2 3 1    Feeling bad or failure about yourself  0 0 1 0   Trouble concentrating 0 1 1 1    Moving slowly or fidgety/restless 0 0 0 1   Suicidal thoughts 0 0 0 0   PHQ-9 Score 2 8 12 7    Difficult doing work/chores    Somewhat difficult         05/02/2023   10:32 AM 01/03/2023    1:27 PM  GAD 7 : Generalized Anxiety Score  Nervous, Anxious, on Edge 0 3  Control/stop worrying 0 3  Worry too much - different things 1 3  Trouble relaxing 1 2  Restless 0 1  Easily annoyed or irritable 0 1  Afraid - awful might happen 1 2  Total GAD 7 Score 3 15     Review of Systems:   Pertinent items are noted in HPI Denies abnormal vaginal discharge w/ itching/odor/irritation, headaches, visual changes, shortness of breath, chest pain, abdominal pain, severe nausea/vomiting, or problems with urination or bowel movements unless otherwise stated above. Pertinent History Reviewed:  Reviewed past medical,surgical, social, obstetrical and family history.  Reviewed problem list, medications and allergies. Physical  Assessment:   Vitals:   10/17/23 1543  BP: 114/77  Pulse: (!) 118  Weight: 238 lb 6.4 oz (108.1 kg)  Body mass index is 40.92 kg/m.           Physical Examination:   General appearance: alert, well appearing, and in no distress  Mental status: alert, oriented to person, place, and time  Skin: warm & dry   Extremities:      Cardiovascular: normal heart rate noted  Respiratory: normal respiratory effort, no distress  Abdomen: gravid, soft, non-tender  Pelvic: Cervical exam deferred         Fetal Status:     Movement: Present    Fetal Surveillance Testing today: NST: FHR baseline 130 bpm, Variability: moderate, Accelerations:present, Decelerations:  Absent= Cat 1/reactive Toco: none    Chaperone: N/A  No results found for this or any previous visit (from the past 24 hours).  Assessment & Plan:  High-risk pregnancy: G2P1001 at [redacted]w[redacted]d with an Estimated Date of Delivery: 11/15/23   1) A2DM, stable, EFW 70% w/ normal AFI at 34.6w  2) PGBMI 36, currently 40  3) Prev c/s> wants TOLAC (consent 5/16)  Meds: No orders of the defined types were placed in this encounter.  Labs/procedures today: NST  Treatment Plan:  EFW q 4w   2x/wk NST or weekly bpp    Deliver @ 39-39.6wks:______   Reviewed: Preterm labor symptoms and general obstetric precautions including but not limited to vaginal bleeding, contractions, leaking of fluid and fetal movement were reviewed in detail with the patient.  All questions were answered. Does have home bp cuff. Office bp cuff given: not applicable. Check bp weekly, let us  know if consistently >140 and/or >90.  Follow-up: Return for As scheduled.   Future Appointments  Date Time Provider Department Center  10/20/2023 10:30 AM [facility] NUR[facility]  10/24/2023  3:10 PM [facility]  10/24/2023  3:30 PM Ferd Householder, CNM [facility]  10/27/2023 10:10 AM [facility]  10/31/2023  3:30 PM [facility]  NURSE [facility]  10/31/2023  3:50 PM Ferd Householder, CNM [facility]  11/03/2023 10:30 AM [facility] N[facility]  11/07/2023  3:00 PM [facility] None  11/07/2023  3:50 PM Keene Pastures, DO [facility]  11/14/2023  3:30 PM [facility] N[facility]  11/14/2023  3:50 PM Windy Dudek, Ema Hands, CNM [facility]    No orders of the defined types were placed in this encounter.  Ferd Householder CNM, [facility] 10/17/2023 4:11 PM

## 2023-10-17 NOTE — Patient Instructions (Signed)
 Alison Sandoval, thank you for choosing our office today! We appreciate the opportunity to meet your healthcare needs. You may receive a short survey by mail, e-mail, or through [employer]. If you are happy with your care we would appreciate if you could take just a few minutes to complete the survey questions. We read all of your comments and take your feedback very seriously. Thank you again for choosing our office.  Center for [employer] Team at [facility]  [facility] at [facility] ([address] [place], [place] [address]) Entrance C, located off of [address] Free 24/7 valet parking   CLASSES: Go to [email or web address] to register for classes (childbirth, breastfeeding, waterbirth, infant CPR, daddy bootcamp, etc.)  Call the office [phone]) or go to [facility] if: You begin to have strong, frequent contractions Your water breaks.  Sometimes it is a big gush of fluid, sometimes it is just a trickle that keeps getting your panties wet or running down your legs You have vaginal bleeding.  It is normal to have a small amount of spotting if your cervix was checked.  You don't feel your baby moving like normal.  If you don't, get you something to eat and drink and lay down and focus on feeling your baby move.   If your baby is still not moving like normal, you should call the office or go to [facility]  Call the office [phone]) or go to [facility] hospital for these signs of pre-eclampsia: Severe headache that does not go away with Tylenol  Visual changes- seeing spots, double, blurred vision Pain under your right breast or upper abdomen that does not go away with Tums or heartburn medicine Nausea and/or vomiting Severe swelling in your hands, feet, and face   Tdap Vaccine It is recommended that you get the Tdap vaccine during the third trimester of EACH pregnancy to help protect your baby from getting pertussis (whooping cough) 27-36 weeks is the BEST time to do  this so that you can pass the protection on to your baby. During pregnancy is better than after pregnancy, but if you are unable to get it during pregnancy it will be offered at the hospital.  You can get this vaccine with us , at the health department, your family doctor, or some local pharmacies Everyone who will be around your baby should also be up-to-date on their vaccines before the baby comes. Adults (who are not pregnant) only need 1 dose of Tdap during adulthood.   [facility] Pediatricians/Family Doctors [facility]): [address] Meg Spina, [phone]           [facility]: [address], [phone]                [facility] Medicine [facility]): [address], [phone] (call to ask if accepting patients) [facility] Department: [address], [place], [phone]    [facility] Pediatricians/Family Doctors [facility]): [phone] [address], [phone] [facility]: [address] [place], [phone] [facility]: [address], [phone]  [facility] Doctors  [facility]): [phone] [facility]: [address] [phone]   [facility] Doctors [facility]: [address], [phone]  [facility] Family Doctors  [facility]: [phone], [phone]  Home Blood Pressure Monitoring for Patients   Your provider has recommended that you check your  blood pressure (BP) at least once a week at home. If you do not have a blood pressure cuff at home, one will be provided for you. Contact your provider if you have not received your monitor within 1 week.   Helpful Tips for Accurate Home Blood Pressure Checks  Don't smoke, exercise, or drink caffeine 30 minutes before checking your BP Use the restroom before checking your BP (a full bladder can raise your  pressure) Relax in a comfortable upright chair Feet on the ground Left arm resting comfortably on a flat surface at the level of your heart Legs uncrossed Back supported Sit quietly and don't talk Place the cuff on your bare arm Adjust snuggly, so that only two fingertips can fit between your skin and the top of the cuff Check 2 readings separated by at least one minute Keep a log of your BP readings For a visual, please reference this diagram: http://ccnc.care/bpdiagram  Provider Name: [facility]     Phone: [phone]  Zone 1: ALL CLEAR  Continue to monitor your symptoms:  BP reading is less than 140 (top number) or less than 90 (bottom number)  No right upper stomach pain No headaches or seeing spots No feeling nauseated or throwing up No swelling in face and hands  Zone 2: CAUTION Call your doctor's office for any of the following:  BP reading is greater than 140 (top number) or greater than 90 (bottom number)  Stomach pain under your ribs in the middle or right side Headaches or seeing spots Feeling nauseated or throwing up Swelling in face and hands  Zone 3: EMERGENCY  Seek immediate medical care if you have any of the following:  BP reading is greater than160 (top number) or greater than 110 (bottom number) Severe headaches not improving with Tylenol  Serious difficulty catching your breath Any worsening symptoms from Zone 2  Preterm Labor and Birth Information  The normal length of a pregnancy is 39-41 weeks. Preterm labor is when labor starts before 37 completed weeks of pregnancy. What are the risk factors for preterm labor? Preterm labor is more likely to occur in women who: Have certain infections during pregnancy such as a bladder infection, sexually transmitted infection, or infection inside the uterus (chorioamnionitis). Have a shorter-than-normal cervix. Have gone into preterm labor before. Have had surgery on their cervix. Are younger than age 51  or older than age 87. Are African American. Are pregnant with twins or multiple babies (multiple gestation). Take street drugs or smoke while pregnant. Do not gain enough weight while pregnant. Became pregnant shortly after having been pregnant. What are the symptoms of preterm labor? Symptoms of preterm labor include: Cramps similar to those that can happen during a menstrual period. The cramps may happen with diarrhea. Pain in the abdomen or lower back. Regular uterine contractions that may feel like tightening of the abdomen. A feeling of increased pressure in the pelvis. Increased watery or bloody mucus discharge from the vagina. Water breaking (ruptured amniotic sac). Why is it important to recognize signs of preterm labor? It is important to recognize signs of preterm labor because babies who are born prematurely may not be fully developed. This can put them at an increased risk for: Long-term (chronic) heart and lung problems. Difficulty immediately after birth with regulating body systems, including blood sugar, body temperature, heart rate, and breathing rate. Bleeding in the brain. Cerebral palsy. Learning difficulties. Death. These risks are highest for babies who are born before 34 weeks  of pregnancy. How is preterm labor treated? Treatment depends on the length of your pregnancy, your condition, and the health of your baby. It may involve: Having a stitch (suture) placed in your cervix to prevent your cervix from opening too early (cerclage). Taking or being given medicines, such as: Hormone medicines. These may be given early in pregnancy to help support the pregnancy. Medicine to stop contractions. Medicines to help mature the baby's lungs. These may be prescribed if the risk of delivery is high. Medicines to prevent your baby from developing cerebral palsy. If the labor happens before 34 weeks of pregnancy, you may need to stay in the hospital. What should I do if I  think I am in preterm labor? If you think that you are going into preterm labor, call your health care provider right away. How can I prevent preterm labor in future pregnancies? To increase your chance of having a full-term pregnancy: Do not use any tobacco products, such as cigarettes, chewing tobacco, and e-cigarettes. If you need help quitting, ask your health care provider. Do not use street drugs or medicines that have not been prescribed to you during your pregnancy. Talk with your health care provider before taking any herbal supplements, even if you have been taking them regularly. Make sure you gain a healthy amount of weight during your pregnancy. Watch for infection. If you think that you might have an infection, get it checked right away. Make sure to tell your health care provider if you have gone into preterm labor before. This information is not intended to replace advice given to you by your health care provider. Make sure you discuss any questions you have with your health care provider. Document Revised: 08/31/2018 Document Reviewed: 09/30/2015 Elsevier Patient Education  2020 [employer].

## 2023-10-20 ENCOUNTER — Ambulatory Visit: Admitting: *Deleted

## 2023-10-20 VITALS — BP 112/75 | HR 103 | Wt 240.0 lb

## 2023-10-20 DIAGNOSIS — Z3A36 36 weeks gestation of pregnancy: Secondary | ICD-10-CM

## 2023-10-20 DIAGNOSIS — Z6836 Body mass index (BMI) 36.0-36.9, adult: Secondary | ICD-10-CM | POA: Diagnosis not present

## 2023-10-20 DIAGNOSIS — O24415 Gestational diabetes mellitus in pregnancy, controlled by oral hypoglycemic drugs: Secondary | ICD-10-CM | POA: Diagnosis not present

## 2023-10-20 NOTE — Progress Notes (Signed)
   NURSE VISIT- NST  SUBJECTIVE:  Alison Sandoval is a 28 y.o. G49P1001 female at [redacted]w[redacted]d, here for a NST for pregnancy complicated by Diabetes: A2DM and morbid obesity.  She reports active fetal movement, contractions: none, vaginal bleeding: none, membranes: intact.   OBJECTIVE:  BP 112/75   Pulse (!) 103   Wt 240 lb (108.9 kg)   LMP 02/08/2023 (Exact Date) Comment: Shorter than normal  BMI 41.20 kg/m   Appears well, no apparent distress  No results found for this or any previous visit (from the past 24 hours).  NST: FHR baseline 125 bpm, Variability: moderate, Accelerations:present, Decelerations:  Absent= Cat 1/reactive Toco: none   ASSESSMENT: G2P1001 at [redacted]w[redacted]d with Diabetes: A2DM NST reactive  PLAN: EFM strip reviewed by Dr. Randolm Butte   Recommendations: keep next appointment as scheduled    Laverne Potter  10/20/2023 11:38 AM

## 2023-10-24 ENCOUNTER — Other Ambulatory Visit

## 2023-10-24 ENCOUNTER — Encounter: Payer: Self-pay | Admitting: Women's Health

## 2023-10-24 ENCOUNTER — Ambulatory Visit (INDEPENDENT_AMBULATORY_CARE_PROVIDER_SITE_OTHER): Admitting: Women's Health

## 2023-10-24 ENCOUNTER — Other Ambulatory Visit (HOSPITAL_COMMUNITY)
Admission: RE | Admit: 2023-10-24 | Discharge: 2023-10-24 | Disposition: A | Discharge status: Home / Self Care | Source: Ambulatory Visit | Attending: Women's Health | Admitting: Women's Health

## 2023-10-24 VITALS — BP 121/78 | HR 111 | Wt 235.0 lb

## 2023-10-24 DIAGNOSIS — O24415 Gestational diabetes mellitus in pregnancy, controlled by oral hypoglycemic drugs: Secondary | ICD-10-CM | POA: Diagnosis not present

## 2023-10-24 DIAGNOSIS — Z113 Encounter for screening for infections with a predominantly sexual mode of transmission: Secondary | ICD-10-CM

## 2023-10-24 DIAGNOSIS — O0993 Supervision of high risk pregnancy, unspecified, third trimester: Secondary | ICD-10-CM | POA: Diagnosis not present

## 2023-10-24 DIAGNOSIS — Z3A36 36 weeks gestation of pregnancy: Secondary | ICD-10-CM

## 2023-10-24 LAB — POCT URINALYSIS DIPSTICK OB
Blood, UA: NEGATIVE
Glucose, UA: NEGATIVE
Ketones, UA: NEGATIVE
Nitrite, UA: NEGATIVE
POC,PROTEIN,UA: NEGATIVE

## 2023-10-24 NOTE — Patient Instructions (Signed)
 Alison Sandoval, thank you for choosing our office today! We appreciate the opportunity to meet your healthcare needs. You may receive a short survey by mail, e-mail, or through [employer]. If you are happy with your care we would appreciate if you could take just a few minutes to complete the survey questions. We read all of your comments and take your feedback very seriously. Thank you again for choosing our office.  Center for [employer] Team at [facility]  [facility] Center at [facility] ([address] [place], [place] [address]) Entrance C, located off of [address] Free 24/7 valet parking   CLASSES: Go to [email or web address] to register for classes (childbirth, breastfeeding, waterbirth, infant CPR, daddy bootcamp, etc.)  Call the office [phone]) or go to [facility] if: You begin to have strong, frequent contractions Your water breaks.  Sometimes it is a big gush of fluid, sometimes it is just a trickle that keeps getting your panties wet or running down your legs You have vaginal bleeding.  It is normal to have a small amount of spotting if your cervix was checked.  You don't feel your baby moving like normal.  If you don't, get you something to eat and drink and lay down and focus on feeling your baby move.   If your baby is still not moving like normal, you should call the office or go to [facility]  Call the office [phone]) or go to [facility] hospital for these signs of pre-eclampsia: Severe headache that does not go away with Tylenol  Visual changes- seeing spots, double, blurred vision Pain under your right breast or upper abdomen that does not go away with Tums or heartburn medicine Nausea and/or vomiting Severe swelling in your hands, feet, and face   [place] Pediatricians/Family Doctors [facility]): [address] Meg Spina, [phone]           [facility]: [address], [phone]                 [facility]): [address], [phone] (call to ask if accepting patients) [facility] Department: [address], [place], [phone]    [facility] Pediatricians/Family Doctors [facility]): [address], [phone] [facility]: [address] [place], [phone] [facility]: [address], [phone]  [facility] Doctors  [facility]): [phone] [facility]: [address] [phone]   [facility] Doctors [facility]: [address], [phone]  [facility] Doctors  [facility]: [phone], [phone]  Home Blood Pressure Monitoring for Patients   Your provider has recommended that you check your blood pressure (BP) at least once a week at home. If you do not have a blood pressure cuff at home, one will be provided for you. Contact your provider if you have not received your monitor within 1 week.   Helpful Tips for Accurate Home Blood Pressure Checks  Don't smoke, exercise, or drink caffeine 30 minutes before checking your BP Use the restroom before checking your BP (a full bladder can raise your pressure) Relax in a comfortable upright chair Feet on the ground Left arm resting comfortably on a flat surface at the level of your heart Legs uncrossed Back supported Sit quietly and don't talk Place the cuff on your bare arm Adjust snuggly, so that only two fingertips  can fit between your skin and the top of the cuff Check 2 readings separated by at least one minute Keep a log of your BP readings For a visual, please reference this diagram: http://ccnc.care/bpdiagram  Provider Name: [facility]     Phone: [phone]  Zone 1: ALL CLEAR  Continue to monitor your symptoms:  BP reading is less than 140 (top number) or less than 90 (bottom number)  No right  upper stomach pain No headaches or seeing spots No feeling nauseated or throwing up No swelling in face and hands  Zone 2: CAUTION Call your doctor's office for any of the following:  BP reading is greater than 140 (top number) or greater than 90 (bottom number)  Stomach pain under your ribs in the middle or right side Headaches or seeing spots Feeling nauseated or throwing up Swelling in face and hands  Zone 3: EMERGENCY  Seek immediate medical care if you have any of the following:  BP reading is greater than160 (top number) or greater than 110 (bottom number) Severe headaches not improving with Tylenol  Serious difficulty catching your breath Any worsening symptoms from Zone 2   Braxton Hicks Contractions Contractions of the uterus can occur throughout pregnancy, but they are not always a sign that you are in labor. You may have practice contractions called Braxton Hicks contractions. These false labor contractions are sometimes confused with true labor. What are Glover Larve contractions? Braxton Hicks contractions are tightening movements that occur in the muscles of the uterus before labor. Unlike true labor contractions, these contractions do not result in opening (dilation) and thinning of the cervix. Toward the end of pregnancy (32-34 weeks), Braxton Hicks contractions can happen more often and may become stronger. These contractions are sometimes difficult to tell apart from true labor because they can be very uncomfortable. You should not feel embarrassed if you go to the hospital with false labor. Sometimes, the only way to tell if you are in true labor is for your health care provider to look for changes in the cervix. The health care provider will do a physical exam and may monitor your contractions. If you are not in true labor, the exam should show that your cervix is not dilating and your water has not broken. If there are no other health problems associated with your  pregnancy, it is completely safe for you to be sent home with false labor. You may continue to have Braxton Hicks contractions until you go into true labor. How to tell the difference between true labor and false labor True labor Contractions last 30-70 seconds. Contractions become very regular. Discomfort is usually felt in the top of the uterus, and it spreads to the lower abdomen and low back. Contractions do not go away with walking. Contractions usually become more intense and increase in frequency. The cervix dilates and gets thinner. False labor Contractions are usually shorter and not as strong as true labor contractions. Contractions are usually irregular. Contractions are often felt in the front of the lower abdomen and in the groin. Contractions may go away when you walk around or change positions while lying down. Contractions get weaker and are shorter-lasting as time goes on. The cervix usually does not dilate or become thin. Follow these instructions at home:  Take over-the-counter and prescription medicines only as told by your health care provider. Keep up with your usual exercises and follow other instructions from your health care provider. Eat and drink lightly if you think  you are going into labor. If Braxton Hicks contractions are making you uncomfortable: Change your position from lying down or resting to walking, or change from walking to resting. Sit and rest in a tub of warm water. Drink enough fluid to keep your urine pale yellow. Dehydration may cause these contractions. Do slow and deep breathing several times an hour. Keep all follow-up prenatal visits as told by your health care provider. This is important. Contact a health care provider if: You have a fever. You have continuous pain in your abdomen. Get help right away if: Your contractions become stronger, more regular, and closer together. You have fluid leaking or gushing from your vagina. You pass  blood-tinged mucus (bloody show). You have bleeding from your vagina. You have low back pain that you never had before. You feel your baby's head pushing down and causing pelvic pressure. Your baby is not moving inside you as much as it used to. Summary Contractions that occur before labor are called Braxton Hicks contractions, false labor, or practice contractions. Braxton Hicks contractions are usually shorter, weaker, farther apart, and less regular than true labor contractions. True labor contractions usually become progressively stronger and regular, and they become more frequent. Manage discomfort from [facility] contractions by changing position, resting in a warm bath, drinking plenty of water, or practicing deep breathing. This information is not intended to replace advice given to you by your health care provider. Make sure you discuss any questions you have with your health care provider. Document Revised: 04/21/2017 Document Reviewed: 09/22/2016 Elsevier Patient Education  2020 [employer].

## 2023-10-24 NOTE — Progress Notes (Signed)
 HIGH-RISK PREGNANCY VISIT Patient name: Alison Sandoval MRN [id number]  Date of birth: [birth year] Chief Complaint:   Routine Prenatal Visit and Non-stress Test  History of Present Illness:   Alison Sandoval is a 28 y.o. G91P1001 female at [redacted]w[redacted]d with an Estimated Date of Delivery: 11/15/23 being seen today for ongoing management of a high-risk pregnancy complicated by diabetes mellitus A2DM currently on metformin  500mg  BID and PG BMI 36.    Today she reports fastings 79-106 (only 2 ^- but ate snack in middle of night so not true fasting), all 2hr pp wnl. Contractions: Not present. Vag. Bleeding: None.  Movement: Present. Reports some wetness last few days, increased discharge w/ irritation.     08/15/2023   10:08 AM 05/02/2023   10:32 AM 01/03/2023    1:27 PM 04/16/2018    4:14 PM 04/16/2018    4:13 PM  Depression screen PHQ 2/9  Decreased Interest 0 0 1 1 1   Down, Depressed, Hopeless 0 0 0 1 1  PHQ - 2 Score 0 0 1 2 2   Altered sleeping 1 3 3 1    Tired, decreased energy 1 2 3 1    Change in appetite 0 2 3 1    Feeling bad or failure about yourself  0 0 1 0   Trouble concentrating 0 1 1 1    Moving slowly or fidgety/restless 0 0 0 1   Suicidal thoughts 0 0 0 0   PHQ-9 Score 2 8 12 7    Difficult doing work/chores    Somewhat difficult         05/02/2023   10:32 AM 01/03/2023    1:27 PM  GAD 7 : Generalized Anxiety Score  Nervous, Anxious, on Edge 0 3  Control/stop worrying 0 3  Worry too much - different things 1 3  Trouble relaxing 1 2  Restless 0 1  Easily annoyed or irritable 0 1  Afraid - awful might happen 1 2  Total GAD 7 Score 3 15     Review of Systems:   Pertinent items are noted in HPI Denies abnormal vaginal discharge w/ itching/odor/irritation, headaches, visual changes, shortness of breath, chest pain, abdominal pain, severe nausea/vomiting, or problems with urination or bowel movements unless otherwise stated above. Pertinent History Reviewed:  Reviewed past  medical,surgical, social, obstetrical and family history.  Reviewed problem list, medications and allergies. Physical Assessment:   Vitals:   10/24/23 1528  BP: 121/78  Pulse: (!) 111  Weight: 235 lb (106.6 kg)  Body mass index is 40.34 kg/m.           Physical Examination:   General appearance: alert, well appearing, and in no distress  Mental status: alert, oriented to person, place, and time  Skin: warm & dry   Extremities:      Cardiovascular: normal heart rate noted  Respiratory: normal respiratory effort, no distress  Abdomen: gravid, soft, non-tender  Pelvic: Cervical exam performed  Dilation: 1 Effacement (%): Thick Station: Ballotable  Fetal Status:     Movement: Present Presentation: Vertex  Fetal Surveillance Testing today: NST: FHR baseline 130 bpm, Variability: moderate, Accelerations:present, Decelerations:  Absent= Cat 1/reactive Toco: none    Chaperone: Wendell Halt  Results for orders placed or performed in visit on 10/24/23 (from the past 24 hours)  POC Urinalysis Dipstick OB   Collection Time: 10/24/23  4:12 PM  Result Value Ref Range   Color, UA     Clarity, UA     Glucose, UA Negative Negative  Bilirubin, UA     Ketones, UA neg    Spec Grav, UA     Blood, UA neg    pH, UA     POC,PROTEIN,UA Negative Negative, Trace, Small (1+), Moderate (2+), Large (3+), 4+   Urobilinogen, UA     Nitrite, UA neg    Leukocytes, UA Small (1+) (A) Negative   Appearance     Odor      Assessment & Plan:  High-risk pregnancy: G2P1001 at [redacted]w[redacted]d with an Estimated Date of Delivery: 11/15/23   1) A2DM, stable on metformin  500mg  BID, last EFW 70% @ 34.6w w/ normal AFI  2) PGBMI 36, currently 40  3) Prev c/s> wants TOLAC (consent signed 5/16)  4) Vaginal d/c w/ irritation> CV swab  Meds: No orders of the defined types were placed in this encounter.  Labs/procedures today: GBS, GC/CT, SVE, and NST  Treatment Plan:  EFW q 4w    2x/wk NST or weekly bpp      Deliver @ 39-39.6wks (pt wants 39w), scheduled for 6/18 @[place] ,  IOL form faxed and orders placed   Reviewed: Preterm labor symptoms and general obstetric precautions including but not limited to vaginal bleeding, contractions, leaking of fluid and fetal movement were reviewed in detail with the patient.  All questions were answered. Does have home bp cuff. Office bp cuff given: not applicable. Check bp weekly, let us  know if consistently >140 and/or >90.  Follow-up: Return for As scheduled.   Future Appointments  Date Time Provider Department Center  10/27/2023 10:10 AM [facility] NURSE [facility]  10/31/2023  3:30 PM [facility] NURSE [facility]  10/31/2023  3:50 PM Ferd Householder, CNM [facility]  11/03/2023 10:30 AM [facility] NURSE [facility]  11/07/2023  3:00 PM [facility] IMG 2 [facility] None  11/07/2023  3:50 PM Keene Pastures, DO [facility]  11/08/2023 12:00 AM [facility] SCHED ROOM [facility] None  11/14/2023  3:30 PM [facility] NURSE [facility]  11/14/2023  3:50 PM Ferd Householder, CNM [facility]    Orders Placed This Encounter  Procedures   Culture, beta strep (group b only)   POC Urinalysis Dipstick OB   Ferd Householder CNM, [facility] 10/24/2023 4:14 PM

## 2023-10-26 ENCOUNTER — Ambulatory Visit: Payer: Self-pay | Admitting: Women's Health

## 2023-10-26 LAB — CERVICOVAGINAL ANCILLARY ONLY
Bacterial Vaginitis (gardnerella): NEGATIVE
Candida Glabrata: NEGATIVE
Candida Vaginitis: POSITIVE — AB
Chlamydia: NEGATIVE
Comment: NEGATIVE
Comment: NEGATIVE
Comment: NEGATIVE
Comment: NEGATIVE
Comment: NEGATIVE
Comment: NORMAL
Neisseria Gonorrhea: NEGATIVE
Trichomonas: NEGATIVE

## 2023-10-26 MED ORDER — TERCONAZOLE 0.4 % VA CREA: 1.0000 | TOPICAL_CREAM | Freq: Every day | VAGINAL | 0 refills | Status: DC

## 2023-10-27 ENCOUNTER — Ambulatory Visit

## 2023-10-27 VITALS — BP 120/76 | Wt 243.0 lb

## 2023-10-27 DIAGNOSIS — Z3A37 37 weeks gestation of pregnancy: Secondary | ICD-10-CM | POA: Diagnosis not present

## 2023-10-27 DIAGNOSIS — O24415 Gestational diabetes mellitus in pregnancy, controlled by oral hypoglycemic drugs: Secondary | ICD-10-CM

## 2023-10-27 DIAGNOSIS — O0993 Supervision of high risk pregnancy, unspecified, third trimester: Secondary | ICD-10-CM

## 2023-10-27 LAB — CULTURE, BETA STREP (GROUP B ONLY): Strep Gp B Culture: POSITIVE — AB

## 2023-10-27 NOTE — Progress Notes (Signed)
   NURSE VISIT- NST  SUBJECTIVE:  Alison Sandoval is a 28 y.o. G79P1001 female at [redacted]w[redacted]d, here for a NST for pregnancy complicated by Diabetes: A2DM.  She reports active fetal movement, contractions: none, vaginal bleeding: none, membranes: intact.   OBJECTIVE:  BP 120/76   Wt 243 lb (110.2 kg)   LMP 02/08/2023 (Exact Date) Comment: Shorter than normal  BMI 41.71 kg/m   Appears well, no apparent distress  No results found for this or any previous visit (from the past 24 hours).  NST: FHR baseline 135 bpm, Variability: moderate, Accelerations:present, Decelerations:  Absent= Cat 1/reactive Toco: none   ASSESSMENT: G2P1001 at [redacted]w[redacted]d with Diabetes: A2DM NST reactive  PLAN: EFM strip reviewed by Dr. Ozan   Recommendations: keep next appointment as scheduled    Alyssa Jumper  10/27/2023 10:43 AM

## 2023-10-30 ENCOUNTER — Other Ambulatory Visit: Payer: Self-pay | Admitting: Women's Health

## 2023-10-31 ENCOUNTER — Encounter: Payer: Self-pay | Admitting: Women's Health

## 2023-10-31 ENCOUNTER — Ambulatory Visit: Admitting: Women's Health

## 2023-10-31 ENCOUNTER — Other Ambulatory Visit

## 2023-10-31 VITALS — BP 128/77 | HR 101 | Wt 244.2 lb

## 2023-10-31 DIAGNOSIS — O0993 Supervision of high risk pregnancy, unspecified, third trimester: Secondary | ICD-10-CM | POA: Diagnosis not present

## 2023-10-31 DIAGNOSIS — O0990 Supervision of high risk pregnancy, unspecified, unspecified trimester: Secondary | ICD-10-CM

## 2023-10-31 DIAGNOSIS — O24419 Gestational diabetes mellitus in pregnancy, unspecified control: Secondary | ICD-10-CM | POA: Diagnosis not present

## 2023-10-31 DIAGNOSIS — Z3A37 37 weeks gestation of pregnancy: Secondary | ICD-10-CM | POA: Diagnosis not present

## 2023-10-31 NOTE — Progress Notes (Signed)
 HIGH-RISK PREGNANCY VISIT Patient name: Alison Sandoval MRN [id number]  Date of birth: 31-Dec-1995 Chief Complaint:   Routine Prenatal Visit and Non-stress Test  History of Present Illness:   Alison Sandoval is a 28 y.o. G64P1001 female at [redacted]w[redacted]d with an Estimated Date of Delivery: 11/15/23 being seen today for ongoing management of a high-risk pregnancy complicated by diabetes mellitus A2DM currently on metformin  500mg  BID and PG BMI 36.    Today she reports all sugars wnl, occ tightening pain lower abd, feet swelling/hurting. Contractions: Irritability.  .  Movement: Present. denies leaking of fluid.      08/15/2023   10:08 AM 05/02/2023   10:32 AM 01/03/2023    1:27 PM 04/16/2018    4:14 PM 04/16/2018    4:13 PM  Depression screen PHQ 2/9  Decreased Interest 0 0 1 1 1   Down, Depressed, Hopeless 0 0 0 1 1  PHQ - 2 Score 0 0 1 2 2   Altered sleeping 1 3 3 1    Tired, decreased energy 1 2 3 1    Change in appetite 0 2 3 1    Feeling bad or failure about yourself  0 0 1 0   Trouble concentrating 0 1 1 1    Moving slowly or fidgety/restless 0 0 0 1   Suicidal thoughts 0 0 0 0   PHQ-9 Score 2 8 12 7    Difficult doing work/chores    Somewhat difficult         05/02/2023   10:32 AM 01/03/2023    1:27 PM  GAD 7 : Generalized Anxiety Score  Nervous, Anxious, on Edge 0 3  Control/stop worrying 0 3  Worry too much - different things 1 3  Trouble relaxing 1 2  Restless 0 1  Easily annoyed or irritable 0 1  Afraid - awful might happen 1 2  Total GAD 7 Score 3 15     Review of Systems:   Pertinent items are noted in HPI Denies abnormal vaginal discharge w/ itching/odor/irritation, headaches, visual changes, shortness of breath, chest pain, abdominal pain, severe nausea/vomiting, or problems with urination or bowel movements unless otherwise stated above. Pertinent History Reviewed:  Reviewed past medical,surgical, social, obstetrical and family history.  Reviewed problem list,  medications and allergies. Physical Assessment:   Vitals:   10/31/23 1545  BP: 128/77  Pulse: (!) 101  Weight: 244 lb 3.2 oz (110.8 kg)  Body mass index is 41.92 kg/m.           Physical Examination:   General appearance: alert, well appearing, and in no distress  Mental status: alert, oriented to person, place, and time  Skin: warm & dry   Extremities: Edema: Trace    Cardiovascular: normal heart rate noted  Respiratory: normal respiratory effort, no distress  Abdomen: gravid, soft, non-tender  Pelvic: Cervical exam deferred         Fetal Status:     Movement: Present    Fetal Surveillance Testing today: NST: FHR baseline 135 bpm, Variability: moderate, Accelerations:present, Decelerations:  Absent= Cat 1/reactive Toco: none    Chaperone: N/A  No results found for this or any previous visit (from the past 24 hours).  Assessment & Plan:  High-risk pregnancy: G2P1001 at [redacted]w[redacted]d with an Estimated Date of Delivery: 11/15/23   1) A2DM, stable on metformin  500mg  BID, EFW 70% w/  normal AFI at 34w, rpt next week  2) PGBMI36, currently 41  3) Prev c/s> wants TOLAC, IOL scheduled for 6/18  Meds:  No orders of the defined types were placed in this encounter.   Labs/procedures today: NST  Treatment Plan:   EFW q 4w    2x/wk NST or weekly bpp     Deliver @ 39-39.6wks:______   Reviewed: Preterm labor symptoms and general obstetric precautions including but not limited to vaginal bleeding, contractions, leaking of fluid and fetal movement were reviewed in detail with the patient.  All questions were answered. Does have home bp cuff. Office bp cuff given: not applicable. Check bp daily, let us  know if consistently >140 and/or >90.  Follow-up: Return for As scheduled.   Future Appointments  Date Time Provider Department Center  11/03/2023 10:30 AM [facility] NURSE [facility]  11/07/2023  3:00 PM [facility] IMG 2 [facility] None  11/07/2023  3:50 PM Keene Pastures, DO [facility]   11/08/2023 12:00 AM [facility] SCHED ROOM [facility] None  11/14/2023  3:30 PM [facility] NURSE [facility]  11/14/2023  3:50 PM Ferd Householder, CNM [facility]    No orders of the defined types were placed in this encounter.  Ferd Householder CNM, [facility] 10/31/2023 4:22 PM

## 2023-10-31 NOTE — Patient Instructions (Signed)
 [facility], thank you for choosing our office today! We appreciate the opportunity to meet your healthcare needs. You may receive a short survey by mail, e-mail, or through [employer]. If you are happy with your care we would appreciate if you could take just a few minutes to complete the survey questions. We read all of your comments and take your feedback very seriously. Thank you again for choosing our office.  Center for [employer] Team at [facility]  [facility] at [facility] ([address] [place], [place] [address]) Entrance C, located off of [address] Free 24/7 valet parking   CLASSES: Go to [email or web address] to register for classes (childbirth, breastfeeding, waterbirth, infant CPR, daddy bootcamp, etc.)  Call the office [phone]) or go to [facility] if: You begin to have strong, frequent contractions Your water breaks.  Sometimes it is a big gush of fluid, sometimes it is just a trickle that keeps getting your panties wet or running down your legs You have vaginal bleeding.  It is normal to have a small amount of spotting if your cervix was checked.  You don't feel your baby moving like normal.  If you don't, get you something to eat and drink and lay down and focus on feeling your baby move.   If your baby is still not moving like normal, you should call the office or go to [facility]  Call the office [phone]) or go to [facility] hospital for these signs of pre-eclampsia: Severe headache that does not go away with Tylenol  Visual changes- seeing spots, double, blurred vision Pain under your right breast or upper abdomen that does not go away with Tums or heartburn medicine Nausea and/or vomiting Severe swelling in your hands, feet, and face   [place] Pediatricians/Family Doctors [facility]): [address] Meg Spina, [phone]           [facility]: [address], [phone]                 [facility]): [address], [phone] (call to ask if accepting patients) [facility] Department: [address], [place], [phone]    [facility] Pediatricians/Family Doctors [facility]): [address], [phone] [facility]: [address] [place], [phone] [facility]: [address], [phone]  [facility] Doctors  [facility]): [phone] [facility]: [address] [phone]   [facility] Doctors [facility]: [address], [phone]  [facility] Doctors  [facility]: [phone], [phone]  Home Blood Pressure Monitoring for Patients   Your provider has recommended that you check your blood pressure (BP) at least once a week at home. If you do not have a blood pressure cuff at home, one will be provided for you. Contact your provider if you have not received your monitor within 1 week.   Helpful Tips for Accurate Home Blood Pressure Checks  Don't smoke, exercise, or drink caffeine 30 minutes before checking your BP Use the restroom before checking your BP (a full bladder can raise your pressure) Relax in a comfortable upright chair Feet on the ground Left arm resting comfortably on a flat surface at the level of your heart Legs uncrossed Back supported Sit quietly and don't talk Place the cuff on your bare arm Adjust snuggly, so that only two fingertips  can fit between your skin and the top of the cuff Check 2 readings separated by at least one minute Keep a log of your BP readings For a visual, please reference this diagram: http://ccnc.care/bpdiagram  Provider Name: [facility]     Phone: [phone]  Zone 1: ALL CLEAR  Continue to monitor your symptoms:  BP reading is less than 140 (top number) or less than 90 (bottom number)  No right  upper stomach pain No headaches or seeing spots No feeling nauseated or throwing up No swelling in face and hands  Zone 2: CAUTION Call your doctor's office for any of the following:  BP reading is greater than 140 (top number) or greater than 90 (bottom number)  Stomach pain under your ribs in the middle or right side Headaches or seeing spots Feeling nauseated or throwing up Swelling in face and hands  Zone 3: EMERGENCY  Seek immediate medical care if you have any of the following:  BP reading is greater than160 (top number) or greater than 110 (bottom number) Severe headaches not improving with Tylenol  Serious difficulty catching your breath Any worsening symptoms from Zone 2   Braxton Hicks Contractions Contractions of the uterus can occur throughout pregnancy, but they are not always a sign that you are in labor. You may have practice contractions called Braxton Hicks contractions. These false labor contractions are sometimes confused with true labor. What are Glover Larve contractions? Braxton Hicks contractions are tightening movements that occur in the muscles of the uterus before labor. Unlike true labor contractions, these contractions do not result in opening (dilation) and thinning of the cervix. Toward the end of pregnancy (32-34 weeks), Braxton Hicks contractions can happen more often and may become stronger. These contractions are sometimes difficult to tell apart from true labor because they can be very uncomfortable. You should not feel embarrassed if you go to the hospital with false labor. Sometimes, the only way to tell if you are in true labor is for your health care provider to look for changes in the cervix. The health care provider will do a physical exam and may monitor your contractions. If you are not in true labor, the exam should show that your cervix is not dilating and your water has not broken. If there are no other health problems associated with your  pregnancy, it is completely safe for you to be sent home with false labor. You may continue to have Braxton Hicks contractions until you go into true labor. How to tell the difference between true labor and false labor True labor Contractions last 30-70 seconds. Contractions become very regular. Discomfort is usually felt in the top of the uterus, and it spreads to the lower abdomen and low back. Contractions do not go away with walking. Contractions usually become more intense and increase in frequency. The cervix dilates and gets thinner. False labor Contractions are usually shorter and not as strong as true labor contractions. Contractions are usually irregular. Contractions are often felt in the front of the lower abdomen and in the groin. Contractions may go away when you walk around or change positions while lying down. Contractions get weaker and are shorter-lasting as time goes on. The cervix usually does not dilate or become thin. Follow these instructions at home:  Take over-the-counter and prescription medicines only as told by your health care provider. Keep up with your usual exercises and follow other instructions from your health care provider. Eat and drink lightly if you think  you are going into labor. If Braxton Hicks contractions are making you uncomfortable: Change your position from lying down or resting to walking, or change from walking to resting. Sit and rest in a tub of warm water. Drink enough fluid to keep your urine pale yellow. Dehydration may cause these contractions. Do slow and deep breathing several times an hour. Keep all follow-up prenatal visits as told by your health care provider. This is important. Contact a health care provider if: You have a fever. You have continuous pain in your abdomen. Get help right away if: Your contractions become stronger, more regular, and closer together. You have fluid leaking or gushing from your vagina. You pass  blood-tinged mucus (bloody show). You have bleeding from your vagina. You have low back pain that you never had before. You feel your baby's head pushing down and causing pelvic pressure. Your baby is not moving inside you as much as it used to. Summary Contractions that occur before labor are called Braxton Hicks contractions, false labor, or practice contractions. Braxton Hicks contractions are usually shorter, weaker, farther apart, and less regular than true labor contractions. True labor contractions usually become progressively stronger and regular, and they become more frequent. Manage discomfort from [facility] contractions by changing position, resting in a warm bath, drinking plenty of water, or practicing deep breathing. This information is not intended to replace advice given to you by your health care provider. Make sure you discuss any questions you have with your health care provider. Document Revised: 04/21/2017 Document Reviewed: 09/22/2016 Elsevier Patient Education  2020 [employer].

## 2023-11-03 ENCOUNTER — Telehealth (HOSPITAL_COMMUNITY): Payer: Self-pay | Admitting: *Deleted

## 2023-11-03 ENCOUNTER — Ambulatory Visit: Admitting: *Deleted

## 2023-11-03 ENCOUNTER — Encounter (HOSPITAL_COMMUNITY): Payer: Self-pay | Admitting: *Deleted

## 2023-11-03 VITALS — BP 120/73 | HR 111

## 2023-11-03 DIAGNOSIS — Z3A38 38 weeks gestation of pregnancy: Secondary | ICD-10-CM | POA: Diagnosis not present

## 2023-11-03 DIAGNOSIS — O0990 Supervision of high risk pregnancy, unspecified, unspecified trimester: Secondary | ICD-10-CM

## 2023-11-03 DIAGNOSIS — O0993 Supervision of high risk pregnancy, unspecified, third trimester: Secondary | ICD-10-CM

## 2023-11-03 NOTE — Telephone Encounter (Signed)
 Preadmission screen

## 2023-11-03 NOTE — Progress Notes (Signed)
   NURSE VISIT- NST  SUBJECTIVE:  Alison Sandoval is a 28 y.o. G23P1001 female at [redacted]w[redacted]d, here for a NST for pregnancy complicated by Diabetes: A2DM.  She reports active fetal movement, contractions: none, vaginal bleeding: none, membranes: intact.   OBJECTIVE:  BP 120/73   Pulse (!) 111   LMP 02/08/2023 (Exact Date) Comment: Shorter than normal  Appears well, no apparent distress  No results found for this or any previous visit (from the past 24 hours).  NST: FHR baseline 130 bpm, Variability: moderate, Accelerations:present, Decelerations:  Absent= Cat 1/reactive Toco: none  Tracing 10 beats lower than monitor.   ASSESSMENT: G2P1001 at [redacted]w[redacted]d with Diabetes: A2DM NST reactive  PLAN: EFM strip reviewed by Dr. Ozan   Recommendations: keep next appointment as scheduled    Kerrie Peek  11/03/2023 11:46 AM

## 2023-11-07 ENCOUNTER — Ambulatory Visit: Admitting: Radiology

## 2023-11-07 ENCOUNTER — Encounter: Payer: Self-pay | Admitting: Obstetrics & Gynecology

## 2023-11-07 ENCOUNTER — Ambulatory Visit: Admitting: Obstetrics & Gynecology

## 2023-11-07 VITALS — BP 103/59 | HR 113 | Wt 242.6 lb

## 2023-11-07 DIAGNOSIS — O0993 Supervision of high risk pregnancy, unspecified, third trimester: Secondary | ICD-10-CM | POA: Diagnosis not present

## 2023-11-07 DIAGNOSIS — Z6839 Body mass index (BMI) 39.0-39.9, adult: Secondary | ICD-10-CM

## 2023-11-07 DIAGNOSIS — O0990 Supervision of high risk pregnancy, unspecified, unspecified trimester: Secondary | ICD-10-CM

## 2023-11-07 DIAGNOSIS — O24419 Gestational diabetes mellitus in pregnancy, unspecified control: Secondary | ICD-10-CM

## 2023-11-07 DIAGNOSIS — Z3A38 38 weeks gestation of pregnancy: Secondary | ICD-10-CM | POA: Diagnosis not present

## 2023-11-07 DIAGNOSIS — O24415 Gestational diabetes mellitus in pregnancy, controlled by oral hypoglycemic drugs: Secondary | ICD-10-CM

## 2023-11-07 LAB — POCT URINALYSIS DIPSTICK OB
Blood, UA: NEGATIVE
Glucose, UA: NEGATIVE
Nitrite, UA: NEGATIVE

## 2023-11-07 NOTE — Progress Notes (Signed)
 HIGH-RISK PREGNANCY VISIT Patient name: Alison Sandoval MRN [id number]  Date of birth: 1995-07-17 Chief Complaint:   Routine Prenatal Visit  History of Present Illness:   Alison Sandoval is a 28 y.o. G63P1001 female at [redacted]w[redacted]d with an Estimated Date of Delivery: 11/15/23 being seen today for ongoing management of a high-risk pregnancy complicated by:  -Prior C-section  -GDMA2 Forgot phone, but per pt sugars have been normal  Today she reports no complaints.   Contractions: Not present. Vag. Bleeding: None.  Movement: Present. denies leaking of fluid.      08/15/2023   10:08 AM 05/02/2023   10:32 AM 01/03/2023    1:27 PM 04/16/2018    4:14 PM 04/16/2018    4:13 PM  Depression screen PHQ 2/9  Decreased Interest 0 0 1 1 1   Down, Depressed, Hopeless 0 0 0 1 1  PHQ - 2 Score 0 0 1 2 2   Altered sleeping 1 3 3 1    Tired, decreased energy 1 2 3 1    Change in appetite 0 2 3 1    Feeling bad or failure about yourself  0 0 1 0   Trouble concentrating 0 1 1 1    Moving slowly or fidgety/restless 0 0 0 1   Suicidal thoughts 0 0 0 0   PHQ-9 Score 2 8 12 7    Difficult doing work/chores    Somewhat difficult      Current Outpatient Medications  Medication Instructions   Accu-Chek Softclix Lancets lancets Use as instructed to check blood sugar 4 times daily   aspirin  EC 81 mg, Oral, Daily, Swallow whole.   Blood Glucose Monitoring Suppl (ACCU-CHEK GUIDE ME) w/Device KIT 1 each, Does not apply, 4 times daily   Blood Pressure Monitor MISC For regular home bp monitoring during pregnancy   ferrous sulfate  325 mg, Oral, Every other day   glucose blood test strip Use as instructed to check blood sugar four times daily   metFORMIN  (GLUCOPHAGE ) 500 mg, Oral, 2 times daily with meals   OVER THE COUNTER MEDICATION Nature made gummy prenatal vitamins   Prenatal Vit-Fe Fumarate-FA (PRENATAL MULTIVITAMIN) TABS tablet 1 tablet, Daily   terconazole  (TERAZOL 7 ) 0.4 % vaginal cream 1 applicator, Vaginal,  Daily at bedtime     Review of Systems:   Pertinent items are noted in HPI Denies abnormal vaginal discharge w/ itching/odor/irritation, headaches, visual changes, shortness of breath, chest pain, abdominal pain, severe nausea/vomiting, or problems with urination or bowel movements unless otherwise stated above. Pertinent History Reviewed:  Reviewed past medical,surgical, social, obstetrical and family history.  Reviewed problem list, medications and allergies. Physical Assessment:   Vitals:   11/07/23 1547  BP: (!) 103/59  Pulse: (!) 113  Weight: 242 lb 9.6 oz (110 kg)  Body mass index is 41.64 kg/m.           Physical Examination:   General appearance: alert, well appearing, and in no distress  Mental status: normal mood, behavior, speech, dress, motor activity, and thought processes  Skin: warm & dry   Extremities:      Cardiovascular: normal heart rate noted  Respiratory: normal respiratory effort, no distress  Abdomen: gravid, soft, non-tender  Pelvic: Cervical exam performed  Dilation: Fingertip Effacement (%): 20 Station: -3  Fetal Status:     Movement: Present    Fetal Surveillance Testing today: cephalic, FHR = 150 bpm, AFI = 13.6 cm, 55%, MVP = 6 cm, BPP = 8/8    Chaperone: pt declined- family  members present        Assessment & Plan:  High-risk pregnancy: G2P1001 at [redacted]w[redacted]d with an Estimated Date of Delivery: 11/15/23   1) GDMA2 -IOL scheduled for tonight -reviewed questions/concerns regarding IOL  2) Prior C-section, desires TOLAC  Meds: No orders of the defined types were placed in this encounter.   Labs/procedures today: BPP  Treatment Plan:  IOL tonight  Reviewed: Term labor symptoms and general obstetric precautions including but not limited to vaginal bleeding, contractions, leaking of fluid and fetal movement were reviewed in detail with the patient.  All questions were answered. Pt has home bp cuff. Check bp weekly, let us  know if >140/90.    Follow-up: Return for IOL tonight.   Future Appointments  Date Time Provider Department Center  11/08/2023 12:00 AM [facility] SCHED ROOM [facility] None    Orders Placed This Encounter  Procedures   POC Urinalysis Dipstick OB    Alison Kirchner, DO Attending Obstetrician & Gynecologist, [facility], [facility]   *

## 2023-11-07 NOTE — Progress Notes (Signed)
 US : GA = 38+6 weeks Single active female fetus, cephalic, FHR = 150 bpm, AFI = 13.6 cm, 55%, MVP = 6 cm, BPP = 8/8

## 2023-11-08 ENCOUNTER — Encounter (HOSPITAL_COMMUNITY): Payer: Self-pay | Admitting: Obstetrics & Gynecology

## 2023-11-08 ENCOUNTER — Inpatient Hospital Stay (HOSPITAL_COMMUNITY)
Admission: RE | Admit: 2023-11-08 | Discharge: 2023-11-10 | DRG: 768 | Disposition: A | Discharge status: Home / Self Care | Attending: Obstetrics & Gynecology | Admitting: Obstetrics & Gynecology

## 2023-11-08 ENCOUNTER — Inpatient Hospital Stay (HOSPITAL_COMMUNITY)

## 2023-11-08 ENCOUNTER — Inpatient Hospital Stay (HOSPITAL_COMMUNITY): Admitting: Anesthesiology

## 2023-11-08 ENCOUNTER — Other Ambulatory Visit: Payer: Self-pay

## 2023-11-08 DIAGNOSIS — O99344 Other mental disorders complicating childbirth: Secondary | ICD-10-CM | POA: Diagnosis not present

## 2023-11-08 DIAGNOSIS — Z8632 Personal history of gestational diabetes: Secondary | ICD-10-CM

## 2023-11-08 DIAGNOSIS — O24424 Gestational diabetes mellitus in childbirth, insulin controlled: Secondary | ICD-10-CM | POA: Diagnosis not present

## 2023-11-08 DIAGNOSIS — O99214 Obesity complicating childbirth: Secondary | ICD-10-CM | POA: Diagnosis present

## 2023-11-08 DIAGNOSIS — O4202 Full-term premature rupture of membranes, onset of labor within 24 hours of rupture: Secondary | ICD-10-CM | POA: Diagnosis not present

## 2023-11-08 DIAGNOSIS — Z98891 History of uterine scar from previous surgery: Secondary | ICD-10-CM

## 2023-11-08 DIAGNOSIS — O9962 Diseases of the digestive system complicating childbirth: Secondary | ICD-10-CM | POA: Diagnosis present

## 2023-11-08 DIAGNOSIS — O0993 Supervision of high risk pregnancy, unspecified, third trimester: Secondary | ICD-10-CM

## 2023-11-08 DIAGNOSIS — O24419 Gestational diabetes mellitus in pregnancy, unspecified control: Principal | ICD-10-CM

## 2023-11-08 DIAGNOSIS — O9081 Anemia of the puerperium: Secondary | ICD-10-CM | POA: Diagnosis not present

## 2023-11-08 DIAGNOSIS — K219 Gastro-esophageal reflux disease without esophagitis: Secondary | ICD-10-CM | POA: Diagnosis present

## 2023-11-08 DIAGNOSIS — Z3A39 39 weeks gestation of pregnancy: Secondary | ICD-10-CM

## 2023-11-08 DIAGNOSIS — E66813 Obesity, class 3: Secondary | ICD-10-CM | POA: Diagnosis present

## 2023-11-08 DIAGNOSIS — F53 Postpartum depression: Secondary | ICD-10-CM | POA: Diagnosis present

## 2023-11-08 DIAGNOSIS — D62 Acute posthemorrhagic anemia: Secondary | ICD-10-CM | POA: Diagnosis not present

## 2023-11-08 DIAGNOSIS — Z7982 Long term (current) use of aspirin: Secondary | ICD-10-CM

## 2023-11-08 DIAGNOSIS — O34211 Maternal care for low transverse scar from previous cesarean delivery: Secondary | ICD-10-CM | POA: Diagnosis present

## 2023-11-08 DIAGNOSIS — O0990 Supervision of high risk pregnancy, unspecified, unspecified trimester: Secondary | ICD-10-CM

## 2023-11-08 DIAGNOSIS — Z8249 Family history of ischemic heart disease and other diseases of the circulatory system: Secondary | ICD-10-CM | POA: Diagnosis not present

## 2023-11-08 DIAGNOSIS — O34219 Maternal care for unspecified type scar from previous cesarean delivery: Principal | ICD-10-CM | POA: Diagnosis not present

## 2023-11-08 DIAGNOSIS — O99820 Streptococcus B carrier state complicating pregnancy: Secondary | ICD-10-CM | POA: Diagnosis not present

## 2023-11-08 DIAGNOSIS — O24415 Gestational diabetes mellitus in pregnancy, controlled by oral hypoglycemic drugs: Secondary | ICD-10-CM

## 2023-11-08 DIAGNOSIS — O24425 Gestational diabetes mellitus in childbirth, controlled by oral hypoglycemic drugs: Principal | ICD-10-CM | POA: Diagnosis present

## 2023-11-08 DIAGNOSIS — O99824 Streptococcus B carrier state complicating childbirth: Secondary | ICD-10-CM | POA: Diagnosis not present

## 2023-11-08 LAB — GLUCOSE, CAPILLARY
Glucose-Capillary: 110 mg/dL — ABNORMAL HIGH (ref 70–99)
Glucose-Capillary: 94 mg/dL (ref 70–99)
Glucose-Capillary: 96 mg/dL (ref 70–99)
Glucose-Capillary: 73 mg/dL (ref 70–99)

## 2023-11-08 LAB — CBC
HCT: 31.7 % — ABNORMAL LOW (ref 36.0–46.0)
Hemoglobin: 9.5 g/dL — ABNORMAL LOW (ref 12.0–15.0)
MCH: 22.2 pg — ABNORMAL LOW (ref 26.0–34.0)
MCHC: 30 g/dL (ref 30.0–36.0)
MCV: 74.2 fL — ABNORMAL LOW (ref 80.0–100.0)
Platelets: 237 10*3/uL (ref 150–400)
RBC: 4.27 MIL/uL (ref 3.87–5.11)
RDW: 18.7 % — ABNORMAL HIGH (ref 11.5–15.5)
WBC: 12.9 10*3/uL — ABNORMAL HIGH (ref 4.0–10.5)
nRBC: 0.2 % (ref 0.0–0.2)

## 2023-11-08 LAB — RPR: RPR Ser Ql: NONREACTIVE

## 2023-11-08 MED ORDER — OXYTOCIN-SODIUM CHLORIDE 30-0.9 UT/500ML-% IV SOLN: 1.0000 m[IU]/min | INTRAVENOUS | Status: DC

## 2023-11-08 MED ORDER — PHENYLEPHRINE 80 MCG/ML (10ML) SYRINGE FOR IV PUSH (FOR BLOOD PRESSURE SUPPORT): 80.0000 ug | PREFILLED_SYRINGE | INTRAVENOUS | Status: DC | PRN

## 2023-11-08 MED ORDER — FENTANYL CITRATE (PF) 100 MCG/2ML IJ SOLN: 100.0000 ug | INTRAMUSCULAR | Status: DC | PRN

## 2023-11-08 MED ORDER — OXYCODONE-ACETAMINOPHEN 5-325 MG PO TABS: 1.0000 | ORAL_TABLET | ORAL | Status: DC | PRN

## 2023-11-08 MED ORDER — OXYTOCIN-SODIUM CHLORIDE 30-0.9 UT/500ML-% IV SOLN
1.0000 m[IU]/min | INTRAVENOUS | Status: DC
  Administered 2023-11-08: 2 m[IU]/min via INTRAVENOUS
  Filled 2023-11-08: qty 500

## 2023-11-08 MED ORDER — HYDROXYZINE HCL 50 MG PO TABS: 50.0000 mg | ORAL_TABLET | Freq: Four times a day (QID) | ORAL | Status: DC | PRN

## 2023-11-08 MED ORDER — ACETAMINOPHEN 325 MG PO TABS: 650.0000 mg | ORAL_TABLET | ORAL | Status: DC | PRN

## 2023-11-08 MED ORDER — OXYTOCIN-SODIUM CHLORIDE 30-0.9 UT/500ML-% IV SOLN: 2.5000 [IU]/h | INTRAVENOUS | Status: DC

## 2023-11-08 MED ORDER — DIPHENHYDRAMINE HCL 50 MG/ML IJ SOLN
12.5000 mg | INTRAMUSCULAR | Status: DC | PRN
  Administered 2023-11-08: 12.5 mg via INTRAVENOUS
  Filled 2023-11-08: qty 1

## 2023-11-08 MED ORDER — EPHEDRINE 5 MG/ML INJ: 10.0000 mg | INTRAVENOUS | Status: DC | PRN

## 2023-11-08 MED ORDER — FLEET ENEMA RE ENEM: 1.0000 | ENEMA | RECTAL | Status: DC | PRN

## 2023-11-08 MED ORDER — LACTATED RINGERS IV SOLN: INTRAVENOUS | Status: DC

## 2023-11-08 MED ORDER — SOD CITRATE-CITRIC ACID 500-334 MG/5ML PO SOLN
30.0000 mL | ORAL | Status: DC | PRN
  Administered 2023-11-09: 30 mL via ORAL
  Filled 2023-11-08: qty 30

## 2023-11-08 MED ORDER — FENTANYL-BUPIVACAINE-NACL 0.5-0.125-0.9 MG/250ML-% EP SOLN
12.0000 mL/h | EPIDURAL | Status: DC | PRN
  Administered 2023-11-08: 12 mL/h via EPIDURAL
  Filled 2023-11-08: qty 250

## 2023-11-08 MED ORDER — ONDANSETRON HCL 4 MG/2ML IJ SOLN: 4.0000 mg | Freq: Four times a day (QID) | INTRAMUSCULAR | Status: DC | PRN

## 2023-11-08 MED ORDER — LACTATED RINGERS IV SOLN
500.0000 mL | Freq: Once | INTRAVENOUS | Status: AC
  Administered 2023-11-08: 500 mL via INTRAVENOUS

## 2023-11-08 MED ORDER — SODIUM CHLORIDE 0.9 % IV SOLN
5.0000 10*6.[IU] | Freq: Once | INTRAVENOUS | Status: AC
  Administered 2023-11-08: 5 10*6.[IU] via INTRAVENOUS
  Filled 2023-11-08: qty 5

## 2023-11-08 MED ORDER — LACTATED RINGERS IV SOLN: 500.0000 mL | INTRAVENOUS | Status: DC | PRN

## 2023-11-08 MED ORDER — OXYTOCIN BOLUS FROM INFUSION: 333.0000 mL | Freq: Once | INTRAVENOUS | Status: DC

## 2023-11-08 MED ORDER — TERBUTALINE SULFATE 1 MG/ML IJ SOLN: 0.2500 mg | Freq: Once | INTRAMUSCULAR | Status: DC | PRN

## 2023-11-08 MED ORDER — OXYCODONE-ACETAMINOPHEN 5-325 MG PO TABS: 2.0000 | ORAL_TABLET | ORAL | Status: DC | PRN

## 2023-11-08 MED ORDER — PENICILLIN G POT IN DEXTROSE 60000 UNIT/ML IV SOLN
3.0000 10*6.[IU] | INTRAVENOUS | Status: DC
  Administered 2023-11-08 – 2023-11-09 (×4): 3 10*6.[IU] via INTRAVENOUS
  Filled 2023-11-08 (×4): qty 50

## 2023-11-08 MED ORDER — LIDOCAINE HCL (PF) 1 % IJ SOLN: 30.0000 mL | INTRAMUSCULAR | Status: DC | PRN

## 2023-11-08 MED ORDER — TERBUTALINE SULFATE 1 MG/ML IJ SOLN
0.2500 mg | Freq: Once | INTRAMUSCULAR | Status: DC | PRN
Start: 2023-11-08 — End: 2023-11-09

## 2023-11-08 MED ORDER — LIDOCAINE HCL (PF) 1 % IJ SOLN
INTRAMUSCULAR | Status: DC | PRN
  Administered 2023-11-08: 11 mL via EPIDURAL

## 2023-11-08 NOTE — H&P (Signed)
 OBSTETRIC ADMISSION HISTORY AND PHYSICAL  Ammi Hutt is a 28 y.o. female G2P1001 with IUP at [redacted]w[redacted]d by LMP presenting for IOL for GDMA2 on metformin . She reports +FMs, No LOF, no VB, no blurry vision, headaches or peripheral edema, and RUQ pain.  She plans on breast feeding. She requests mirena  for birth control. [facility]   Dating: By LMP --->  Estimated Date of Delivery: 11/15/23  Sono:    @[redacted]w[redacted]d , CWD, normal anatomy, cephalic presentation, posterior placental lie, 2738g, 70% EFW  Prenatal History/Complications: GDMA2, prior c-section for non-reassuring fetal status  Past Medical History: Past Medical History:  Diagnosis Date   Chronic kidney disease    UTI   Diabetes mellitus without complication (HCC)    GERD (gastroesophageal reflux disease)    Gestational diabetes    Headache     Past Surgical History: Past Surgical History:  Procedure Laterality Date   CESAREAN SECTION N/A 06/16/2018   Procedure: CESAREAN SECTION;  Surgeon: Verlyn Goad, MD;  Location: [facility];  Service: Obstetrics;  Laterality: N/A;    Obstetrical History: OB History     Gravida  2   Para  1   Term  1   Preterm      AB      Living  1      SAB      IAB      Ectopic      Multiple  0   Live Births  1           Social History Social History   Socioeconomic History   Marital status: Single    Spouse name: Not on file   Number of children: 1   Years of education: Not on file   Highest education level: Not on file  Occupational History   Not on file  Tobacco Use   Smoking status: Never   Smokeless tobacco: Never  Vaping Use   Vaping status: Former  Substance and Sexual Activity   Alcohol use: Not Currently    Comment: social   Drug use: Not Currently    Types: Marijuana    Comment: before pregnancy   Sexual activity: Not Currently  Other Topics Concern   Not on file  Social History Narrative   Not on file    Social Drivers of Health   Financial Resource Strain: Low Risk  (05/02/2023)   Overall Financial Resource Strain (CARDIA)    Difficulty of Paying Living Expenses: Not hard at all  Food Insecurity: No Food Insecurity (11/08/2023)   Hunger Vital Sign    Worried About Running Out of Food in the Last Year: Never true    Ran Out of Food in the Last Year: Never true  Transportation Needs: No Transportation Needs (11/08/2023)   PRAPARE - Administrator, Civil Service (Medical): No    Lack of Transportation (Non-Medical): No  Physical Activity: Insufficiently Active (05/02/2023)   Exercise Vital Sign    Days of Exercise per Week: 2 days    Minutes of Exercise per Session: 20 min  Stress: Stress Concern Present (05/02/2023)   [employer] of Occupational Health - Occupational Stress Questionnaire    Feeling of Stress : To some extent  Social Connections: Moderately Integrated (05/02/2023)   Social Connection and Isolation Panel    Frequency of Communication with Friends and Family: More than three times a week    Frequency of Social Gatherings with Friends and Family: Once  a week    Attends Religious Services: 1 to 4 times per year    Active Member of Clubs or Organizations: No    Attends Banker Meetings: Never    Marital Status: Living with partner    Family History: Family History  Problem Relation Age of Onset   Hypertension Paternal Grandmother    Hyperlipidemia Maternal Grandmother    Hypertension Father    Hyperlipidemia Father    Hypertension Mother    Asthma Mother     Allergies: Allergies  Allergen Reactions   Caramel Rash    Medications Prior to Admission  Medication Sig Dispense Refill Last Dose/Taking   aspirin  EC 81 MG tablet Take 1 tablet (81 mg total) by mouth daily. Swallow whole. 90 tablet 3 Past Week   metFORMIN  (GLUCOPHAGE ) 500 MG tablet Take 1 tablet (500 mg total) by mouth 2 (two) times daily with a meal. 60 tablet 3  11/07/2023   Prenatal Vit-Fe Fumarate-FA (PRENATAL MULTIVITAMIN) TABS tablet Take 1 tablet by mouth daily at 12 noon.   11/07/2023   Accu-Chek Softclix Lancets lancets Use as instructed to check blood sugar 4 times daily 100 each 12    Blood Glucose Monitoring Suppl (ACCU-CHEK GUIDE ME) w/Device KIT 1 each by Does not apply route 4 (four) times daily. 1 kit 0    Blood Pressure Monitor MISC For regular home bp monitoring during pregnancy (Patient not taking: Reported on 11/07/2023) 1 each 0    ferrous sulfate  325 (65 FE) MG tablet Take 1 tablet (325 mg total) by mouth every other day. (Patient not taking: Reported on 11/07/2023) 15 tablet 0    glucose blood test strip Use as instructed to check blood sugar four times daily 100 each 12    OVER THE COUNTER MEDICATION Nature made gummy prenatal vitamins      terconazole  (TERAZOL 7 ) 0.4 % vaginal cream Place 1 applicator vaginally at bedtime. 45 g 0      Review of Systems   All systems reviewed and negative except as stated in HPI  Blood pressure 129/72, pulse (!) 108, temperature 98.2 F (36.8 C), temperature source Oral, resp. rate 18, height 5' 4 (1.626 m), weight 112.4 kg, last menstrual period 02/08/2023. General appearance: alert, cooperative, and appears stated age Lungs: clear to auscultation bilaterally Heart: regular rate and rhythm Abdomen: soft, non-tender; bowel sounds normal Pelvic: Normal external genitalia Extremities: Homans sign is negative, no sign of DVT Presentation: cephalic Fetal monitoringBaseline: 140 bpm, Variability: Good {> 6 bpm), Accelerations: Reactive, and Decelerations: Absent Uterine activity: sporadic   Dilation: 1.5 Effacement (%): 20 Station: -3 Exam by:: lee  Prenatal labs: ABO, Rh: --/--/PENDING (06/18 1610) Antibody: PENDING (06/18 0656) Rubella: 1.09 (12/10 1143) RPR: Non Reactive (03/25 0831)  HBsAg: Negative (12/10 1143)  HIV: Non Reactive (03/25 0831)  GBS: Positive/-- (06/03 1705)    Lab  Results  Component Value Date   GBS Positive (A) 10/24/2023   GTT abnormal Genetic screening  LR female Anatomy US  normal anatomy with multiple repeats for growth with normal anatomy   Immunization History  Administered Date(s) Administered   Influenza, Seasonal, Injecte, Preservative Fre 05/02/2023   Influenza,inj,Quad PF,6+ Mos 01/23/2018   MMR 06/19/2018   Tdap 03/29/2018, 09/12/2023    Prenatal Transfer Tool  Maternal Diabetes: Yes:  Diabetes Type:  Insulin/Medication controlled Genetic Screening: Normal Maternal Ultrasounds/Referrals: Normal Fetal Ultrasounds or other Referrals:  None Maternal Substance Abuse:  No Significant Maternal Medications:  metformin  Significant Maternal Lab Results:  Group B Strep positive Number of Prenatal Visits:greater than 3 verified prenatal visits Maternal Vaccinations:TDap and Flu Other Comments:  None   Results for orders placed or performed during the hospital encounter of 11/08/23 (from the past 24 hours)  Type and screen   Collection Time: 11/08/23  6:56 AM  Result Value Ref Range   ABO/RH(D) PENDING    Antibody Screen PENDING    Sample Expiration      11/11/2023,2359 Performed at [facility], [address] [place], [place] [address]   CBC   Collection Time: 11/08/23  7:00 AM  Result Value Ref Range   WBC 12.9 (H) 4.0 - 10.5 K/uL   RBC 4.27 3.87 - 5.11 MIL/uL   Hemoglobin 9.5 (L) 12.0 - 15.0 g/dL   HCT 62.1 (L) 30.8 - 65.7 %   MCV 74.2 (L) 80.0 - 100.0 fL   MCH 22.2 (L) 26.0 - 34.0 pg   MCHC 30.0 30.0 - 36.0 g/dL   RDW 84.6 (H) 96.2 - 95.2 %   Platelets 237 150 - 400 K/uL   nRBC 0.2 0.0 - 0.2 %  Glucose, capillary   Collection Time: 11/08/23  7:03 AM  Result Value Ref Range   Glucose-Capillary 110 (H) 70 - 99 mg/dL  Results for orders placed or performed in visit on 11/07/23 (from the past 24 hours)  POC Urinalysis Dipstick OB   Collection Time: 11/07/23  4:02 PM  Result Value Ref Range   Color, UA      Clarity, UA     Glucose, UA Negative Negative   Bilirubin, UA     Ketones, UA small    Spec Grav, UA     Blood, UA neg    pH, UA     POC,PROTEIN,UA Moderate (2+) Negative, Trace, Small (1+), Moderate (2+), Large (3+), 4+   Urobilinogen, UA     Nitrite, UA neg    Leukocytes, UA Small (1+) (A) Negative   Appearance     Odor      Patient Active Problem List   Diagnosis Date Noted   Gestational diabetes mellitus, class A2 11/08/2023   History of gestational diabetes in prior pregnancy, currently pregnant 05/02/2023   Supervision of high risk pregnancy, antepartum 05/02/2023   Previous cesarean section 05/02/2023   Postpartum depression/anxiety 09/05/2018   Gestational diabetes 03/30/2018    Assessment/Plan:  Paulita Licklider is a 28 y.o. G2P1001 at [redacted]w[redacted]d here for IOL for GDMA2 on metformin .  TOLAC. (C/S at 5 cm prior for NRFHT)  #Labor: Discussed methods of induction for TOLAC. Tried balloon placement, ultimately unsuccessful. Will start pit 2x2 #Pain: Per patient request #FWB: Cat 1  #GBS status:  Positive, first dose PCN [id number]  #Feeding: Breastmilk  #Reproductive Life planning: IUD Mirena  #Circ:  not applicable  #A2GDM: Mildly elevated at arrival (had lifesavers this morning). Well-controlled. EFW ~3300 g  Maud Sorenson, MD

## 2023-11-08 NOTE — Anesthesia Procedure Notes (Signed)
 Epidural Patient location during procedure: OB Start time: 11/08/2023 4:38 PM End time: 11/08/2023 4:51 PM  Staffing Anesthesiologist: Earvin Goldberg, MD Performed: anesthesiologist   Preanesthetic Checklist Completed: patient identified, IV checked, site marked, risks and benefits discussed, surgical consent, monitors and equipment checked, pre-op evaluation and timeout performed  Epidural Patient position: sitting Prep: ChloraPrep Patient monitoring: heart rate, cardiac monitor, continuous pulse ox and blood pressure Approach: midline Location: L2-L3 Injection technique: LOR saline  Needle:  Needle type: Tuohy  Needle gauge: 17 G Needle length: 9 cm Needle insertion depth: 7 cm Catheter type: closed end flexible Catheter size: 20 Guage Catheter at skin depth: 12 cm Test dose: negative  Assessment Events: blood not aspirated, injection not painful, no injection resistance, no paresthesia and negative IV test  Additional Notes Reason for block:procedure for pain

## 2023-11-08 NOTE — Progress Notes (Addendum)
 Labor Progress Note Alison Sandoval is a 28 y.o. G2P1001 at [redacted]w[redacted]d presented for IOL for GDMA2 on metformin .   S: Feeling well, no concerns at this time.   O:  BP 120/67   Pulse 89   Temp 98.1 F (36.7 C) (Oral)   Resp 18   Ht 5' 4 (1.626 m)   Wt 112.4 kg   LMP 02/08/2023 (Exact Date) Comment: Shorter than normal  SpO2 100%   BMI 42.52 kg/m   EFM: 135/moderate/accels+/decels-  CVE: Dilation: 4 Effacement (%): 80 Cervical Position: Posterior Station: -2 Presentation: Vertex Exam by:: Boyd Cabal, RN   A&P: 28 y.o. G2P1001 107w0d here for IOL for GDMA2.  #Labor: Progressing well. Increasing pitocin  to 16.  #Pain: epidural  #FWB: cat 1  #GBS positive s/p PCN x 3  #GDMA2: sugars stable   Andrez Keel MD [facility] [place] Resident PGY-1  Center for [facility], [facility] 11/08/23 9:20 PM   Called to bedside to review management.  Reviewed her concerns regarding TOLAC vs repeat C-section.  Reviewed with patient that FHT remain Cat. 1.  Suspect the increased discomfort is due to transition to active labor.  Good uterine resting tone, no vaginal bleeding noted.  Reassured pt that currently labor seems to be progressing appropriately.  Reviewed questions/concerns and discussed that she always has options regarding management and if desires may proceed with repeat C-section.  Reviewed risk/benefit- pt agreeable to continue with TOLAC.  On SVE now- 5/80/-2, IUPC placed.  Plan to titrate Pitocin  as needed.  Kejuan Bekker, DO Attending Obstetrician & Gynecologist, [facility] for [employer], [facility]

## 2023-11-08 NOTE — Progress Notes (Signed)
 Alison Sandoval is a 28 y.o. G2P1001 at 110w0d  admitted for induction of labor due to Gestational diabetes on metformin .  Subjective: Patient doing well. Reports that her water broke about 1430~. Patient noted increased contractions but states that she is not that uncomfortable  Objective: BP (!) 142/85   Pulse 85   Temp 98.2 F (36.8 C) (Oral)   Resp 18   Ht 5' 4 (1.626 m)   Wt 112.4 kg   LMP 02/08/2023 (Exact Date) Comment: Shorter than normal  BMI 42.52 kg/m  No intake/output data recorded. No intake/output data recorded.  FHT:  FHR: 135 bpm, variability: moderate,  accelerations:  Present,  decelerations:  Absent UC:   irregular, every 5-7 minutes SVE:   Dilation: 3 Effacement (%): 60 Station: -2 Exam by:: lee  Labs: Lab Results  Component Value Date   WBC 12.9 (H) 11/08/2023   HGB 9.5 (L) 11/08/2023   HCT 31.7 (L) 11/08/2023   MCV 74.2 (L) 11/08/2023   PLT 237 11/08/2023   CBG (last 3)  Recent Labs    11/08/23 0703 11/08/23 1054 11/08/23 1540  GLUCAP 110* 94 96    Assessment / Plan: Induction of labor due to gestational diabetes,  progressing well on pitocin   Labor: Progressing normally s/p SROM. Confirmed ROM in room as noted by copious amounts of fluid noted on towels and on glove. Continue to titrate pit upwards as needed.  Fetal Wellbeing:  Category I and Category II Pain Control:  Labor support without medications at this time. Patient may have epidural upon request.  I/D:  GBS positive  Anticipated MOD: Hopeful for vaginal birth   Corie Diamond, Ivor Mars 11/08/2023, 4:30 PM

## 2023-11-08 NOTE — Anesthesia Preprocedure Evaluation (Signed)
 Anesthesia Evaluation  Patient identified by MRN, date of birth, ID band Patient awake    Reviewed: Allergy & Precautions, Patient's Chart, lab work & pertinent test results  History of Anesthesia Complications Negative for: history of anesthetic complications  Airway Mallampati: II  TM Distance: >3 FB Neck ROM: Full    Dental  (+) Teeth Intact   Pulmonary neg pulmonary ROS   Pulmonary exam normal breath sounds clear to auscultation       Cardiovascular negative cardio ROS Normal cardiovascular exam Rhythm:Regular Rate:Normal     Neuro/Psych    Depression    negative neurological ROS     GI/Hepatic Neg liver ROS,GERD  Medicated and Controlled,,  Endo/Other  diabetes, Gestational  Class 3 obesity  Renal/GU negative Renal ROS     Musculoskeletal negative musculoskeletal ROS (+)    Abdominal  (+) + obese  Peds  Hematology negative hematology ROS (+)   Anesthesia Other Findings Day of surgery medications reviewed with the patient.  Reproductive/Obstetrics (+) Pregnancy                             Anesthesia Physical Anesthesia Plan  ASA: III  Anesthesia Plan: Epidural   Post-op Pain Management:    Induction:   PONV Risk Score and Plan: Treatment may vary due to age or medical condition  Airway Management Planned: Natural Airway  Additional Equipment:   Intra-op Plan:   Post-operative Plan:   Informed Consent: I have reviewed the patients History and Physical, chart, labs and discussed the procedure including the risks, benefits and alternatives for the proposed anesthesia with the patient or authorized representative who has indicated his/her understanding and acceptance.       Plan Discussed with: CRNA  Anesthesia Plan Comments:         Anesthesia Quick Evaluation

## 2023-11-09 ENCOUNTER — Other Ambulatory Visit: Payer: Self-pay

## 2023-11-09 ENCOUNTER — Encounter (HOSPITAL_COMMUNITY)
Admission: RE | Disposition: A | Discharge status: Home / Self Care | Payer: Self-pay | Source: Home / Self Care | Attending: Obstetrics & Gynecology

## 2023-11-09 ENCOUNTER — Encounter (HOSPITAL_COMMUNITY): Payer: Self-pay | Admitting: Obstetrics & Gynecology

## 2023-11-09 DIAGNOSIS — O34219 Maternal care for unspecified type scar from previous cesarean delivery: Principal | ICD-10-CM | POA: Diagnosis not present

## 2023-11-09 DIAGNOSIS — O34211 Maternal care for low transverse scar from previous cesarean delivery: Secondary | ICD-10-CM | POA: Diagnosis not present

## 2023-11-09 DIAGNOSIS — Z3A39 39 weeks gestation of pregnancy: Secondary | ICD-10-CM | POA: Diagnosis not present

## 2023-11-09 DIAGNOSIS — O4202 Full-term premature rupture of membranes, onset of labor within 24 hours of rupture: Secondary | ICD-10-CM | POA: Diagnosis not present

## 2023-11-09 DIAGNOSIS — O99820 Streptococcus B carrier state complicating pregnancy: Secondary | ICD-10-CM | POA: Diagnosis not present

## 2023-11-09 DIAGNOSIS — O99344 Other mental disorders complicating childbirth: Secondary | ICD-10-CM | POA: Diagnosis not present

## 2023-11-09 DIAGNOSIS — O24424 Gestational diabetes mellitus in childbirth, insulin controlled: Secondary | ICD-10-CM | POA: Diagnosis not present

## 2023-11-09 LAB — GLUCOSE, CAPILLARY
Glucose-Capillary: 78 mg/dL (ref 70–99)
Glucose-Capillary: 89 mg/dL (ref 70–99)
Glucose-Capillary: 92 mg/dL (ref 70–99)

## 2023-11-09 SURGERY — Surgical Case
Anesthesia: Epidural

## 2023-11-09 MED ORDER — LIDOCAINE-EPINEPHRINE (PF) 2 %-1:200000 IJ SOLN
INTRAMUSCULAR | Status: DC | PRN
  Administered 2023-11-09: 5 mL via EPIDURAL
  Administered 2023-11-09: 10 mL via EPIDURAL
  Administered 2023-11-09: 5 mL via EPIDURAL

## 2023-11-09 MED ORDER — FENTANYL CITRATE (PF) 100 MCG/2ML IJ SOLN
INTRAMUSCULAR | Status: DC | PRN
  Administered 2023-11-09: 100 ug via EPIDURAL

## 2023-11-09 MED ORDER — LACTATED RINGERS IV SOLN: 500.0000 mL | INTRAVENOUS | Status: DC | PRN

## 2023-11-09 MED ORDER — PRENATAL MULTIVITAMIN CH
1.0000 | ORAL_TABLET | Freq: Every day | ORAL | Status: DC
  Administered 2023-11-09 – 2023-11-10 (×2): 1 via ORAL
  Filled 2023-11-09 (×2): qty 1

## 2023-11-09 MED ORDER — SOD CITRATE-CITRIC ACID 500-334 MG/5ML PO SOLN: 30.0000 mL | ORAL | Status: DC | PRN

## 2023-11-09 MED ORDER — LORAZEPAM 0.5 MG PO TABS
1.0000 mg | ORAL_TABLET | Freq: Once | ORAL | Status: AC
  Administered 2023-11-09: 1 mg via ORAL
  Filled 2023-11-09: qty 2

## 2023-11-09 MED ORDER — DIPHENHYDRAMINE HCL 25 MG PO CAPS: 25.0000 mg | ORAL_CAPSULE | Freq: Four times a day (QID) | ORAL | Status: DC | PRN

## 2023-11-09 MED ORDER — ONDANSETRON HCL 4 MG PO TABS: 4.0000 mg | ORAL_TABLET | ORAL | Status: DC | PRN

## 2023-11-09 MED ORDER — FENTANYL CITRATE (PF) 100 MCG/2ML IJ SOLN
INTRAMUSCULAR | Status: AC
  Filled 2023-11-09: qty 2

## 2023-11-09 MED ORDER — LACTATED RINGERS IV SOLN: INTRAVENOUS | Status: DC

## 2023-11-09 MED ORDER — ACETAMINOPHEN 325 MG PO TABS: 650.0000 mg | ORAL_TABLET | ORAL | Status: DC | PRN

## 2023-11-09 MED ORDER — IBUPROFEN 600 MG PO TABS
600.0000 mg | ORAL_TABLET | Freq: Four times a day (QID) | ORAL | Status: DC
  Administered 2023-11-09 – 2023-11-10 (×6): 600 mg via ORAL
  Filled 2023-11-09 (×6): qty 1

## 2023-11-09 MED ORDER — OXYCODONE-ACETAMINOPHEN 5-325 MG PO TABS: 1.0000 | ORAL_TABLET | ORAL | Status: DC | PRN

## 2023-11-09 MED ORDER — SENNOSIDES-DOCUSATE SODIUM 8.6-50 MG PO TABS
2.0000 | ORAL_TABLET | Freq: Every day | ORAL | Status: DC
  Administered 2023-11-10: 2 via ORAL
  Filled 2023-11-09: qty 2

## 2023-11-09 MED ORDER — ONDANSETRON HCL 4 MG/2ML IJ SOLN: 4.0000 mg | INTRAMUSCULAR | Status: DC | PRN

## 2023-11-09 MED ORDER — BENZOCAINE-MENTHOL 20-0.5 % EX AERO
1.0000 | INHALATION_SPRAY | CUTANEOUS | Status: DC | PRN
  Administered 2023-11-09: 1 via TOPICAL
  Filled 2023-11-09: qty 56

## 2023-11-09 MED ORDER — FENTANYL-BUPIVACAINE-NACL 0.5-0.125-0.9 MG/250ML-% EP SOLN
EPIDURAL | Status: AC
  Filled 2023-11-09: qty 250

## 2023-11-09 MED ORDER — SODIUM CHLORIDE 0.9 % IV SOLN: 5.0000 10*6.[IU] | Freq: Once | INTRAVENOUS | Status: DC

## 2023-11-09 MED ORDER — SIMETHICONE 80 MG PO CHEW: 80.0000 mg | CHEWABLE_TABLET | ORAL | Status: DC | PRN

## 2023-11-09 MED ORDER — OXYCODONE HCL 5 MG PO TABS: 10.0000 mg | ORAL_TABLET | ORAL | Status: DC | PRN

## 2023-11-09 MED ORDER — COCONUT OIL OIL
1.0000 | TOPICAL_OIL | Status: DC | PRN
  Administered 2023-11-10: 1 via TOPICAL

## 2023-11-09 MED ORDER — ZOLPIDEM TARTRATE 5 MG PO TABS: 5.0000 mg | ORAL_TABLET | Freq: Every evening | ORAL | Status: DC | PRN

## 2023-11-09 MED ORDER — WITCH HAZEL-GLYCERIN EX PADS
1.0000 | MEDICATED_PAD | CUTANEOUS | Status: DC | PRN
  Administered 2023-11-09: 1 via TOPICAL

## 2023-11-09 MED ORDER — OXYCODONE-ACETAMINOPHEN 5-325 MG PO TABS: 2.0000 | ORAL_TABLET | ORAL | Status: DC | PRN

## 2023-11-09 MED ORDER — PENICILLIN G POT IN DEXTROSE 60000 UNIT/ML IV SOLN: 3.0000 10*6.[IU] | INTRAVENOUS | Status: DC

## 2023-11-09 MED ORDER — HYDROXYZINE HCL 50 MG PO TABS: 50.0000 mg | ORAL_TABLET | Freq: Four times a day (QID) | ORAL | Status: DC | PRN

## 2023-11-09 MED ORDER — LIDOCAINE HCL (PF) 1 % IJ SOLN: 30.0000 mL | INTRAMUSCULAR | Status: DC | PRN

## 2023-11-09 MED ORDER — POLYETHYLENE GLYCOL 3350 17 G PO PACK
17.0000 g | PACK | Freq: Every day | ORAL | Status: DC
  Administered 2023-11-10: 17 g via ORAL
  Filled 2023-11-09 (×2): qty 1

## 2023-11-09 MED ORDER — OXYTOCIN-SODIUM CHLORIDE 30-0.9 UT/500ML-% IV SOLN
2.5000 [IU]/h | INTRAVENOUS | Status: DC
  Filled 2023-11-09: qty 500

## 2023-11-09 MED ORDER — MORPHINE SULFATE (PF) 0.5 MG/ML IJ SOLN
INTRAMUSCULAR | Status: AC
Start: 2023-11-09 — End: 2023-11-09
  Filled 2023-11-09: qty 10

## 2023-11-09 MED ORDER — OXYTOCIN BOLUS FROM INFUSION
333.0000 mL | Freq: Once | INTRAVENOUS | Status: AC
  Administered 2023-11-09: 333 mL via INTRAVENOUS

## 2023-11-09 MED ORDER — FENTANYL CITRATE (PF) 100 MCG/2ML IJ SOLN
50.0000 ug | INTRAMUSCULAR | Status: DC | PRN
  Administered 2023-11-09: 100 ug via INTRAVENOUS
  Filled 2023-11-09: qty 2

## 2023-11-09 MED ORDER — ONDANSETRON HCL 4 MG/2ML IJ SOLN: 4.0000 mg | Freq: Four times a day (QID) | INTRAMUSCULAR | Status: DC | PRN

## 2023-11-09 MED ORDER — CEFAZOLIN SODIUM-DEXTROSE 2-4 GM/100ML-% IV SOLN
2.0000 g | Freq: Once | INTRAVENOUS | Status: AC | PRN
  Administered 2023-11-09: 2 g via INTRAVENOUS
  Filled 2023-11-09: qty 100

## 2023-11-09 MED ORDER — ACETAMINOPHEN 325 MG PO TABS
650.0000 mg | ORAL_TABLET | ORAL | Status: DC | PRN
  Administered 2023-11-09 – 2023-11-10 (×4): 650 mg via ORAL
  Filled 2023-11-09 (×4): qty 2

## 2023-11-09 MED ORDER — CEFAZOLIN SODIUM-DEXTROSE 2-3 GM-%(50ML) IV SOLR: INTRAVENOUS | Status: DC | PRN

## 2023-11-09 MED ORDER — DIBUCAINE (PERIANAL) 1 % EX OINT: 1.0000 | TOPICAL_OINTMENT | CUTANEOUS | Status: DC | PRN

## 2023-11-09 MED ORDER — OXYCODONE HCL 5 MG PO TABS
5.0000 mg | ORAL_TABLET | ORAL | Status: DC | PRN
  Administered 2023-11-09: 5 mg via ORAL
  Filled 2023-11-09: qty 1

## 2023-11-09 MED ORDER — LACTATED RINGERS IV SOLN: INTRAVENOUS | Status: DC | PRN

## 2023-11-09 NOTE — Progress Notes (Signed)
 In OR, SVE was performed to assess fetal station.  SVE: C/C/0, no cervix noted.  Discussed options with patient including proceeding with elective C-section vs trial of labor.  Reviewed that while there is no guarantee for successful VBAC at this time, she is complete and we could attempt pushing.  Question/concerns were addressed and pt does desire TOLAC.    Plan to return to room.  FHT: 145bpm  Keene Pastures, DO Attending Obstetrician & Gynecologist, [facility] for [employer], [facility]

## 2023-11-09 NOTE — Progress Notes (Signed)
 LABOR PROGRESS NOTE  Patient Name: Alison Sandoval, female   DOB: 08-Jun-1995, 28 y.o.  MRN: [id number]  Patient starting to feel frustrated with stall in progress and requesting a repeat cesarean for delivery. Cervix remains 9.5/90/0. Cat I. Discussed that she has a good chance for a vaginal delivery still and would need more time to achieve this. This is reasonable given reassuring maternal and fetal statuses. However patient declines continuation of TOLAC after thorough discussion of risks and benefits.  Alison Sandoval has agreed to proceed with cesarean section due to prior cesarean/decline to continue TOLAC. The risks of surgery were discussed with the patient including but were not limited to: bleeding which may require transfusion or reoperation; infection which may require antibiotics; injury to bowel, bladder, ureters or other surrounding organs; injury to the fetus; need for additional procedures including hysterectomy in the event of a life-threatening hemorrhage; formation of adhesions; placental abnormalities wth subsequent pregnancies; incisional problems; thromboembolic phenomenon and other postoperative/anesthesia complications. The patient concurred with the proposed plan, giving informed written consent for the procedure. Anesthesia and OR aware. Preoperative prophylactic antibiotics and SCDs ordered on call to the OR. To OR when ready.    Maud Sorenson, MD

## 2023-11-09 NOTE — Discharge Summary (Signed)
 Postpartum Discharge Summary  Date of Service updated***     Patient Name: Alison Sandoval DOB: Jul 23, 1995 MRN: [id number]  Date of admission: 11/08/2023 Delivery date:11/09/2023 Delivering provider: Maud Sorenson Date of discharge: 11/09/2023  Admitting diagnosis: Gestational diabetes mellitus, class A2 [O24.419] Intrauterine pregnancy: [redacted]w[redacted]d     Secondary diagnosis:  Principal Problem:   Gestational diabetes mellitus, class A2 Active Problems:   Postpartum depression/anxiety   Supervision of high risk pregnancy, antepartum   Previous cesarean section  Additional problems: ***    Discharge diagnosis: VBAC and GDM A2                                              Post partum procedures:{Postpartum procedures:23558} Augmentation: Pitocin  Complications: {OB Labor/Delivery Complications:20784}  Hospital course: Induction of Labor With Vaginal Delivery   28 y.o. yo G2P1001 at [redacted]w[redacted]d was admitted to the hospital 11/08/2023 for induction of labor.  Indication for induction: A2 DM.  Patient had an labor course complicated by*** Membrane Rupture Time/Date: 2:25 PM,11/08/2023  Delivery Method:VBAC, Vacuum Assisted Operative Delivery:Device used:vacuum Indication: Maternal exhaustion Episiotomy: None Lacerations:  3rd degree;Perineal Details of delivery can be found in separate delivery note.  Patient had a postpartum course complicated by***. Patient is discharged home 11/09/23.  Newborn Data: Birth date:11/09/2023 Birth time:7:42 AM Gender:Female Living status:Living Apgars:9 ,9  Weight:   Magnesium Sulfate received: {Mag received:30440022} BMZ received: {BMZ received:30440023} Rhophylac:{Rhophylac received:30440032} IEP:{PIR:51884166} T-DaP:{Tdap:23962} Flu: {AYT:01601} RSV Vaccine received: {RSV:31013} Transfusion:{Transfusion received:30440034} Immunizations administered: Immunization History  Administered Date(s) Administered   Influenza, Seasonal, Injecte,  Preservative Fre 05/02/2023   Influenza,inj,Quad PF,6+ Mos 01/23/2018   MMR 06/19/2018   Tdap 03/29/2018, 09/12/2023    Physical exam  Vitals:   11/09/23 0631 11/09/23 0651 11/09/23 0700 11/09/23 0806  BP: 113/79 (!) 118/59 109/68 (!) 135/59  Pulse: (!) 136 (!) 115 (!) 114 (!) 135  Resp: 18 18    Temp:      TempSrc:      SpO2:    99%  Weight:      Height:       General: {Exam; general:21111117} Lochia: {Desc; appropriate/inappropriate:30686::appropriate} Uterine Fundus: {Desc; firm/soft:30687} Incision: {Exam; incision:21111123} DVT Evaluation: {Exam; dvt:2111122} Labs: Lab Results  Component Value Date   WBC 12.9 (H) 11/08/2023   HGB 9.5 (L) 11/08/2023   HCT 31.7 (L) 11/08/2023   MCV 74.2 (L) 11/08/2023   PLT 237 11/08/2023      Latest Ref Rng & Units 09/03/2023    9:54 PM  CMP  Glucose 70 - 99 mg/dL 93   BUN 6 - 20 mg/dL <5   Creatinine 0.93 - 1.00 mg/dL 2.35   Sodium 573 - 220 mmol/L 134   Potassium 3.5 - 5.1 mmol/L 3.6   Chloride 98 - 111 mmol/L 108   CO2 22 - 32 mmol/L 17   Calcium 8.9 - 10.3 mg/dL 8.7   Total Protein 6.5 - 8.1 g/dL 6.7   Total Bilirubin 0.0 - 1.2 mg/dL 0.6   Alkaline Phos 38 - 126 U/L 164   AST 15 - 41 U/L 17   ALT 0 - 44 U/L 7    Edinburgh Score:    09/05/2018   11:47 AM  Edinburgh Postnatal Depression Scale Screening Tool  I have been able to laugh and see the funny side of things. 2  I have looked forward  with enjoyment to things. 2  I have blamed myself unnecessarily when things went wrong. 2  I have been anxious or worried for no good reason. 3  I have felt scared or panicky for no good reason. 3  Things have been getting on top of me. 2  I have been so unhappy that I have had difficulty sleeping. 3  I have felt sad or miserable. 2  I have been so unhappy that I have been crying. 2  The thought of harming myself has occurred to me. 2  Edinburgh Postnatal Depression Scale Total 23      Data saved with a previous flowsheet  row definition      After visit meds:  Allergies as of 11/09/2023       Reactions   Caramel Rash     Med Rec must be completed prior to using this [facility]***        Discharge home in stable condition Infant Feeding: Breast Infant Disposition:{CHL IP OB HOME WITH WUJWJX:91478} Discharge instruction: per After Visit Summary and Postpartum booklet. Activity: Advance as tolerated. Pelvic rest for 6 weeks.  Diet: {OB diet:21111121} Anticipated Birth Control: IUD Postpartum Appointment:{Outpatient follow up:23559} Additional Postpartum F/U: {PP Procedure:23957} Future Appointments:No future appointments. Follow up Visit:      11/09/2023 Ezequiel Holm, MD

## 2023-11-09 NOTE — Anesthesia Postprocedure Evaluation (Signed)
 Anesthesia Post Note  Patient: Monserrath Junio  Procedure(s) Performed: AN AD HOC LABOR EPIDURAL     Patient location during evaluation: Mother Baby Anesthesia Type: Epidural Level of consciousness: awake and alert Pain management: pain level controlled Vital Signs Assessment: post-procedure vital signs reviewed and stable Respiratory status: spontaneous breathing, nonlabored ventilation and respiratory function stable Cardiovascular status: stable Postop Assessment: no headache, no backache and epidural receding Anesthetic complications: no   No notable events documented.  Last Vitals:  Vitals:   11/09/23 1145 11/09/23 1540  BP: 120/76 124/70  Pulse: (!) 126 (!) 102  Resp: 18 18  Temp: 36.8 C (!) 36.4 C  SpO2: 100% 100%    Last Pain:  Vitals:   11/09/23 1825  TempSrc:   PainSc: 6    Pain Goal:                   Karter Haire

## 2023-11-09 NOTE — Progress Notes (Signed)
 LABOR PROGRESS NOTE  Patient Name: Alison Sandoval, female   DOB: [birth year], 28 y.o.  MRN: [id number]  Patient feeling lots of pressure. Cervix 9/100/0 for one hour. Pitocin  previously stopped for fetal indications; will restart. Currently Cat I. Anticipate VBAC.  Maud Sorenson, MD

## 2023-11-09 NOTE — Transfer of Care (Signed)
 Immediate Anesthesia Transfer of Care Note  Patient: Alison Sandoval  Procedure(s) Performed: CESAREAN DELIVERY  Patient Location: L  & D  Anesthesia Type:Epidural  Level of Consciousness: awake, alert , and oriented  Airway & Oxygen Therapy: Patient Spontanous Breathing  Post-op Assessment: Report given to RN and Post -op Vital signs reviewed and stable  Post vital signs: Reviewed and stable  Last Vitals:  Vitals Value Taken Time  BP 106/52 11/09/23 06:00  Temp    Pulse 157 11/09/23 06:00  Resp    SpO2 98 % 11/09/23 05:59  Vitals shown include unfiled device data.  Last Pain:  Vitals:   11/09/23 0410  TempSrc: Oral  PainSc:          Complications: No notable events documented.

## 2023-11-10 ENCOUNTER — Other Ambulatory Visit

## 2023-11-10 ENCOUNTER — Other Ambulatory Visit (HOSPITAL_COMMUNITY): Payer: Self-pay

## 2023-11-10 DIAGNOSIS — D62 Acute posthemorrhagic anemia: Secondary | ICD-10-CM | POA: Diagnosis not present

## 2023-11-10 LAB — CBC
HCT: 21.5 % — ABNORMAL LOW (ref 36.0–46.0)
HCT: 24.2 % — ABNORMAL LOW (ref 36.0–46.0)
Hemoglobin: 6.6 g/dL — CL (ref 12.0–15.0)
Hemoglobin: 7.4 g/dL — ABNORMAL LOW (ref 12.0–15.0)
MCH: 23 pg — ABNORMAL LOW (ref 26.0–34.0)
MCH: 23.3 pg — ABNORMAL LOW (ref 26.0–34.0)
MCHC: 30.7 g/dL (ref 30.0–36.0)
MCHC: 30.6 g/dL (ref 30.0–36.0)
MCV: 74.9 fL — ABNORMAL LOW (ref 80.0–100.0)
MCV: 76.1 fL — ABNORMAL LOW (ref 80.0–100.0)
Platelets: 209 10*3/uL (ref 150–400)
Platelets: 196 10*3/uL (ref 150–400)
RBC: 2.87 MIL/uL — ABNORMAL LOW (ref 3.87–5.11)
RBC: 3.18 MIL/uL — ABNORMAL LOW (ref 3.87–5.11)
RDW: 18.8 % — ABNORMAL HIGH (ref 11.5–15.5)
RDW: 19 % — ABNORMAL HIGH (ref 11.5–15.5)
WBC: 19.6 10*3/uL — ABNORMAL HIGH (ref 4.0–10.5)
WBC: 18 10*3/uL — ABNORMAL HIGH (ref 4.0–10.5)
nRBC: 0 % (ref 0.0–0.2)
nRBC: 0 % (ref 0.0–0.2)

## 2023-11-10 LAB — PREPARE RBC (CROSSMATCH)

## 2023-11-10 LAB — GLUCOSE, CAPILLARY: Glucose-Capillary: 114 mg/dL — ABNORMAL HIGH (ref 70–99)

## 2023-11-10 LAB — BPAM RBC
Blood Product Expiration Date: 202506242359
ISSUE DATE / TIME: 202506201304
Unit Type and Rh: 9500

## 2023-11-10 LAB — TYPE AND SCREEN: Unit division: 0

## 2023-11-10 MED ORDER — POLYETHYLENE GLYCOL 3350 17 G PO PACK: 17.0000 g | PACK | Freq: Every day | ORAL | 1 refills | Status: DC

## 2023-11-10 MED ORDER — ACETAMINOPHEN 500 MG PO TABS: 1000.0000 mg | ORAL_TABLET | Freq: Four times a day (QID) | ORAL | Status: DC | PRN

## 2023-11-10 MED ORDER — ACETAMINOPHEN 500 MG PO TABS
1000.0000 mg | ORAL_TABLET | Freq: Four times a day (QID) | ORAL | Status: DC | PRN
Start: 2023-11-10 — End: 2023-11-10

## 2023-11-10 MED ORDER — SODIUM CHLORIDE 0.9% IV SOLUTION: Freq: Once | INTRAVENOUS | Status: DC

## 2023-11-10 MED ORDER — METAMUCIL SMOOTH TEXTURE 58.6 % PO POWD
1.0000 | Freq: Three times a day (TID) | ORAL | 12 refills | Status: DC
Start: 2023-11-10 — End: 2023-12-28
  Filled 2023-11-10: qty 283, fill #0

## 2023-11-10 MED ORDER — POLYETHYLENE GLYCOL 3350 17 GM/SCOOP PO POWD
17.0000 g | Freq: Two times a day (BID) | ORAL | 1 refills | Status: DC | PRN
  Filled 2023-11-10: qty 476, 28d supply, fill #0

## 2023-11-10 MED ORDER — IBUPROFEN 600 MG PO TABS: 600.0000 mg | ORAL_TABLET | Freq: Four times a day (QID) | ORAL | 1 refills | Status: DC

## 2023-11-10 MED ORDER — IBUPROFEN 600 MG PO TABS
600.0000 mg | ORAL_TABLET | Freq: Four times a day (QID) | ORAL | 1 refills | Status: DC
  Filled 2023-11-10: qty 30, 8d supply, fill #0

## 2023-11-10 NOTE — Lactation Note (Signed)
 This note was copied from a baby's chart. Lactation Consultation Note  Patient Name: Alison Sandoval Date: 11/10/2023 Age:28 hours  Reason for consult: Term;Primapara;1st time breastfeeding;Initial assessment;Breastfeeding assistance  P1, [redacted]w[redacted]d Initial LC visit. Mother was receptive and requesting assistance with latching. She states baby has been latching and her right nipple is sore. Mother requesting help on the right breast.  Mother assisted in football hold on the right breast with pillows for support. Mother was taught hand expression before latching baby. After a few attempts baby latched well and suckled. Mother felt initial discomfort that eased off quickly. Mother was taught alternate breast compression and infant remained engaged with the feeding. Intermittent swallowing noted. Father engaged and eager to help mother with positioning, latching and baby care.   Discussed OP Lactation Services and support at [facility] and mother requested a referral for OP Lactation consultation for continued breastfeeding support.  Mother encouraged to latch baby with feeding cues, place baby skin to skin if not latching, and call for assistance with breastfeeding as needed. Anticipate now that baby is over 24 hours of age, baby will breastfeed more often 8-12 plus times in 24 hours and may cluster feed.      Maternal Data Has patient been taught Hand Expression?: Yes Does the patient have breastfeeding experience prior to this delivery?: No  Feeding Mother's Current Feeding Choice: Breast Milk and Formula Nipple Type: Slow - flow  LATCH Score Latch: Repeated attempts needed to sustain latch, nipple held in mouth throughout feeding, stimulation needed to elicit sucking reflex.  Audible Swallowing: Spontaneous and intermittent  Type of Nipple: Everted at rest and after stimulation  Comfort (Breast/Nipple): Soft / non-tender  Hold (Positioning): Assistance needed to  correctly position infant at breast and maintain latch.  LATCH Score: 8   Lactation Tools Discussed/Used    Interventions Interventions: Breast feeding basics reviewed;Assisted with latch;Skin to skin;Hand express;Breast compression;Adjust position;Support pillows;Education;LC Services brochure;CDC milk storage guidelines;CDC Guidelines for Breast Pump Cleaning  Discharge Discharge Education: Engorgement and breast care;Warning signs for feeding baby;Outpatient recommendation;Outpatient Epic message sent Pump: Personal (mother reports her pump has been ordered through her insurance, waiting arrival)  Consult Status Consult Status: Follow-up Date: 11/11/23    Esperanza Hedges 11/10/2023, 12:05 PM

## 2023-11-10 NOTE — Social Work (Signed)
 CSW received consult for hx of Postpartum Anxiety and Depression.  CSW met with MOB to offer support and complete assessment. CSW entered the room and observed MOB resting in bed holding the infant and FOB a bedside. CSW introduced self, CSW role and reason for visit. MOB was agreeable to visit and allowed FOB to remain in the room. CSW inquired about how MOB was feeling, MOB reported tired. CSW inquired about MOB MH hx, MOB reported she experienced PPA after he first child. MOB reported she worried about the health of her baby, she was questioning if she was doing things right and becoming a first time mom were all factors. MOB reported she was prescribed medication at the time. MOB reported a stable mood and stated she feels much moe prepared this time. CSW provided education regarding the baby blues period vs. perinatal mood disorders, discussed treatment and gave resources for mental health follow up if concerns arise.  CSW recommends self-evaluation during the postpartum time period using the New Mom Checklist from Postpartum Progress and encouraged MOB to contact a medical professional if symptoms are noted at any time.  MOB identified FOB, her sister and mom as her supports.  CSW provided review of Sudden Infant Death Syndrome (SIDS) precautions.  MOB reported she has all essential items for the infant including a bassinet and car seat. CSW identifies no further need for intervention and no barriers to discharge at this time.  Jarnell Cordaro, LCSWA Clinical Social Worker [phone]

## 2023-11-11 LAB — TYPE AND SCREEN
ABO/RH(D): O POS
Antibody Screen: NEGATIVE

## 2023-11-11 LAB — BPAM RBC

## 2023-11-13 ENCOUNTER — Ambulatory Visit: Payer: Self-pay | Admitting: Women's Health

## 2023-11-14 ENCOUNTER — Encounter: Admitting: Women's Health

## 2023-11-14 ENCOUNTER — Other Ambulatory Visit

## 2023-11-17 ENCOUNTER — Encounter: Admitting: Advanced Practice Midwife

## 2023-11-18 ENCOUNTER — Telehealth (HOSPITAL_COMMUNITY): Payer: Self-pay

## 2023-11-18 NOTE — Telephone Encounter (Signed)
 11/18/2023 1922  Name: Alison Sandoval MRN: [id number] DOB: [birth year]  Reason for Call:  Transition of Care Hospital Discharge Call  Contact Status: Patient Contact Status: Unable to contact (No answer, No voicemail option)  Language assistant needed:          Follow-Up Questions:    Van Postnatal Depression Scale:  In the Past 7 Days:    PHQ2-9 Depression Scale:     Discharge Follow-up:    Post-discharge interventions: NA  [facility] Rosaline Deretha PEAK

## 2023-12-20 ENCOUNTER — Ambulatory Visit: Admitting: Women's Health

## 2023-12-20 ENCOUNTER — Other Ambulatory Visit

## 2023-12-20 DIAGNOSIS — Z8632 Personal history of gestational diabetes: Secondary | ICD-10-CM

## 2023-12-21 ENCOUNTER — Ambulatory Visit: Admitting: Women's Health

## 2023-12-21 ENCOUNTER — Encounter: Payer: Self-pay | Admitting: Women's Health

## 2023-12-21 DIAGNOSIS — Z1332 Encounter for screening for maternal depression: Secondary | ICD-10-CM | POA: Diagnosis not present

## 2023-12-21 DIAGNOSIS — O165 Unspecified maternal hypertension, complicating the puerperium: Secondary | ICD-10-CM | POA: Insufficient documentation

## 2023-12-21 DIAGNOSIS — Z8632 Personal history of gestational diabetes: Secondary | ICD-10-CM

## 2023-12-21 DIAGNOSIS — Z3009 Encounter for other general counseling and advice on contraception: Secondary | ICD-10-CM

## 2023-12-21 MED ORDER — AMLODIPINE BESYLATE 5 MG PO TABS: 5.0000 mg | ORAL_TABLET | Freq: Every day | ORAL | 0 refills | Status: DC

## 2023-12-21 NOTE — Patient Instructions (Signed)
NO SEX UNTIL AFTER YOU GET YOUR BIRTH CONTROL   You will have your sugar test next visit.  Please do not eat or drink anything after midnight the night before you come, not even water.  You will be here for at least two hours.  Please make an appointment online for the bloodwork at [email or web address] for 8:30am (or as close to this as possible). Make sure you select the [facility] service center. The day of the appointment, check in with our office first, then you will go to [facility] to start the sugar test.

## 2023-12-21 NOTE — Progress Notes (Signed)
 POSTPARTUM VISIT Patient name: Alison Sandoval MRN [id number]  Date of birth: Aug 07, 1995 Chief Complaint:   Postpartum Care  History of Present Illness:   Alison Sandoval is a 28 y.o. G76P2002 African American female being seen today for a postpartum visit. She is 6 weeks postpartum following a vaginal birth after cesarean (VBAC) and vacuum, low at 39.1 gestational weeks. IOL: yes, for diabetes mellitus A2DM . Anesthesia: epidural.  Laceration: 3rd degree.  Complications: PPH , 1uPRBC. Inpatient contraception: no.   Pregnancy complicated by A2DM. Tobacco use: no. Substance use disorder: no. Last pap smear: 01/03/23 and results were NILM w/ HRHPV negative. Next pap smear due: 2027 Patient's last menstrual period was 02/08/2023 (exact date).  Postpartum course has been uncomplicated. Bleeding intermittent. Bowel function is normal. Bladder function is normal. Urinary incontinence? no, fecal incontinence? no Patient is sexually active. Last sexual activity: 7/19. Desired contraception: wants Mirena . Patient does want a pregnancy in the future.  Desired family size is 3 children.   Upstream - 12/21/23 1558       Pregnancy Intention Screening   Does the patient want to become pregnant in the next year? No    Does the patient's partner want to become pregnant in the next year? No    Would the patient like to discuss contraceptive options today? Yes      Contraception Wrap Up   Current Method Abstinence    Contraception Counseling Provided Yes         The pregnancy intention screening data noted above was reviewed. Potential methods of contraception were discussed. The patient elected to proceed with No data recorded.  Edinburgh Postpartum Depression Screening: negative  Edinburgh Postnatal Depression Scale - 12/21/23 1553       Edinburgh Postnatal Depression Scale:  In the Past 7 Days   I have been able to laugh and see the funny side of things. 0    I have looked forward with  enjoyment to things. 0    I have blamed myself unnecessarily when things went wrong. 1    I have been anxious or worried for no good reason. 0    I have felt scared or panicky for no good reason. 2    Things have been getting on top of me. 0    I have been so unhappy that I have had difficulty sleeping. 0    I have felt sad or miserable. 0    I have been so unhappy that I have been crying. 0    The thought of harming myself has occurred to me. 0    Edinburgh Postnatal Depression Scale Total 3             05/02/2023   10:32 AM 01/03/2023    1:27 PM  GAD 7 : Generalized Anxiety Score  Nervous, Anxious, on Edge 0 3  Control/stop worrying 0 3  Worry too much - different things 1 3  Trouble relaxing 1 2  Restless 0 1  Easily annoyed or irritable 0 1  Afraid - awful might happen 1 2  Total GAD 7 Score 3 15     Baby's course has been uncomplicated. Baby is feeding by bottle. Infant has a pediatrician/family doctor? Yes.  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  Pt has material needs met for her and baby: Yes.   Review of Systems:   Pertinent items are noted in HPI Denies Abnormal vaginal discharge w/ itching/odor/irritation, headaches, visual  changes, shortness of breath, chest pain, abdominal pain, severe nausea/vomiting, or problems with urination or bowel movements. Pertinent History Reviewed:  Reviewed past medical,surgical, obstetrical and family history.  Reviewed problem list, medications and allergies. OB History  Gravida Para Term Preterm AB Living  2 2 2   2   SAB IAB Ectopic Multiple Live Births     0 2    # Outcome Date GA Lbr Len/2nd Weight Sex Type Anes PTL Lv  2 Term 11/09/23 [redacted]w[redacted]d / 02:07 6 lb 15.1 oz (3.15 kg) F VBAC, Vacuum EPI  LIV  1 Term 06/16/18 [redacted]w[redacted]d  7 lb 0.7 oz (3.195 kg) M CS-LTranv EPI N LIV     Complications: Fetal Intolerance   Physical Assessment:   Vitals:   12/21/23 1551 12/21/23 1637  BP: (!) 144/94 (!) 143/93   Pulse: 78 94  Weight: 226 lb (102.5 kg)   Height: 5' 4 (1.626 m)   Body mass index is 38.79 kg/m.       Physical Examination:   General appearance: alert, well appearing, and in no distress  Mental status: alert, oriented to person, place, and time  Skin: warm & dry   Cardiovascular: normal heart rate noted   Respiratory: normal respiratory effort, no distress   Breasts: deferred, no complaints   Abdomen: soft, non-tender   Pelvic: lac well healed. Thin prep pap obtained: No  Rectal: not examined  Extremities: Edema: none   Chaperone: Alan Fischer       No results found for this or any previous visit (from the past 24 hours).  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  Mood and well being:  If positive depression screen, discussed and plan developed.  If using tobacco we discussed reduction/cessation and risk of relapse If current substance abuse, we discussed and referral to local resources was offered.   2. Infant care and feeding:  If breastfeeding, discussed returning to work, pumping, breastfeeding-associated pain, guidance regarding return to fertility while lactating if not using another method. If needed, patient was provided with a letter to be allowed to pump q 2-3hrs to support lactation in a private location with access to a refrigerator to store breastmilk.   Recommended that all caregivers be immunized for flu, pertussis and other preventable communicable diseases If pt does not have material needs met for her/baby, referred to local resources for help obtaining  these.  3. Sexuality, contraception and birth spacing Provided guidance regarding sexuality, management of dyspareunia, and resumption of intercourse Discussed avoiding interpregnancy interval <54mths and recommended birth spacing of 18 months  4. Sleep and fatigue Discussed coping options for fatigue and sleep disruption Encouraged family/partner/community support of 4 hrs of uninterrupted sleep to help with mood and fatigue  5. Physical recovery  If pt had a C/S, assessed incisional pain and providing guidance on normal vs prolonged recovery If pt had a laceration, perineal healing and pain reviewed.  If urinary or fecal incontinence, discussed management and referred to PT or uro/gyn if indicated  Patient is safe to resume physical activity. Discussed attainment of healthy weight.  6.  Chronic  disease management Discussed pregnancy complications if any, and their implications for future childbearing and long-term maternal health. Review recommendations for prevention of recurrent pregnancy complications, such as 17 hydroxyprogesterone caproate to reduce risk for recurrent PTB not applicable, or aspirin  to reduce risk of preeclampsia not applicable. Pt had GDM: yes. If yes, 2hr GTT scheduled: yes. Reviewed medications and non-pregnant dosing including consideration of whether pt is breastfeeding using a reliable resource such as LactMed: not applicable Referred for f/u w/ PCP or subspecialist providers as indicated: yes  7. Health maintenance Mammogram at 28yo or earlier if indicated Pap smears as indicated  Meds:  Meds ordered this encounter  Medications   amLODipine  (NORVASC ) 5 MG tablet    Sig: Take 1 tablet (5 mg total) by mouth daily.    Dispense:  30 tablet    Refill:  0    Follow-up: Return for next week for sugar test and IUD insertion/bp check.   No orders of the defined types were placed in this encounter.   Suzen JONELLE Fetters CNM, [facility] 12/21/2023 4:39 PM

## 2023-12-28 ENCOUNTER — Ambulatory Visit (INDEPENDENT_AMBULATORY_CARE_PROVIDER_SITE_OTHER): Admitting: Advanced Practice Midwife

## 2023-12-28 ENCOUNTER — Other Ambulatory Visit

## 2023-12-28 ENCOUNTER — Encounter: Payer: Self-pay | Admitting: Advanced Practice Midwife

## 2023-12-28 VITALS — BP 131/84 | HR 86

## 2023-12-28 DIAGNOSIS — Z30430 Encounter for insertion of intrauterine contraceptive device: Secondary | ICD-10-CM | POA: Insufficient documentation

## 2023-12-28 DIAGNOSIS — Z3202 Encounter for pregnancy test, result negative: Secondary | ICD-10-CM

## 2023-12-28 LAB — POCT URINE PREGNANCY: Preg Test, Ur: NEGATIVE

## 2023-12-28 MED ORDER — LEVONORGESTREL 20 MCG/DAY IU IUD
1.0000 | INTRAUTERINE_SYSTEM | Freq: Once | INTRAUTERINE | Status: AC
  Administered 2023-12-28: 1 via INTRAUTERINE

## 2023-12-28 NOTE — Patient Instructions (Signed)
 Nothing in vagina for 3 days (no sex, douching, tampons, etc...) Check your strings once a month to make sure you can feel them, if you are not able to please let us know If you develop a fever of 100.4 or more in the next few weeks, or if you develop severe abdominal pain, please let [place] know Use a backup method of birth control, such as condoms, for 2 weeks

## 2023-12-28 NOTE — Addendum Note (Signed)
 Addended by: SANNA GONG A on: 12/28/2023 10:25 AM   Modules accepted: Orders

## 2023-12-28 NOTE — Progress Notes (Signed)
 Alison Sandoval is a 28 y.o. year old  female  who presents for placement of a mIRENA  IUD. Her LMP was 12/07/23 and her pregnancy test today is negative.    Vitals:   12/28/23 0928  BP: 131/84  Pulse: 86    Stopped taking norvasc   The risks and benefits of the method and placement have been thouroughly reviewed with the patient and all questions were answered.  Specifically the patient is aware of failure rate of 05/998, expulsion of the IUD and of possible perforation.  The patient is aware of irregular bleeding due to the method and understands the incidence of irregular bleeding diminishes with time.  Time out was performed.  A Graves speculum was placed.  The cervix was prepped using Betadine. The uterus was found to be neutral and it sounded to 8 cm.  The cervix was grasped with a tenaculum and the IUD was inserted to 8 cm.  It was pulled back 1 cm and the IUD was disengaged.  The strings were trimmed to 3 cm.  Sonogram was performed and the proper placement of the IUD was verified.  The patient was instructed on signs and symptoms of infection and to check for the strings after each menses or each month.  The patient is to refrain from intercourse for 3 days.  The patient is scheduled for a return appointment after her first menses or 4 weeks.  [name] 12/28/2023 10:00 AM

## 2024-01-11 ENCOUNTER — Other Ambulatory Visit

## 2024-01-11 DIAGNOSIS — Z8632 Personal history of gestational diabetes: Secondary | ICD-10-CM

## 2024-01-22 ENCOUNTER — Encounter: Payer: Self-pay | Admitting: Advanced Practice Midwife

## 2024-01-25 ENCOUNTER — Ambulatory Visit: Admitting: Advanced Practice Midwife

## 2024-01-25 ENCOUNTER — Encounter: Payer: Self-pay | Admitting: Advanced Practice Midwife

## 2024-01-25 VITALS — BP 138/82 | HR 86 | Ht 64.0 in | Wt 231.0 lb

## 2024-01-25 DIAGNOSIS — N921 Excessive and frequent menstruation with irregular cycle: Secondary | ICD-10-CM | POA: Diagnosis not present

## 2024-01-25 DIAGNOSIS — Z30431 Encounter for routine checking of intrauterine contraceptive device: Secondary | ICD-10-CM | POA: Diagnosis not present

## 2024-01-25 LAB — POCT HEMOGLOBIN: Hemoglobin: 9.7 g/dL — AB (ref 11–14.6)

## 2024-01-25 MED ORDER — FERROUS SULFATE 325 (65 FE) MG PO TABS: 325.0000 mg | ORAL_TABLET | ORAL | 2 refills | Status: AC

## 2024-01-25 MED ORDER — MEGESTROL ACETATE 40 MG PO TABS: ORAL_TABLET | ORAL | 3 refills | Status: AC

## 2024-01-25 NOTE — Progress Notes (Signed)
 History:  28 y.o. [id number] here today for today for IUD string check; Mirena  IUD was placed  12/28/23. Has bled heavy and often since getting it put it in, today it is light. Is able to check strings. .Had blood transfusion after labor, last Hgb on file 7.4.  Doesn't plan to repeat GTT.  The following portions of the patient's history were reviewed and updated as appropriate: allergies, current medications, past family history, past medical history, past social history, past surgical history and problem list.  Review of Systems:   Constitutional: Negative for fever and chills Eyes: Negative for visual disturbances Respiratory: Negative for shortness of breath, dyspnea Cardiovascular: Negative for chest pain or palpitations  Gastrointestinal: Negative for vomiting, diarrhea and constipation Genitourinary: Negative for dysuria and urgency Musculoskeletal: Negative for back pain, joint pain, myalgias  Neurological: Negative for dizziness and headaches    Objective:  Physical Exam Blood pressure 138/82, pulse 86, height 5' 4 (1.626 m), weight 231 lb (104.8 kg), last menstrual period 12/07/2023, not currently breastfeeding. Gen: NAD Abd: Soft, nontender and nondistended Pelvic: Bedside US  reveals properly place IUD.   Assessment & Plan:  Normal IUD check. Patient to keep IUD in place for 8 years; can come in for removal if she desires pregnancy.  Take FeSO4 every other day for a month.

## 2024-01-25 NOTE — Addendum Note (Signed)
 Addended by: ILEAN RUTHERFORD HERO on: 01/25/2024 11:51 AM   Modules accepted: Orders

## 2024-03-22 DIAGNOSIS — L600 Ingrowing nail: Secondary | ICD-10-CM | POA: Diagnosis not present

## 2024-05-28 ENCOUNTER — Other Ambulatory Visit: Payer: Self-pay

## 2024-05-28 ENCOUNTER — Ambulatory Visit
Admission: EM | Admit: 2024-05-28 | Discharge: 2024-05-28 | Disposition: A | Discharge status: Home / Self Care | Attending: Physician Assistant | Admitting: Physician Assistant

## 2024-05-28 DIAGNOSIS — R112 Nausea with vomiting, unspecified: Secondary | ICD-10-CM

## 2024-05-28 DIAGNOSIS — R197 Diarrhea, unspecified: Secondary | ICD-10-CM | POA: Diagnosis not present

## 2024-05-28 DIAGNOSIS — R1011 Right upper quadrant pain: Secondary | ICD-10-CM | POA: Diagnosis not present

## 2024-05-28 MED ORDER — FAMOTIDINE 20 MG PO TABS: 20.0000 mg | ORAL_TABLET | Freq: Two times a day (BID) | ORAL | 0 refills | Status: AC

## 2024-05-28 MED ORDER — ONDANSETRON 4 MG PO TBDP: 4.0000 mg | ORAL_TABLET | Freq: Three times a day (TID) | ORAL | 0 refills | Status: AC | PRN

## 2024-05-28 NOTE — ED Provider Notes (Signed)
 " [facility]    CSN: [id number] Arrival date & time: 05/28/24  1213      History   Chief Complaint Chief Complaint  Patient presents with   Diarrhea   Emesis    HPI Alison Sandoval is a 29 y.o. female.   HPI   Pt is here today with concerns for abdominal symptoms. She states she is having severe pain with eating and anytime she eats she will vomit. She reports that between eating and vomiting she will have severe abdominal pain. She has been able to keep some fluids down- water, ginger ale and blue Gatorade. She states this started prior to 12/25 and shortly after that she contracted the flu. She states these symptoms have persisted and now she is at the point that she would prefer to not eat. When she vomits it is mostly acid/yellow in color. She reports abdominal pain is predominantly along the upper abdomen and she will start to have swelling/bloating after eating. She describes the pain as sharp and burning at times. She has also been having diarrhea since 12/25 in addition to nausea and vomiting.   She denies bloody vomitus or blood in bowel movements Interventions: she tried a famotidine  from her mother and this seemed to help with her bowel movements. She sometimes takes TUMS which provides relief for a few hours at the most   Past Medical History:  Diagnosis Date   Chronic kidney disease    UTI   Diabetes mellitus without complication (HCC)    GERD (gastroesophageal reflux disease)    Gestational diabetes    Headache     Patient Active Problem List   Diagnosis Date Noted   Encounter for IUD insertion 12/28/2023   History of gestational diabetes 11/08/2023   Previous cesarean section 05/02/2023    Past Surgical History:  Procedure Laterality Date   CESAREAN SECTION N/A 06/16/2018   Procedure: CESAREAN SECTION;  Surgeon: Alger Gong, MD;  Location: [facility];  Service: Obstetrics;  Laterality: N/A;    OB History     Gravida  2    Para  2   Term  2   Preterm      AB      Living  2      SAB      IAB      Ectopic      Multiple  0   Live Births  2            Home Medications    Prior to Admission medications  Medication Sig Start Date End Date Taking? Authorizing Provider  famotidine  (PEPCID ) 20 MG tablet Take 1 tablet (20 mg total) by mouth 2 (two) times daily. 05/28/24  Yes Money Mckeithan E, PA-C  ondansetron  (ZOFRAN -ODT) 4 MG disintegrating tablet Take 1 tablet (4 mg total) by mouth every 8 (eight) hours as needed for nausea or vomiting. 05/28/24  Yes Vicci Reder E, PA-C  ferrous sulfate  325 (65 FE) MG tablet Take 1 tablet (325 mg total) by mouth every other day. 01/25/24   Cresenzo-Dishmon, Cathlean, CNM  megestrol  (MEGACE ) 40 MG tablet Take 1-3/day (at the same time) PO prn bleeding Patient not taking: Reported on 05/31/2024 01/25/24   Cresenzo-Dishmon, Cathlean, CNM  norethindrone  (MICRONOR ) 0.35 MG tablet Take 1 tablet (0.35 mg total) by mouth daily. 05/31/24   Delores Nidia LITTIE, FNP    Family History Family History  Problem Relation Age of Onset   Hypertension Paternal Grandmother  Hyperlipidemia Maternal Grandmother    Hypertension Father    Hyperlipidemia Father    Hypertension Mother    Asthma Mother     Social History Social History[1]   Allergies   Caramel   Review of Systems Review of Systems  Constitutional:  Positive for fatigue. Negative for chills and fever.  Gastrointestinal:  Positive for abdominal pain, diarrhea, nausea and vomiting. Negative for blood in stool.     Physical Exam Triage Vital Signs ED Triage Vitals  Encounter Vitals Group     BP 05/28/24 1317 (!) 142/96     Girls Systolic BP Percentile --      Girls Diastolic BP Percentile --      Boys Systolic BP Percentile --      Boys Diastolic BP Percentile --      Pulse Rate 05/28/24 1317 77     Resp 05/28/24 1317 18     Temp 05/28/24 1317 98.1 F (36.7 C)     Temp Source 05/28/24 1317 Oral     SpO2  05/28/24 1317 98 %     Weight --      Height --      Head Circumference --      Peak Flow --      Pain Score 05/28/24 1315 8     Pain Loc --      Pain Education --      Exclude from Growth Chart --    No data found.  Updated Vital Signs BP (!) 142/96 (BP Location: Right Arm)   Pulse 77   Temp 98.1 F (36.7 C) (Oral)   Resp 18   LMP 05/23/2024 (Within Weeks)   SpO2 98%   Breastfeeding No   Visual Acuity Right Eye Distance:   Left Eye Distance:   Bilateral Distance:    Right Eye Near:   Left Eye Near:    Bilateral Near:     Physical Exam Vitals reviewed.  Constitutional:      General: She is awake. She is not in acute distress.    Appearance: Normal appearance. She is well-developed and well-groomed. She is not ill-appearing, toxic-appearing or diaphoretic.  HENT:     Head: Normocephalic and atraumatic.  Eyes:     General: Lids are normal. Gaze aligned appropriately.     Extraocular Movements: Extraocular movements intact.     Conjunctiva/sclera: Conjunctivae normal.  Cardiovascular:     Rate and Rhythm: Normal rate and regular rhythm.     Heart sounds: Normal heart sounds. No murmur heard.    No friction rub. No gallop.  Pulmonary:     Effort: Pulmonary effort is normal.     Breath sounds: Normal breath sounds. No decreased air movement. No decreased breath sounds, wheezing, rhonchi or rales.  Abdominal:     General: Abdomen is flat. Bowel sounds are decreased.     Palpations: Abdomen is soft.     Tenderness: There is abdominal tenderness in the right upper quadrant and epigastric area. Positive signs include Murphy's sign. Negative signs include McBurney's sign.  Neurological:     Mental Status: She is alert and oriented to person, place, and time.  Psychiatric:        Attention and Perception: Attention and perception normal.        Mood and Affect: Mood and affect normal.        Speech: Speech normal.        Behavior: Behavior normal. Behavior is  cooperative.  Thought Content: Thought content normal.        Judgment: Judgment normal.      UC Treatments / Results  Labs (all labs ordered are listed, but only abnormal results are displayed) Labs Reviewed  CBC WITH DIFFERENTIAL/PLATELET - Abnormal; Notable for the following components:      Result Value   MCV 77 (*)    MCH 23.6 (*)    MCHC 30.7 (*)    RDW 16.3 (*)    All other components within normal limits   Narrative:    Performed at:  [address] [place], [place]  [id number] Lab Director: Frankey Sas MD, Phone:  [phone]  COMPREHENSIVE METABOLIC PANEL WITH GFR - Abnormal; Notable for the following components:   BUN 4 (*)    BUN/Creatinine Ratio 6 (*)    Alkaline Phosphatase 195 (*)    ALT 36 (*)    All other components within normal limits   Narrative:    Performed at:  [address] [place], [place]  [id number] Lab Director: Frankey Sas MD, Phone:  [phone]  LIPASE   Narrative:    Performed at:  [address] [place] [address], [place], [place]  [id number] Lab Director: Frankey Sas MD, Phone:  [phone]    EKG   Radiology   Procedures Procedures (including critical care time)  Medications Ordered in UC Medications - No data to display  Initial Impression / Assessment and Plan / UC Course  I have reviewed the triage vital signs and the nursing notes.  Pertinent labs & imaging results that were available during my care of the patient were reviewed by me and considered in my medical decision making (see chart for details).      Final Clinical Impressions(s) / UC Diagnoses   Final diagnoses:  Right upper quadrant abdominal pain  Nausea and vomiting, unspecified vomiting type  Diarrhea, unspecified type   Patient presents today with concerns of abdominal symptoms such as severe pain when eating, vomiting, bloating and diarrhea.  She states that this has been ongoing  since roughly 05/16/2024 and seems to be becoming more persistent.  Physical exam is notable for abdominal tenderness in the right upper quadrant and epigastric region.  I reviewed with patient that her symptoms may be due to pancreatitis, cholelithiasis, choledocholithiasis, GERD.  To assist with evaluation will get CBC, CMP, lipase.  Will send patient home with famotidine  and Zofran  to provide symptomatic relief.  Reviewed with patient that her symptoms may be indicative of an acute abdomen and if she has worsening symptoms or new, severe symptoms she should go to the ER.  Patient voices agreement understanding.  Results of lab work to dictate further management.  If lipase is notably elevated I recommend evaluation in the ER for pancreatitis.  If CBC demonstrates elevated white count this may indicate cholangitis or choledocholithiasis and recommend evaluation in the ER as well.  Patient to follow-up with PCP if everything is negative for further evaluation and ongoing management.  Follow-up as needed.    Discharge Instructions      You were seen today for concerns of persistent right upper quadrant abdominal pain as well as nausea, vomiting, and diarrhea. There are several different conditions that can cause your symptoms and some of them can turn into an emergent situation.  At this time I am concerned that you may have some acid reflux but I cannot rule out an issue with your gallbladder or  pancreas.  We have conducted some labs to check for potential infection, electrolyte abnormalities and inflammation of your pancreas.  Those labs should return in the next 1 to 3 days and we will keep you updated with those results once they are available.  In the meantime I am sending you home with a prescription for famotidine  20 mg to be taken by mouth once per day and Zofran  4 mg to be taken up to every 8 hours for nausea and vomiting. The famotidine  helps to treat excess stomach acid which may help with some  of your nausea and vomiting.  The Zofran  helps with nausea and vomiting which will hopefully allow you to tolerate eating and drinking a little bit better. I do recommend that you follow-up with your primary care provider for ongoing management as you may need further evaluation and imaging going forward.  If you develop any of the following symptoms please go to the emergency room: Severe pain, persistent nausea and vomiting despite taking the Zofran , inability to eat or drink, fatigue or confusion, loss of consciousness, fevers that are not responding to Tylenol  and ibuprofen      ED Prescriptions     Medication Sig Dispense Auth. Provider   famotidine  (PEPCID ) 20 MG tablet Take 1 tablet (20 mg total) by mouth 2 (two) times daily. 30 tablet Mykal Kirchman E, PA-C   ondansetron  (ZOFRAN -ODT) 4 MG disintegrating tablet Take 1 tablet (4 mg total) by mouth every 8 (eight) hours as needed for nausea or vomiting. 20 tablet Aizza Santiago E, PA-C      PDMP not reviewed this encounter.     [1]  Social History Tobacco Use   Smoking status: Never   Smokeless tobacco: Never  Vaping Use   Vaping status: Former  Substance Use Topics   Alcohol use: Not Currently    Comment: social   Drug use: Not Currently    Types: Marijuana    Comment: before pregnancy     Remell Giaimo, Rocky BRAVO, PA-C 05/31/24 1059  "

## 2024-05-28 NOTE — Discharge Instructions (Signed)
 You were seen today for concerns of persistent right upper quadrant abdominal pain as well as nausea, vomiting, and diarrhea. There are several different conditions that can cause your symptoms and some of them can turn into an emergent situation.  At this time I am concerned that you may have some acid reflux but I cannot rule out an issue with your gallbladder or pancreas.  We have conducted some labs to check for potential infection, electrolyte abnormalities and inflammation of your pancreas.  Those labs should return in the next 1 to 3 days and we will keep you updated with those results once they are available.  In the meantime I am sending you home with a prescription for famotidine  20 mg to be taken by mouth once per day and Zofran  4 mg to be taken up to every 8 hours for nausea and vomiting. The famotidine  helps to treat excess stomach acid which may help with some of your nausea and vomiting.  The Zofran  helps with nausea and vomiting which will hopefully allow you to tolerate eating and drinking a little bit better. I do recommend that you follow-up with your primary care provider for ongoing management as you may need further evaluation and imaging going forward.  If you develop any of the following symptoms please go to the emergency room: Severe pain, persistent nausea and vomiting despite taking the Zofran , inability to eat or drink, fatigue or confusion, loss of consciousness, fevers that are not responding to Tylenol  and ibuprofen 

## 2024-05-28 NOTE — ED Notes (Signed)
 Attempted blood draw collection in [facility]. Unsuccessful.

## 2024-05-28 NOTE — ED Triage Notes (Signed)
 Pt presents with c/o nausea, vomiting, and diarrhea. Symptoms have been ongoing for approximately two weeks. Had the flu prior to symptoms starting. Currently rates overall pain an 8/10. Feeling pain in upper abdomen at this time. Describes as sharp, intermittent pains. OTC Tums taken for sxs/pain with temporary relief/improvement. Abdominal pain is only present with eating solid foods. Trying to increase her fluid intake.

## 2024-05-29 ENCOUNTER — Ambulatory Visit: Payer: Self-pay

## 2024-05-29 ENCOUNTER — Emergency Department (HOSPITAL_COMMUNITY)
Admission: EM | Admit: 2024-05-29 | Discharge: 2024-05-29 | Discharge status: Against Medical Advice | Attending: Emergency Medicine | Admitting: Emergency Medicine

## 2024-05-29 ENCOUNTER — Encounter (HOSPITAL_COMMUNITY): Payer: Self-pay

## 2024-05-29 ENCOUNTER — Emergency Department (HOSPITAL_COMMUNITY)

## 2024-05-29 ENCOUNTER — Other Ambulatory Visit: Payer: Self-pay

## 2024-05-29 DIAGNOSIS — R112 Nausea with vomiting, unspecified: Secondary | ICD-10-CM | POA: Diagnosis not present

## 2024-05-29 DIAGNOSIS — Z5321 Procedure and treatment not carried out due to patient leaving prior to being seen by health care provider: Secondary | ICD-10-CM | POA: Diagnosis not present

## 2024-05-29 DIAGNOSIS — R1084 Generalized abdominal pain: Secondary | ICD-10-CM | POA: Diagnosis not present

## 2024-05-29 DIAGNOSIS — R109 Unspecified abdominal pain: Secondary | ICD-10-CM | POA: Diagnosis present

## 2024-05-29 LAB — CBC WITH DIFFERENTIAL/PLATELET
Abs Immature Granulocytes: 0.02 K/uL (ref 0.00–0.07)
Basophils Absolute: 0 x10E3/uL (ref 0.0–0.2)
Basophils Absolute: 0 K/uL (ref 0.0–0.1)
Basophils Relative: 0 %
Basos: 0 %
EOS (ABSOLUTE): 0.1 x10E3/uL (ref 0.0–0.4)
Eos: 1 %
Eosinophils Absolute: 0.1 K/uL (ref 0.0–0.5)
Eosinophils Relative: 1 %
HCT: 34.1 % — ABNORMAL LOW (ref 36.0–46.0)
Hematocrit: 36.2 % (ref 34.0–46.6)
Hemoglobin: 11.1 g/dL (ref 11.1–15.9)
Hemoglobin: 10.7 g/dL — ABNORMAL LOW (ref 12.0–15.0)
Immature Grans (Abs): 0 x10E3/uL (ref 0.0–0.1)
Immature Granulocytes: 0 %
Immature Granulocytes: 0 %
Lymphocytes Absolute: 2.4 x10E3/uL (ref 0.7–3.1)
Lymphocytes Relative: 43 %
Lymphs Abs: 3 K/uL (ref 0.7–4.0)
Lymphs: 44 %
MCH: 23.6 pg — ABNORMAL LOW (ref 26.6–33.0)
MCH: 23.6 pg — ABNORMAL LOW (ref 26.0–34.0)
MCHC: 30.7 g/dL — ABNORMAL LOW (ref 31.5–35.7)
MCHC: 31.4 g/dL (ref 30.0–36.0)
MCV: 77 fL — ABNORMAL LOW (ref 79–97)
MCV: 75.3 fL — ABNORMAL LOW (ref 80.0–100.0)
Monocytes Absolute: 0.4 x10E3/uL (ref 0.1–0.9)
Monocytes Absolute: 0.6 K/uL (ref 0.1–1.0)
Monocytes Relative: 8 %
Monocytes: 7 %
Neutro Abs: 3.3 K/uL (ref 1.7–7.7)
Neutrophils Absolute: 2.6 x10E3/uL (ref 1.4–7.0)
Neutrophils Relative %: 48 %
Neutrophils: 48 %
Platelets: 275 x10E3/uL (ref 150–450)
Platelets: 177 K/uL (ref 150–400)
RBC: 4.7 x10E6/uL (ref 3.77–5.28)
RBC: 4.53 MIL/uL (ref 3.87–5.11)
RDW: 16.3 % — ABNORMAL HIGH (ref 11.7–15.4)
RDW: 15.9 % — ABNORMAL HIGH (ref 11.5–15.5)
WBC: 5.4 x10E3/uL (ref 3.4–10.8)
WBC: 7 K/uL (ref 4.0–10.5)
nRBC: 0 % (ref 0.0–0.2)

## 2024-05-29 LAB — COMPREHENSIVE METABOLIC PANEL WITH GFR
ALT: 36 IU/L — ABNORMAL HIGH (ref 0–32)
ALT: 37 U/L (ref 0–44)
AST: 31 IU/L (ref 0–40)
AST: 33 U/L (ref 15–41)
Albumin: 4.1 g/dL (ref 4.0–5.0)
Albumin: 3.9 g/dL (ref 3.5–5.0)
Alkaline Phosphatase: 195 IU/L — ABNORMAL HIGH (ref 41–116)
Alkaline Phosphatase: 197 U/L — ABNORMAL HIGH (ref 38–126)
Anion gap: 9 (ref 5–15)
BUN/Creatinine Ratio: 6 — ABNORMAL LOW (ref 9–23)
BUN: 4 mg/dL — ABNORMAL LOW (ref 6–20)
BUN: <5 mg/dL — ABNORMAL LOW (ref 6–20)
Bilirubin Total: 0.7 mg/dL (ref 0.0–1.2)
CO2: 23 mmol/L (ref 20–29)
CO2: 22 mmol/L (ref 22–32)
Calcium: 8.7 mg/dL (ref 8.7–10.2)
Calcium: 8.7 mg/dL — ABNORMAL LOW (ref 8.9–10.3)
Chloride: 106 mmol/L (ref 96–106)
Chloride: 106 mmol/L (ref 98–111)
Creatinine, Ser: 0.67 mg/dL (ref 0.57–1.00)
Creatinine, Ser: 0.73 mg/dL (ref 0.44–1.00)
GFR, Estimated: >60 mL/min
Globulin, Total: 2.9 g/dL (ref 1.5–4.5)
Glucose, Bld: 79 mg/dL (ref 70–99)
Glucose: 85 mg/dL (ref 70–99)
Potassium: 4.1 mmol/L (ref 3.5–5.2)
Potassium: 4 mmol/L (ref 3.5–5.1)
Sodium: 141 mmol/L (ref 134–144)
Sodium: 137 mmol/L (ref 135–145)
Total Bilirubin: 0.6 mg/dL (ref 0.0–1.2)
Total Protein: 7 g/dL (ref 6.0–8.5)
Total Protein: 7 g/dL (ref 6.5–8.1)
eGFR: 122 mL/min/1.73

## 2024-05-29 LAB — URINALYSIS, ROUTINE W REFLEX MICROSCOPIC
Bilirubin Urine: NEGATIVE
Glucose, UA: NEGATIVE mg/dL
Hgb urine dipstick: NEGATIVE
Ketones, ur: NEGATIVE mg/dL
Nitrite: NEGATIVE
Protein, ur: NEGATIVE mg/dL
Specific Gravity, Urine: 1.016 (ref 1.005–1.030)
pH: 7 (ref 5.0–8.0)

## 2024-05-29 LAB — LIPASE, BLOOD: Lipase: 22 U/L (ref 11–51)

## 2024-05-29 LAB — LIPASE: Lipase: 23 U/L (ref 14–72)

## 2024-05-29 LAB — PREGNANCY, URINE: Preg Test, Ur: NEGATIVE

## 2024-05-29 MED ORDER — IOHEXOL 350 MG/ML SOLN
75.0000 mL | Freq: Once | INTRAVENOUS | Status: AC | PRN
  Administered 2024-05-29: 75 mL via INTRAVENOUS

## 2024-05-29 MED ORDER — ONDANSETRON 4 MG PO TBDP
8.0000 mg | ORAL_TABLET | Freq: Once | ORAL | Status: AC
  Administered 2024-05-29: 8 mg via ORAL
  Filled 2024-05-29: qty 2

## 2024-05-29 NOTE — ED Notes (Signed)
 PT left AMA

## 2024-05-29 NOTE — ED Provider Triage Note (Signed)
 Emergency Medicine Provider Triage Evaluation Note  JALISE ZAWISTOWSKI , a 29 y.o. female  was evaluated in triage.  Pt complains of abdominal pain with nausea and vomiting.  Seen at urgent care yesterday.  Review of Systems  Positive: Abdominal pain, nausea, vomiting Negative: Fever, cough, chest pain, shortness of breath  Physical Exam  BP (!) 142/85 (BP Location: Right Arm)   Pulse 83   Temp 97.8 F (36.6 C)   Resp 18   Ht 5' 4 (1.626 m)   Wt 108.9 kg   LMP 05/23/2024 (Within Weeks)   SpO2 100%   BMI 41.20 kg/m  Gen:   Awake, no distress   Resp:  Normal effort  MSK:   Moves extremities without difficulty  Other:  Generalized abdominal pain  Medical Decision Making  Medically screening exam initiated at 6:57 PM.  Appropriate orders placed.  BLAYKLEE MABLE was informed that the remainder of the evaluation will be completed by another provider, this initial triage assessment does not replace that evaluation, and the importance of remaining in the ED until their evaluation is complete.  Labs and imaging ordered   Francis Ileana LOISE DEVONNA 05/29/24 1859

## 2024-05-29 NOTE — ED Triage Notes (Signed)
 Patient her with abdominal pain nausea and vomiting. She states she was seen at [facility] yesterday and was told to come to the ED if it continued or worse. She states that it is worse. Unsure if she has had any fevers.

## 2024-05-29 NOTE — ED Notes (Signed)
 Pt reported  staff leaving emergency department

## 2024-05-30 ENCOUNTER — Ambulatory Visit: Admitting: Family Medicine

## 2024-05-30 NOTE — ED Triage Notes (Signed)
 Patient arrives for abdominal pain. N/V and constipation. CT was done at [facility] which was negative however patient left ED d/t wait times.

## 2024-05-31 ENCOUNTER — Ambulatory Visit: Admitting: Obstetrics and Gynecology

## 2024-05-31 ENCOUNTER — Encounter: Payer: Self-pay | Admitting: Obstetrics and Gynecology

## 2024-05-31 VITALS — BP 145/103 | HR 65 | Ht 63.0 in | Wt 238.0 lb

## 2024-05-31 DIAGNOSIS — Z30432 Encounter for removal of intrauterine contraceptive device: Secondary | ICD-10-CM

## 2024-05-31 DIAGNOSIS — R109 Unspecified abdominal pain: Secondary | ICD-10-CM

## 2024-05-31 DIAGNOSIS — I10 Essential (primary) hypertension: Secondary | ICD-10-CM | POA: Diagnosis not present

## 2024-05-31 DIAGNOSIS — Z30011 Encounter for initial prescription of contraceptive pills: Secondary | ICD-10-CM

## 2024-05-31 MED ORDER — NORETHINDRONE 0.35 MG PO TABS: 1.0000 | ORAL_TABLET | Freq: Every day | ORAL | 11 refills | Status: AC

## 2024-05-31 MED ORDER — AMLODIPINE BESYLATE 5 MG PO TABS: 5.0000 mg | ORAL_TABLET | Freq: Every day | ORAL | 11 refills | Status: AC

## 2024-05-31 NOTE — Progress Notes (Signed)
" ° °  GYNECOLOGY PROGRESS NOTE  History:  29 y.o. G2P2002 presents to [facility] for abdominal pain. She went to the hospital yesterday due to pain, nausea, vomiting. She had a CT done and was told to see GI and start something for acid reflux. She plan to follow up with GI  Noted on her CT, her IUD was in lower segment and cervix. This is her second IUD that she has had and has had issues with it. She has had nexplanon before 2015-2020, had some side effects such as mood changes and weight gain.     The following portions of the patient's history were reviewed and updated as appropriate: allergies, current medications, past family history, past medical history, past social history, past surgical history and problem list. Last pap smear on 01/03/2023 was normal  Health Maintenance Due  Topic Date Due   COVID-19 Vaccine (1) Never done   FOOT EXAM  Never done   OPHTHALMOLOGY EXAM  Never done   Diabetic kidney evaluation - Urine ACR  Never done   Pneumococcal Vaccine (1 of 2 - PCV) Never done   Hepatitis B Vaccines 19-59 Average Risk (1 of 3 - 19+ 3-dose series) Never done   HPV VACCINES (1 - Risk 3-dose SCDM series) Never done   HEMOGLOBIN A1C  10/31/2023   Influenza Vaccine  12/22/2023     Review of Systems:  Pertinent items are noted in HPI.   Objective:  Physical Exam Blood pressure (!) 137/90, pulse 76, height 5' 3 (1.6 m), weight 238 lb (108 kg), last menstrual period 05/23/2024, not currently breastfeeding. VS reviewed, nursing note reviewed,  Constitutional: well developed, well nourished, no distress HEENT: normocephalic Pulm/chest wall: normal effort Abdomen: non distended Neuro: alert and oriented  Skin: warm, dry Psych: affect normal Pelvic exam: Cervix pink, visually closed, without lesion, scant white creamy discharge, vaginal walls and external genitalia normal. IUD noted to be extending out from cervix    IUD Removal  Patient identified, informed consent  performed, consent signed.  Patient was in the dorsal lithotomy position, normal external genitalia was noted.  A speculum was placed in the patient's vagina, normal discharge was noted, no lesions. The cervix was visualized, no lesions, no abnormal discharge.  The strings of the IUD were grasped and pulled using ring forceps. The IUD was removed in its entirety.    Assessment & Plan:  1. Encounter for IUD removal (Primary) See removal note above  2. Encounter for initial prescription of contraceptive pills Discussed different options for birth control to include, pills, injection, implant, and IUD. At this time she would like to try POPs. Rx sent to pharmacy, discussed missed pill window, no unprotected x 1 week   3. Abdominal pain, unspecified abdominal location Negative CT, planning to follow up with GI  IUD removed today   4. Hypertension, unspecified type Start norvasc , check BPs at home  - amLODipine  (NORVASC ) 5 MG tablet; Take 1 tablet (5 mg total) by mouth daily.  Dispense: 30 tablet; Refill: 11   Return in about 1 year (around 05/31/2025) for [place] Nidia Delores, FNP 10:59 AM "
